# Patient Record
Sex: Male | Born: 1955 | Race: White | Hispanic: No | Marital: Married | Smoking: Current every day smoker
Health system: Southern US, Community
[De-identification: ages and names within clinical notes are randomized; demographics above are authoritative.]

## PROBLEM LIST (undated history)

## (undated) DIAGNOSIS — I10 Essential (primary) hypertension: Secondary | ICD-10-CM

## (undated) DIAGNOSIS — Z992 Dependence on renal dialysis: Secondary | ICD-10-CM

## (undated) DIAGNOSIS — C329 Malignant neoplasm of larynx, unspecified: Secondary | ICD-10-CM

## (undated) DIAGNOSIS — N186 End stage renal disease: Secondary | ICD-10-CM

## (undated) DIAGNOSIS — J449 Chronic obstructive pulmonary disease, unspecified: Secondary | ICD-10-CM

---

## 2018-04-19 ENCOUNTER — Inpatient Hospital Stay
Admission: AD | Admit: 2018-04-19 | Discharge: 2018-04-19 | Payer: Self-pay | Source: Ambulatory Visit | Attending: Internal Medicine | Admitting: Internal Medicine

## 2018-04-19 ENCOUNTER — Inpatient Hospital Stay (HOSPITAL_COMMUNITY)
Admission: EM | Admit: 2018-04-19 | Discharge: 2018-04-22 | DRG: 004 | Disposition: A | Discharge status: Other Acute Inpatient Hospital | Payer: Medicare Other | Source: Other Acute Inpatient Hospital | Attending: Pulmonary Disease | Admitting: Pulmonary Disease

## 2018-04-19 ENCOUNTER — Other Ambulatory Visit (HOSPITAL_COMMUNITY): Payer: Self-pay

## 2018-04-19 ENCOUNTER — Encounter (HOSPITAL_COMMUNITY)
Admission: EM | Disposition: A | Discharge status: Nursing Home (Includes ICF) | Payer: Self-pay | Source: Other Acute Inpatient Hospital | Attending: Critical Care Medicine

## 2018-04-19 ENCOUNTER — Emergency Department (HOSPITAL_COMMUNITY): Payer: Medicare Other | Admitting: Anesthesiology

## 2018-04-19 DIAGNOSIS — L8990 Pressure ulcer of unspecified site, unspecified stage: Secondary | ICD-10-CM

## 2018-04-19 DIAGNOSIS — E872 Acidosis: Secondary | ICD-10-CM | POA: Diagnosis present

## 2018-04-19 DIAGNOSIS — D631 Anemia in chronic kidney disease: Secondary | ICD-10-CM | POA: Diagnosis present

## 2018-04-19 DIAGNOSIS — G9340 Encephalopathy, unspecified: Secondary | ICD-10-CM

## 2018-04-19 DIAGNOSIS — Z992 Dependence on renal dialysis: Secondary | ICD-10-CM

## 2018-04-19 DIAGNOSIS — F17210 Nicotine dependence, cigarettes, uncomplicated: Secondary | ICD-10-CM | POA: Diagnosis present

## 2018-04-19 DIAGNOSIS — J440 Chronic obstructive pulmonary disease with acute lower respiratory infection: Secondary | ICD-10-CM | POA: Diagnosis present

## 2018-04-19 DIAGNOSIS — Z8521 Personal history of malignant neoplasm of larynx: Secondary | ICD-10-CM

## 2018-04-19 DIAGNOSIS — J441 Chronic obstructive pulmonary disease with (acute) exacerbation: Secondary | ICD-10-CM | POA: Diagnosis present

## 2018-04-19 DIAGNOSIS — Z781 Physical restraint status: Secondary | ICD-10-CM

## 2018-04-19 DIAGNOSIS — J42 Unspecified chronic bronchitis: Secondary | ICD-10-CM

## 2018-04-19 DIAGNOSIS — J9600 Acute respiratory failure, unspecified whether with hypoxia or hypercapnia: Secondary | ICD-10-CM

## 2018-04-19 DIAGNOSIS — Z882 Allergy status to sulfonamides status: Secondary | ICD-10-CM

## 2018-04-19 DIAGNOSIS — J9621 Acute and chronic respiratory failure with hypoxia: Principal | ICD-10-CM | POA: Diagnosis present

## 2018-04-19 DIAGNOSIS — J9622 Acute and chronic respiratory failure with hypercapnia: Secondary | ICD-10-CM | POA: Diagnosis present

## 2018-04-19 DIAGNOSIS — R410 Disorientation, unspecified: Secondary | ICD-10-CM | POA: Diagnosis not present

## 2018-04-19 DIAGNOSIS — N186 End stage renal disease: Secondary | ICD-10-CM

## 2018-04-19 DIAGNOSIS — R1310 Dysphagia, unspecified: Secondary | ICD-10-CM | POA: Diagnosis present

## 2018-04-19 DIAGNOSIS — J9601 Acute respiratory failure with hypoxia: Secondary | ICD-10-CM | POA: Diagnosis present

## 2018-04-19 DIAGNOSIS — Z931 Gastrostomy status: Secondary | ICD-10-CM

## 2018-04-19 DIAGNOSIS — J9690 Respiratory failure, unspecified, unspecified whether with hypoxia or hypercapnia: Secondary | ICD-10-CM | POA: Diagnosis present

## 2018-04-19 DIAGNOSIS — I4891 Unspecified atrial fibrillation: Secondary | ICD-10-CM | POA: Diagnosis present

## 2018-04-19 DIAGNOSIS — J9602 Acute respiratory failure with hypercapnia: Secondary | ICD-10-CM

## 2018-04-19 DIAGNOSIS — Z888 Allergy status to other drugs, medicaments and biological substances status: Secondary | ICD-10-CM

## 2018-04-19 DIAGNOSIS — Z930 Tracheostomy status: Secondary | ICD-10-CM

## 2018-04-19 DIAGNOSIS — B9789 Other viral agents as the cause of diseases classified elsewhere: Secondary | ICD-10-CM | POA: Diagnosis present

## 2018-04-19 DIAGNOSIS — Z923 Personal history of irradiation: Secondary | ICD-10-CM

## 2018-04-19 HISTORY — DX: Malignant neoplasm of larynx, unspecified: C32.9

## 2018-04-19 HISTORY — DX: Chronic obstructive pulmonary disease, unspecified: J44.9

## 2018-04-19 HISTORY — DX: End stage renal disease: N18.6

## 2018-04-19 HISTORY — DX: Essential (primary) hypertension: I10

## 2018-04-19 HISTORY — PX: TRACHEOSTOMY TUBE PLACEMENT: SHX814

## 2018-04-19 HISTORY — DX: Dependence on renal dialysis: Z99.2

## 2018-04-19 LAB — CBC WITH DIFFERENTIAL/PLATELET
ABS IMMATURE GRANULOCYTES: 0.62 10*3/uL — AB (ref 0.00–0.07)
BASOS PCT: 0 %
Basophils Absolute: 0 10*3/uL (ref 0.0–0.1)
Eosinophils Absolute: 0 10*3/uL (ref 0.0–0.5)
Eosinophils Relative: 0 %
HCT: 40 % (ref 39.0–52.0)
Hemoglobin: 12.6 g/dL — ABNORMAL LOW (ref 13.0–17.0)
IMMATURE GRANULOCYTES: 3 %
LYMPHS PCT: 2 %
Lymphs Abs: 0.5 10*3/uL — ABNORMAL LOW (ref 0.7–4.0)
MCH: 29.1 pg (ref 26.0–34.0)
MCHC: 31.5 g/dL (ref 30.0–36.0)
MCV: 92.4 fL (ref 80.0–100.0)
MONOS PCT: 6 %
Monocytes Absolute: 1.4 10*3/uL — ABNORMAL HIGH (ref 0.1–1.0)
NEUTROS ABS: 21.3 10*3/uL — AB (ref 1.7–7.7)
Neutrophils Relative %: 89 %
PLATELETS: 200 10*3/uL (ref 150–400)
RBC: 4.33 MIL/uL (ref 4.22–5.81)
RDW: 12.6 % (ref 11.5–15.5)
WBC: 23.9 10*3/uL — AB (ref 4.0–10.5)
nRBC: 0 % (ref 0.0–0.2)

## 2018-04-19 LAB — BLOOD GAS, ARTERIAL
Acid-base deficit: 2.8 mmol/L — ABNORMAL HIGH (ref 0.0–2.0)
Bicarbonate: 21.9 mmol/L (ref 20.0–28.0)
O2 CONTENT: 3 L/min
O2 Saturation: 93.5 %
PATIENT TEMPERATURE: 98.6
pCO2 arterial: 40.6 mmHg (ref 32.0–48.0)
pH, Arterial: 7.351 (ref 7.350–7.450)
pO2, Arterial: 78.3 mmHg — ABNORMAL LOW (ref 83.0–108.0)

## 2018-04-19 LAB — PROTIME-INR
INR: 1.08
PROTHROMBIN TIME: 13.9 s (ref 11.4–15.2)

## 2018-04-19 LAB — COMPREHENSIVE METABOLIC PANEL
ALT: 81 U/L — AB (ref 0–44)
AST: 34 U/L (ref 15–41)
Albumin: 2.6 g/dL — ABNORMAL LOW (ref 3.5–5.0)
Alkaline Phosphatase: 74 U/L (ref 38–126)
Anion gap: 19 — ABNORMAL HIGH (ref 5–15)
BUN: 160 mg/dL — ABNORMAL HIGH (ref 6–20)
CALCIUM: 8.7 mg/dL — AB (ref 8.9–10.3)
CHLORIDE: 95 mmol/L — AB (ref 98–111)
CO2: 22 mmol/L (ref 22–32)
CREATININE: 8.58 mg/dL — AB (ref 0.61–1.24)
GFR calc Af Amer: 7 mL/min — ABNORMAL LOW (ref >60)
GFR calc non Af Amer: 6 mL/min — ABNORMAL LOW (ref >60)
Glucose, Bld: 93 mg/dL (ref 70–99)
Potassium: 5.1 mmol/L (ref 3.5–5.1)
Sodium: 136 mmol/L (ref 135–145)
Total Bilirubin: 1.2 mg/dL (ref 0.3–1.2)
Total Protein: 6.1 g/dL — ABNORMAL LOW (ref 6.5–8.1)

## 2018-04-19 SURGERY — CREATION, TRACHEOSTOMY
Anesthesia: Monitor Anesthesia Care

## 2018-04-19 MED ORDER — KETAMINE HCL 50 MG/5ML IJ SOSY
80.0000 mg | PREFILLED_SYRINGE | Freq: Once | INTRAMUSCULAR | Status: DC
  Filled 2018-04-19: qty 10

## 2018-04-19 MED ORDER — LIDOCAINE HCL (PF) 1 % IJ SOLN
INTRAMUSCULAR | Status: AC
  Filled 2018-04-19: qty 30

## 2018-04-19 MED ORDER — LIDOCAINE-EPINEPHRINE 1 %-1:100000 IJ SOLN
INTRAMUSCULAR | Status: AC
  Filled 2018-04-19: qty 1

## 2018-04-19 MED ORDER — IOPAMIDOL (ISOVUE-300) INJECTION 61%
INTRAVENOUS | Status: AC
  Administered 2018-04-19: 50 mL via GASTROSTOMY
  Filled 2018-04-19: qty 50

## 2018-04-19 SURGICAL SUPPLY — 46 items
BLADE CLIPPER SURG (BLADE) IMPLANT
BLADE SURG 15 STRL LF DISP TIS (BLADE) IMPLANT
BLADE SURG 15 STRL SS (BLADE)
CANISTER SUCT 3000ML PPV (MISCELLANEOUS) ×2 IMPLANT
CLEANER TIP ELECTROSURG 2X2 (MISCELLANEOUS) ×2 IMPLANT
COVER SURGICAL LIGHT HANDLE (MISCELLANEOUS) ×2 IMPLANT
COVER WAND RF STERILE (DRAPES) ×2 IMPLANT
CRADLE DONUT ADULT HEAD (MISCELLANEOUS) IMPLANT
DECANTER SPIKE VIAL GLASS SM (MISCELLANEOUS) IMPLANT
DRAPE HALF SHEET 40X57 (DRAPES) IMPLANT
DRSG ADAPTIC 3X8 NADH LF (GAUZE/BANDAGES/DRESSINGS) ×2 IMPLANT
ELECT COATED BLADE 2.86 ST (ELECTRODE) ×2 IMPLANT
ELECT REM PT RETURN 9FT ADLT (ELECTROSURGICAL) ×2
ELECTRODE REM PT RTRN 9FT ADLT (ELECTROSURGICAL) ×1 IMPLANT
GAUZE 4X4 16PLY RFD (DISPOSABLE) ×2 IMPLANT
GAUZE PETROLATUM 1 X8 (GAUZE/BANDAGES/DRESSINGS) IMPLANT
GLOVE BIO SURGEON STRL SZ 6.5 (GLOVE) ×6 IMPLANT
GLOVE BIO SURGEON STRL SZ7 (GLOVE) ×2 IMPLANT
GLOVE BIO SURGEON STRL SZ7.5 (GLOVE) ×2 IMPLANT
GLOVE BIOGEL PI IND STRL 6.5 (GLOVE) ×2 IMPLANT
GLOVE BIOGEL PI INDICATOR 6.5 (GLOVE) ×2
GOWN STRL REUS W/ TWL LRG LVL3 (GOWN DISPOSABLE) ×6 IMPLANT
GOWN STRL REUS W/TWL LRG LVL3 (GOWN DISPOSABLE) ×6
HOLDER TRACH TUBE VELCRO 19.5 (MISCELLANEOUS) ×2 IMPLANT
KIT BASIN OR (CUSTOM PROCEDURE TRAY) ×2 IMPLANT
KIT SUCTION CATH 14FR (SUCTIONS) IMPLANT
KIT TURNOVER KIT B (KITS) ×2 IMPLANT
NEEDLE HYPO 25GX1X1/2 BEV (NEEDLE) ×4 IMPLANT
NS IRRIG 1000ML POUR BTL (IV SOLUTION) ×2 IMPLANT
PAD ARMBOARD 7.5X6 YLW CONV (MISCELLANEOUS) ×4 IMPLANT
PENCIL BUTTON HOLSTER BLD 10FT (ELECTRODE) ×4 IMPLANT
SPONGE DRAIN TRACH 4X4 STRL 2S (GAUZE/BANDAGES/DRESSINGS) ×2 IMPLANT
SPONGE INTESTINAL PEANUT (DISPOSABLE) IMPLANT
SUT SILK 0 FSL (SUTURE) ×4 IMPLANT
SUT SILK 2 0 (SUTURE) ×1
SUT SILK 2 0 REEL (SUTURE) IMPLANT
SUT SILK 2 0 SH CR/8 (SUTURE) IMPLANT
SUT SILK 2-0 18XBRD TIE 12 (SUTURE) ×1 IMPLANT
SUT SILK 3 0 SH 30 (SUTURE) ×2 IMPLANT
SUT VIC AB 2-0 FS1 27 (SUTURE) IMPLANT
SYR 20ML ECCENTRIC (SYRINGE) ×2 IMPLANT
SYR BULB 3OZ (MISCELLANEOUS) ×2 IMPLANT
SYR CONTROL 10ML LL (SYRINGE) ×6 IMPLANT
TRAY ENT MC OR (CUSTOM PROCEDURE TRAY) ×2 IMPLANT
TUBE CONNECTING 12X1/4 (SUCTIONS) ×2 IMPLANT
WATER STERILE IRR 1000ML POUR (IV SOLUTION) ×2 IMPLANT

## 2018-04-19 NOTE — ED Provider Notes (Signed)
[facility] EMERGENCY DEPARTMENT Provider Note   CSN: [id number] Arrival date & time: 04/19/18  2325     History   Chief Complaint No chief complaint on file.   HPI Jimmy Martin is a 62 y.o. male.  HPI  This is a 62 year old male with a history of respiratory failure, laryngeal cancer, COPD based on chart review who presents with respiratory distress.  Patient was sent down to the ED from [facility].  Concern for worsening dyspnea and shortness of breath.  Anesthesia was called to the bedside but felt that intubation at the bedside without ENT back-up for possible chronic or tracheostomy was unfavorable.  Patient was sent to the ED for evaluation.  ENT and trauma surgery are at the bedside at this time.  Patient is arousable.  Current O2 sats are 100%.  He does endorse that he would want intubation.  I have reviewed his record and he was a transfer from an outside hospital.  He was intubated for weeks ago at the outside hospital.  Patient is unable to contribute to history at this time.  Level 5 caveat.  Past medical history -COPD, laryngeal cancer    There are no active problems to display for this patient.  Unknown medicines  Home Medications    Prior to Admission medications   Not on File    Family History No family history on file.  Social History Social History   Tobacco Use  . Smoking status: Not on file  Substance Use Topics  . Alcohol use: Not on file  . Drug use: Not on file     Allergies   Patient has no allergy information on record.   Review of Systems Review of Systems  Unable to perform ROS: Acuity of condition     Physical Exam Updated Vital Signs There were no vitals taken for this visit.  Physical Exam  Constitutional:  Chronically ill-appearing  HENT:  Head: Normocephalic and atraumatic.  Eyes: Pupils are equal, round, and reactive to light.  Cardiovascular: Normal rate, regular rhythm and normal heart sounds.    Pulmonary/Chest: He is in respiratory distress. He has no wheezes.  Increased respiratory rate, somnolent but arousable, minimal air movement  Abdominal: Soft. There is no tenderness.  Neurological:  Somnolent but arousable  Skin: Skin is warm and dry.  Psychiatric: He has a normal mood and affect.  Nursing note and vitals reviewed.    ED Treatments / Results  Labs (all labs ordered are listed, but only abnormal results are displayed) Labs Reviewed - No data to display  EKG None  Radiology Dg Chest [facility] 1 View  Result Date: 04/19/2018 CLINICAL DATA:  Dialysis catheter. EXAM: PORTABLE ABDOMEN - 1 VIEW; PORTABLE CHEST - 1 VIEW COMPARISON:  None. FINDINGS: Cardiomediastinal silhouette is normal. Mildly calcified aortic arch. No pleural effusions or focal consolidations. Tunneled dialysis catheter via RIGHT internal jugular venous approach with distal tip projecting in distal superior vena cava. Tiny suspected granulomas. Trachea projects midline and there is no pneumothorax. Soft tissue planes and included osseous structures are non-suspicious. Contrast injected into gastrostomy tube with contrast distended gastric antrum, no extraluminal contrast. Included bowel gas pattern is nondilated and nonobstructive. Bilateral nephroureteral stents. No mass effect or nephrolithiasis. Included osseous structures are non suspicious. IMPRESSION: 1. No acute cardiopulmonary process. Dialysis catheter tip projects in distal superior vena cava. 2. Intraluminal gastrostomy tube.  Bilateral nephroureteral stents. Electronically Signed   By: Elon Alas M.D.   On: 04/19/2018  19:01   Dg Abd Portable 1v  Result Date: 04/19/2018 CLINICAL DATA:  Dialysis catheter. EXAM: PORTABLE ABDOMEN - 1 VIEW; PORTABLE CHEST - 1 VIEW COMPARISON:  None. FINDINGS: Cardiomediastinal silhouette is normal. Mildly calcified aortic arch. No pleural effusions or focal consolidations. Tunneled dialysis catheter via RIGHT  internal jugular venous approach with distal tip projecting in distal superior vena cava. Tiny suspected granulomas. Trachea projects midline and there is no pneumothorax. Soft tissue planes and included osseous structures are non-suspicious. Contrast injected into gastrostomy tube with contrast distended gastric antrum, no extraluminal contrast. Included bowel gas pattern is nondilated and nonobstructive. Bilateral nephroureteral stents. No mass effect or nephrolithiasis. Included osseous structures are non suspicious. IMPRESSION: 1. No acute cardiopulmonary process. Dialysis catheter tip projects in distal superior vena cava. 2. Intraluminal gastrostomy tube.  Bilateral nephroureteral stents. Electronically Signed   By: Elon Alas M.D.   On: 04/19/2018 19:01    Procedures Procedures (including critical care time)  CRITICAL CARE Performed by: Merryl Hacker   Total critical care time: 31 minutes  Critical care time was exclusive of separately billable procedures and treating other patients.  Critical care was necessary to treat or prevent imminent or life-threatening deterioration.  Critical care was time spent personally by me on the following activities: development of treatment plan with patient and/or surrogate as well as nursing, discussions with consultants, evaluation of patient's response to treatment, examination of patient, obtaining history from patient or surrogate, ordering and performing treatments and interventions, ordering and review of laboratory studies, ordering and review of radiographic studies, pulse oximetry and re-evaluation of patient's condition.   Medications Ordered in ED Medications  ketamine 50 mg in normal saline 5 mL (10 mg/mL) syringe (has no administration in time range)     Initial Impression / Assessment and Plan / ED Course  I have reviewed the triage vital signs and the nursing notes.  Pertinent labs & imaging results that were available  during my care of the patient were reviewed by me and considered in my medical decision making (see chart for details).     [facility] with concerns for needing airway.  Currently his O2 sats are 100%.  He is somnolent but arousable.  ENT and trauma or at the bedside for assessment.  Anesthesia is also at the bedside.  Decision was made to take patient emergently to the OR for tracheostomy given his history of laryngeal cancer.  Final Clinical Impressions(s) / ED Diagnoses   Final diagnoses:  Acute respiratory failure, unspecified whether with hypoxia or hypercapnia [facility])    ED Discharge Orders    None       Merryl Hacker, MD 04/19/18 2345

## 2018-04-20 ENCOUNTER — Inpatient Hospital Stay (HOSPITAL_COMMUNITY): Payer: Medicare Other

## 2018-04-20 ENCOUNTER — Encounter (HOSPITAL_COMMUNITY): Payer: Self-pay | Admitting: Pulmonary Disease

## 2018-04-20 DIAGNOSIS — J9601 Acute respiratory failure with hypoxia: Secondary | ICD-10-CM | POA: Diagnosis present

## 2018-04-20 DIAGNOSIS — R1310 Dysphagia, unspecified: Secondary | ICD-10-CM | POA: Diagnosis present

## 2018-04-20 DIAGNOSIS — Z8521 Personal history of malignant neoplasm of larynx: Secondary | ICD-10-CM | POA: Diagnosis not present

## 2018-04-20 DIAGNOSIS — B9789 Other viral agents as the cause of diseases classified elsewhere: Secondary | ICD-10-CM | POA: Diagnosis present

## 2018-04-20 DIAGNOSIS — J9622 Acute and chronic respiratory failure with hypercapnia: Secondary | ICD-10-CM | POA: Diagnosis present

## 2018-04-20 DIAGNOSIS — R410 Disorientation, unspecified: Secondary | ICD-10-CM | POA: Diagnosis not present

## 2018-04-20 DIAGNOSIS — D631 Anemia in chronic kidney disease: Secondary | ICD-10-CM | POA: Diagnosis present

## 2018-04-20 DIAGNOSIS — E872 Acidosis: Secondary | ICD-10-CM | POA: Diagnosis present

## 2018-04-20 DIAGNOSIS — Z930 Tracheostomy status: Secondary | ICD-10-CM | POA: Diagnosis not present

## 2018-04-20 DIAGNOSIS — G9341 Metabolic encephalopathy: Secondary | ICD-10-CM | POA: Diagnosis not present

## 2018-04-20 DIAGNOSIS — J189 Pneumonia, unspecified organism: Secondary | ICD-10-CM | POA: Diagnosis not present

## 2018-04-20 DIAGNOSIS — Z882 Allergy status to sulfonamides status: Secondary | ICD-10-CM | POA: Diagnosis not present

## 2018-04-20 DIAGNOSIS — Z931 Gastrostomy status: Secondary | ICD-10-CM | POA: Diagnosis not present

## 2018-04-20 DIAGNOSIS — J9600 Acute respiratory failure, unspecified whether with hypoxia or hypercapnia: Secondary | ICD-10-CM | POA: Diagnosis present

## 2018-04-20 DIAGNOSIS — I4891 Unspecified atrial fibrillation: Secondary | ICD-10-CM | POA: Diagnosis present

## 2018-04-20 DIAGNOSIS — J9621 Acute and chronic respiratory failure with hypoxia: Secondary | ICD-10-CM | POA: Diagnosis present

## 2018-04-20 DIAGNOSIS — L8990 Pressure ulcer of unspecified site, unspecified stage: Secondary | ICD-10-CM

## 2018-04-20 DIAGNOSIS — J9602 Acute respiratory failure with hypercapnia: Secondary | ICD-10-CM

## 2018-04-20 DIAGNOSIS — J9690 Respiratory failure, unspecified, unspecified whether with hypoxia or hypercapnia: Secondary | ICD-10-CM | POA: Diagnosis present

## 2018-04-20 DIAGNOSIS — Z9911 Dependence on respirator [ventilator] status: Secondary | ICD-10-CM | POA: Diagnosis not present

## 2018-04-20 DIAGNOSIS — J440 Chronic obstructive pulmonary disease with acute lower respiratory infection: Secondary | ICD-10-CM | POA: Diagnosis present

## 2018-04-20 DIAGNOSIS — Z781 Physical restraint status: Secondary | ICD-10-CM | POA: Diagnosis not present

## 2018-04-20 DIAGNOSIS — Z992 Dependence on renal dialysis: Secondary | ICD-10-CM | POA: Diagnosis not present

## 2018-04-20 DIAGNOSIS — G9340 Encephalopathy, unspecified: Secondary | ICD-10-CM | POA: Diagnosis not present

## 2018-04-20 DIAGNOSIS — J441 Chronic obstructive pulmonary disease with (acute) exacerbation: Secondary | ICD-10-CM | POA: Diagnosis present

## 2018-04-20 DIAGNOSIS — Z923 Personal history of irradiation: Secondary | ICD-10-CM | POA: Diagnosis not present

## 2018-04-20 DIAGNOSIS — F17210 Nicotine dependence, cigarettes, uncomplicated: Secondary | ICD-10-CM | POA: Diagnosis present

## 2018-04-20 DIAGNOSIS — Z888 Allergy status to other drugs, medicaments and biological substances status: Secondary | ICD-10-CM | POA: Diagnosis not present

## 2018-04-20 DIAGNOSIS — L7682 Other postprocedural complications of skin and subcutaneous tissue: Secondary | ICD-10-CM | POA: Diagnosis not present

## 2018-04-20 DIAGNOSIS — N186 End stage renal disease: Secondary | ICD-10-CM | POA: Diagnosis present

## 2018-04-20 LAB — RESPIRATORY PANEL BY PCR

## 2018-04-20 LAB — LACTIC ACID, PLASMA
LACTIC ACID, VENOUS: 1.1 mmol/L (ref 0.5–1.9)
Lactic Acid, Venous: 0.8 mmol/L (ref 0.5–1.9)

## 2018-04-20 LAB — MRSA PCR SCREENING: MRSA by PCR: NEGATIVE

## 2018-04-20 LAB — GLUCOSE, CAPILLARY
GLUCOSE-CAPILLARY: 103 mg/dL — AB (ref 70–99)
GLUCOSE-CAPILLARY: 110 mg/dL — AB (ref 70–99)
GLUCOSE-CAPILLARY: 129 mg/dL — AB (ref 70–99)
GLUCOSE-CAPILLARY: 132 mg/dL — AB (ref 70–99)
GLUCOSE-CAPILLARY: 96 mg/dL (ref 70–99)
Glucose-Capillary: 93 mg/dL (ref 70–99)
Glucose-Capillary: 95 mg/dL (ref 70–99)

## 2018-04-20 LAB — CBC
HEMATOCRIT: 38.8 % — AB (ref 39.0–52.0)
HEMATOCRIT: 37 % — AB (ref 39.0–52.0)
HEMOGLOBIN: 12.1 g/dL — AB (ref 13.0–17.0)
HEMOGLOBIN: 11.5 g/dL — AB (ref 13.0–17.0)
MCH: 29.4 pg (ref 26.0–34.0)
MCH: 29.5 pg (ref 26.0–34.0)
MCHC: 31.2 g/dL (ref 30.0–36.0)
MCHC: 31.1 g/dL (ref 30.0–36.0)
MCV: 94.2 fL (ref 80.0–100.0)
MCV: 94.9 fL (ref 80.0–100.0)
NRBC: 0 % (ref 0.0–0.2)
Platelets: 176 10*3/uL (ref 150–400)
Platelets: 152 10*3/uL (ref 150–400)
RBC: 4.12 MIL/uL — ABNORMAL LOW (ref 4.22–5.81)
RBC: 3.9 MIL/uL — AB (ref 4.22–5.81)
RDW: 12.8 % (ref 11.5–15.5)
RDW: 12.8 % (ref 11.5–15.5)
WBC: 22.4 10*3/uL — ABNORMAL HIGH (ref 4.0–10.5)
WBC: 18.8 10*3/uL — ABNORMAL HIGH (ref 4.0–10.5)
nRBC: 0 % (ref 0.0–0.2)

## 2018-04-20 LAB — BASIC METABOLIC PANEL
Anion gap: 21 — ABNORMAL HIGH (ref 5–15)
BUN: 183 mg/dL — ABNORMAL HIGH (ref 6–20)
CALCIUM: 8.5 mg/dL — AB (ref 8.9–10.3)
CO2: 20 mmol/L — AB (ref 22–32)
CREATININE: 9.35 mg/dL — AB (ref 0.61–1.24)
Chloride: 94 mmol/L — ABNORMAL LOW (ref 98–111)
GFR calc non Af Amer: 5 mL/min — ABNORMAL LOW (ref >60)
GFR, EST AFRICAN AMERICAN: 6 mL/min — AB (ref >60)
GLUCOSE: 117 mg/dL — AB (ref 70–99)
Potassium: 6.2 mmol/L — ABNORMAL HIGH (ref 3.5–5.1)
Sodium: 135 mmol/L (ref 135–145)

## 2018-04-20 LAB — BLOOD GAS, ARTERIAL
ACID-BASE DEFICIT: 6.2 mmol/L — AB (ref 0.0–2.0)
Bicarbonate: 20.3 mmol/L (ref 20.0–28.0)
Drawn by: 51155
FIO2: 80
O2 SAT: 98.8 %
PATIENT TEMPERATURE: 98.6
PEEP: 5 cmH2O
PH ART: 7.215 — AB (ref 7.350–7.450)
RATE: 14 resp/min
VT: 520 mL
pCO2 arterial: 52.2 mmHg — ABNORMAL HIGH (ref 32.0–48.0)
pO2, Arterial: 342 mmHg — ABNORMAL HIGH (ref 83.0–108.0)

## 2018-04-20 LAB — POCT I-STAT 3, ART BLOOD GAS (G3+)
Acid-base deficit: 5 mmol/L — ABNORMAL HIGH (ref 0.0–2.0)
BICARBONATE: 22.1 mmol/L (ref 20.0–28.0)
O2 Saturation: 100 %
TCO2: 24 mmol/L (ref 22–32)
pCO2 arterial: 49.2 mmHg — ABNORMAL HIGH (ref 32.0–48.0)
pH, Arterial: 7.261 — ABNORMAL LOW (ref 7.350–7.450)
pO2, Arterial: 357 mmHg — ABNORMAL HIGH (ref 83.0–108.0)

## 2018-04-20 LAB — MAGNESIUM
MAGNESIUM: 3.1 mg/dL — AB (ref 1.7–2.4)
Magnesium: 3.4 mg/dL — ABNORMAL HIGH (ref 1.7–2.4)

## 2018-04-20 LAB — CREATININE, SERUM
Creatinine, Ser: 9.16 mg/dL — ABNORMAL HIGH (ref 0.61–1.24)
GFR calc Af Amer: 6 mL/min — ABNORMAL LOW (ref >60)
GFR calc non Af Amer: 5 mL/min — ABNORMAL LOW (ref >60)

## 2018-04-20 LAB — PHOSPHORUS
PHOSPHORUS: 18 mg/dL — AB (ref 2.5–4.6)
PHOSPHORUS: 18.6 mg/dL — AB (ref 2.5–4.6)

## 2018-04-20 LAB — HIV ANTIBODY (ROUTINE TESTING W REFLEX): HIV Screen 4th Generation wRfx: NONREACTIVE

## 2018-04-20 MED ORDER — LIDOCAINE-EPINEPHRINE 1 %-1:100000 IJ SOLN
INTRAMUSCULAR | Status: DC | PRN
  Administered 2018-04-20: 7 mL

## 2018-04-20 MED ORDER — MIDAZOLAM HCL 2 MG/2ML IJ SOLN
1.0000 mg | Freq: Once | INTRAMUSCULAR | Status: AC
  Administered 2018-04-20: 1 mg via INTRAVENOUS

## 2018-04-20 MED ORDER — PROPOFOL 10 MG/ML IV BOLUS
INTRAVENOUS | Status: DC | PRN
  Administered 2018-04-20: 140 mg via INTRAVENOUS

## 2018-04-20 MED ORDER — FAMOTIDINE IN NACL 20-0.9 MG/50ML-% IV SOLN
20.0000 mg | Freq: Two times a day (BID) | INTRAVENOUS | Status: DC
  Administered 2018-04-20 (×2): 20 mg via INTRAVENOUS
  Filled 2018-04-20 (×2): qty 50

## 2018-04-20 MED ORDER — ONDANSETRON HCL 4 MG/2ML IJ SOLN: 4.0000 mg | Freq: Four times a day (QID) | INTRAMUSCULAR | Status: DC | PRN

## 2018-04-20 MED ORDER — BUDESONIDE 0.25 MG/2ML IN SUSP: 0.2500 mg | Freq: Two times a day (BID) | RESPIRATORY_TRACT | Status: DC

## 2018-04-20 MED ORDER — NICOTINE 7 MG/24HR TD PT24
7.0000 mg | MEDICATED_PATCH | Freq: Every day | TRANSDERMAL | Status: DC
  Filled 2018-04-20: qty 1

## 2018-04-20 MED ORDER — DEXMEDETOMIDINE HCL IN NACL 200 MCG/50ML IV SOLN
0.4000 ug/kg/h | INTRAVENOUS | Status: DC
  Administered 2018-04-20: 0.8 ug/kg/h via INTRAVENOUS
  Administered 2018-04-20: 0.4 ug/kg/h via INTRAVENOUS
  Administered 2018-04-20: 0.8 ug/kg/h via INTRAVENOUS
  Administered 2018-04-21: 0.9 ug/kg/h via INTRAVENOUS
  Filled 2018-04-20 (×5): qty 50

## 2018-04-20 MED ORDER — FENTANYL CITRATE (PF) 100 MCG/2ML IJ SOLN: 100.0000 ug | INTRAMUSCULAR | Status: DC | PRN

## 2018-04-20 MED ORDER — SODIUM CHLORIDE 0.9 % IV SOLN
INTRAVENOUS | Status: DC | PRN
  Administered 2018-04-19: via INTRAVENOUS

## 2018-04-20 MED ORDER — METHYLPREDNISOLONE SODIUM SUCC 40 MG IJ SOLR
30.0000 mg | Freq: Two times a day (BID) | INTRAMUSCULAR | Status: DC
  Administered 2018-04-20 – 2018-04-22 (×6): 30 mg via INTRAVENOUS
  Filled 2018-04-20 (×6): qty 1

## 2018-04-20 MED ORDER — FAMOTIDINE IN NACL 20-0.9 MG/50ML-% IV SOLN
20.0000 mg | INTRAVENOUS | Status: DC
  Administered 2018-04-21 – 2018-04-22 (×2): 20 mg via INTRAVENOUS
  Filled 2018-04-20 (×2): qty 50

## 2018-04-20 MED ORDER — PRO-STAT SUGAR FREE PO LIQD
30.0000 mL | Freq: Two times a day (BID) | ORAL | Status: DC
  Administered 2018-04-20: 30 mL
  Filled 2018-04-20: qty 30

## 2018-04-20 MED ORDER — MIDAZOLAM HCL 2 MG/2ML IJ SOLN
2.0000 mg | Freq: Once | INTRAMUSCULAR | Status: AC
  Administered 2018-04-20: 2 mg via INTRAVENOUS
  Filled 2018-04-20: qty 2

## 2018-04-20 MED ORDER — NEPRO/CARBSTEADY PO LIQD
1000.0000 mL | ORAL | Status: DC
  Administered 2018-04-20 – 2018-04-22 (×3): 1000 mL via ORAL
  Filled 2018-04-20 (×4): qty 1000

## 2018-04-20 MED ORDER — MIDAZOLAM HCL 2 MG/2ML IJ SOLN
INTRAMUSCULAR | Status: AC
  Administered 2018-04-20: 2 mg via INTRAVENOUS
  Filled 2018-04-20: qty 2

## 2018-04-20 MED ORDER — ORAL CARE MOUTH RINSE
15.0000 mL | OROMUCOSAL | Status: DC
  Administered 2018-04-20 – 2018-04-22 (×25): 15 mL via OROMUCOSAL

## 2018-04-20 MED ORDER — PHENYLEPHRINE HCL 10 MG/ML IJ SOLN
INTRAMUSCULAR | Status: DC | PRN
  Administered 2018-04-20 (×2): 40 ug via INTRAVENOUS
  Administered 2018-04-20 (×2): 80 ug via INTRAVENOUS

## 2018-04-20 MED ORDER — SODIUM CHLORIDE 0.9 % IV SOLN
INTRAVENOUS | Status: DC
  Administered 2018-04-20 – 2018-04-22 (×2): via INTRAVENOUS

## 2018-04-20 MED ORDER — SODIUM CHLORIDE 0.9 % IV SOLN
1.0000 g | INTRAVENOUS | Status: DC
  Administered 2018-04-20 – 2018-04-21 (×2): 1 g via INTRAVENOUS
  Filled 2018-04-20 (×3): qty 1

## 2018-04-20 MED ORDER — ARFORMOTEROL TARTRATE 15 MCG/2ML IN NEBU
15.0000 ug | INHALATION_SOLUTION | Freq: Two times a day (BID) | RESPIRATORY_TRACT | Status: DC
  Administered 2018-04-20 – 2018-04-22 (×5): 15 ug via RESPIRATORY_TRACT
  Filled 2018-04-20 (×5): qty 2

## 2018-04-20 MED ORDER — MIDAZOLAM HCL 5 MG/5ML IJ SOLN
INTRAMUSCULAR | Status: DC | PRN
  Administered 2018-04-20: 0.5 mg via INTRAVENOUS
  Administered 2018-04-20: .5 mg via INTRAVENOUS
  Administered 2018-04-20 (×2): 0.5 mg via INTRAVENOUS

## 2018-04-20 MED ORDER — NICOTINE 21 MG/24HR TD PT24
21.0000 mg | MEDICATED_PATCH | Freq: Every day | TRANSDERMAL | Status: DC
  Administered 2018-04-20 – 2018-04-22 (×3): 21 mg via TRANSDERMAL
  Filled 2018-04-20 (×3): qty 1

## 2018-04-20 MED ORDER — ASPIRIN 300 MG RE SUPP
300.0000 mg | RECTAL | Status: AC
  Administered 2018-04-20: 300 mg via RECTAL
  Filled 2018-04-20: qty 1

## 2018-04-20 MED ORDER — MIDAZOLAM HCL 2 MG/2ML IJ SOLN
INTRAMUSCULAR | Status: AC
  Filled 2018-04-20: qty 2

## 2018-04-20 MED ORDER — MIDAZOLAM HCL 2 MG/2ML IJ SOLN
INTRAMUSCULAR | Status: AC
  Administered 2018-04-20: 1 mg via INTRAVENOUS
  Filled 2018-04-20: qty 2

## 2018-04-20 MED ORDER — BUDESONIDE 0.5 MG/2ML IN SUSP
0.5000 mg | Freq: Two times a day (BID) | RESPIRATORY_TRACT | Status: DC
  Administered 2018-04-20 – 2018-04-22 (×5): 0.5 mg via RESPIRATORY_TRACT
  Filled 2018-04-20 (×5): qty 2

## 2018-04-20 MED ORDER — CHLORHEXIDINE GLUCONATE 0.12% ORAL RINSE (MEDLINE KIT)
15.0000 mL | Freq: Two times a day (BID) | OROMUCOSAL | Status: DC
  Administered 2018-04-20 – 2018-04-22 (×6): 15 mL via OROMUCOSAL

## 2018-04-20 MED ORDER — SODIUM CHLORIDE 0.9% FLUSH
3.0000 mL | INTRAVENOUS | Status: DC | PRN
  Administered 2018-04-20 – 2018-04-22 (×5): 3 mL via INTRAVENOUS
  Filled 2018-04-20 (×5): qty 3

## 2018-04-20 MED ORDER — CHLORHEXIDINE GLUCONATE CLOTH 2 % EX PADS
6.0000 | MEDICATED_PAD | Freq: Every day | CUTANEOUS | Status: DC
  Administered 2018-04-20 – 2018-04-21 (×2): 6 via TOPICAL

## 2018-04-20 MED ORDER — FENTANYL CITRATE (PF) 100 MCG/2ML IJ SOLN
50.0000 ug | INTRAMUSCULAR | Status: AC | PRN
  Administered 2018-04-20 – 2018-04-21 (×3): 50 ug via INTRAVENOUS
  Filled 2018-04-20 (×2): qty 2

## 2018-04-20 MED ORDER — VITAL HIGH PROTEIN PO LIQD
1000.0000 mL | ORAL | Status: AC
  Administered 2018-04-20: 1000 mL

## 2018-04-20 MED ORDER — PRO-STAT SUGAR FREE PO LIQD
60.0000 mL | Freq: Two times a day (BID) | ORAL | Status: DC
  Administered 2018-04-20 – 2018-04-22 (×5): 60 mL
  Filled 2018-04-20 (×4): qty 60

## 2018-04-20 MED ORDER — CHLORHEXIDINE GLUCONATE 0.12 % MT SOLN
OROMUCOSAL | Status: AC
  Administered 2018-04-20: 15 mL via OROMUCOSAL
  Filled 2018-04-20: qty 15

## 2018-04-20 MED ORDER — MIDAZOLAM HCL 2 MG/2ML IJ SOLN
2.0000 mg | INTRAMUSCULAR | Status: DC | PRN
  Administered 2018-04-20 – 2018-04-22 (×9): 2 mg via INTRAVENOUS
  Filled 2018-04-20 (×8): qty 2

## 2018-04-20 MED ORDER — HEPARIN SODIUM (PORCINE) 5000 UNIT/ML IJ SOLN
5000.0000 [IU] | Freq: Three times a day (TID) | INTRAMUSCULAR | Status: DC
  Administered 2018-04-20 – 2018-04-22 (×7): 5000 [IU] via SUBCUTANEOUS
  Filled 2018-04-20 (×7): qty 1

## 2018-04-20 MED ORDER — SODIUM CHLORIDE 0.9% FLUSH
3.0000 mL | Freq: Two times a day (BID) | INTRAVENOUS | Status: DC
  Administered 2018-04-21 – 2018-04-22 (×3): 3 mL via INTRAVENOUS

## 2018-04-20 MED ORDER — CEFAZOLIN SODIUM-DEXTROSE 2-3 GM-%(50ML) IV SOLR
INTRAVENOUS | Status: DC | PRN
  Administered 2018-04-20: 2 g via INTRAVENOUS

## 2018-04-20 MED ORDER — NICOTINE 21 MG/24HR TD PT24: 21.0000 mg | MEDICATED_PATCH | Freq: Every day | TRANSDERMAL | Status: DC

## 2018-04-20 MED ORDER — FENTANYL CITRATE (PF) 100 MCG/2ML IJ SOLN
50.0000 ug | INTRAMUSCULAR | Status: DC | PRN
  Administered 2018-04-20 – 2018-04-22 (×6): 50 ug via INTRAVENOUS
  Filled 2018-04-20 (×7): qty 2

## 2018-04-20 MED ORDER — EPHEDRINE SULFATE 50 MG/ML IJ SOLN
INTRAMUSCULAR | Status: DC | PRN
  Administered 2018-04-20 (×2): 5 mg via INTRAVENOUS

## 2018-04-20 MED ORDER — IPRATROPIUM-ALBUTEROL 0.5-2.5 (3) MG/3ML IN SOLN
3.0000 mL | Freq: Four times a day (QID) | RESPIRATORY_TRACT | Status: DC
  Administered 2018-04-20 – 2018-04-22 (×11): 3 mL via RESPIRATORY_TRACT
  Filled 2018-04-20 (×11): qty 3

## 2018-04-20 NOTE — Care Plan (Signed)
ENT plan of care note I have called and discussed his care with the patient's son, Santi Troung.  He may be reached at [phone].  The patient has a history of end-stage renal disease, atrial fibrillation, COPD, laryngeal cancer status post radiation, along with multiple other medical problems that we are working on getting records for.  He has had a significant amount of his medical care at [facility].  The patient's son will come and see him tomorrow morning.  I have informed him that he will be going to the to heart unit.  Given the amount of prior care with his dialysis and laryngeal cancer and vocal fold immobility that has gone at the [facility], we should discuss the potential for transfer for him moving forward.  The patient's son says he will let his uncle know and they will come and see the patient in the morning and decide.   Helayne Seminole, MD

## 2018-04-20 NOTE — H&P (Signed)
NAME:  Jimmy Martin, MRN:  [id number], DOB:  Jun 07, 1955, LOS: 0 ADMISSION DATE:  04/19/2018, CONSULTATION DATE:  11/23 REFERRING MD:  Dr. Laren Everts [facility], CHIEF COMPLAINT:  11/23  Brief History   62 year old male with history of laryngeal cancer s/p radiation, recently admitted in [place] and intubated for COPD + PNA. Treated, extubated, and transferred to [facility], where he developed stridor and was taken to [facility] for urgent tracheostomy.   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  He was recently admitted to [facility] after presenting with complaints of shortness of breath x3 to 4 days.  This was felt to likely be a combination of COPD exacerbation and pneumonia.  He was treated with broad-spectrum antibiotics and BiPAP for ventilatory support however, he failed BiPAP and required intubation only 3 hours after he was admitted.  COPD was treated with scheduled DuoNeb, Pulmicort, and Brovana nebulizers.  Solu-Medrol has been tapered down to 30 mg every 12 hours by the time of discharge.  For his pneumonia he received 10 days of IV vancomycin and 13 days of IV cefepime with plans to continue Cefepime for at least another 4 to 5 days after discharge. Urine strep antigen was positive, sputum culture was positive for haemophilus species and beta-hemolytic strep.  He was also treated for Klebsiella UTI .  He had hemodialysis catheter placed and was transitioned from peritoneal dialysis.  He improved with ongoing treatment and was able to be extubated on November 18, however, post extubation course was complicated by ongoing dysphasia and probable aspiration.  He had a PEG tube placed to facilitate nutrition.   [facility] for ongoing rehabilitation efforts.  However later that day he developed significant dyspnea and stridor.  He did not improve with nebulizers and racemic epinephrine.  Anesthesia at [facility] was contacted about potential intubation and he was ultimately taken to the operating room for tracheostomy placement.  PCCM was consulted regarding postoperative care.  Past Medical History  He has a past medical history of COPD (chronic obstructive pulmonary disease) ([place]), ESRD on peritoneal dialysis ([place]), HTN (hypertension), and Squamous cell carcinoma of larynx ([place]).   Significant Hospital Events   11/22 > taken to OR for urgent trach, admit to ICU  Consults:  ENT 11/23 >  Procedures:  Trach 11/23 > PEG 11/20 >  Significant Diagnostic Tests:    Micro Data:  Blood cx 11/23 > RVP 11/23 >  Antimicrobials:  Cefepime 11/9 Vanco 11/9 > 11/19  Interim history/subjective:    Objective   Blood pressure 116/71, pulse 96, temperature 97.9 F (36.6 C), temperature source Oral, resp. rate (!) 22, weight 77.4 kg, SpO2 99 %.    Vent Mode: PRVC FiO2 (%):  [100 %] 100 % Set Rate:  [14 bmp] 14 bmp Vt Set:  [520 mL] 520 mL PEEP:  [5 cmH20] 5 cmH20 Plateau Pressure:  [15 cmH20] 15 cmH20   Intake/Output Summary (Last 24 hours) at 04/20/2018 0146 Last data filed at 04/20/2018 0031 Gross per 24 hour  Intake 300 ml  Output -  Net 300 ml   Filed Weights   04/20/18 0100  Weight: 77.4 kg    Examination: General: chronically ill appearing  male in NAD HENT: New trach in place site CDI Lungs: Clear Cardiovascular: RRR, no MRG Abdomen: Soft, non-distended, PEG Extremities: No acute deformity Neuro: Somnolent but easily arouses GU: Foley  Resolved Hospital Problem list   Klebsiella UTI, Atrial fibrillation while critically ill, NSTEMI  Assessment & Plan:   Airway compromise: in the setting of exophytic mass and poor vocal fold motility.  - s/p OR for awake  tracheostomy 11/23 - Management per ENT  Acute on chronic hypoxemic respiratory failure - Remains somnolent so will keep on full support - Wean from vent to ATC as tolerated - VAP bundle - SpO2 goal 88-95%  COPD with acute exacerbation - steroids still weaning from prior admission (solumedrol 30mg  q 12 hours) can decrease or consider stopping this 11/24. - Continue budesonide, brovana, duonebs.   Pneumonia: Some concern this has been due to recurrent aspiration. Admitted to [facility] initially for this. Urine strep was positive. Sputum culture showed hemophilia species and beta-hemolytic strep.  S/p 10 days vancomycin and 13 days cefepime - Continue cefepime through 11/27 as planned at discharge - Check PCT - blood cultures, RVP.   ESRD on HD: was on PD until admission to [facility] where he was transitioned to HD - last tx 11/22 - Consult nephrology in the morning.  - Follow BMP   Persistent leukocytosis - followed by heme/onc at [facility] medical center. Has had this worked up including peritoneal fluid culture, peripheral smear, treated with empiric antifungals.  - given this and new findings concerning for laryngeal mass, maybe should transfer to [facility] once stable.  Squamous cell carcinoma of larynx, history, however new findings may indicate recurrence. - Followed at [facility].  Dysphagia - TF via PEG  Best practice:  Diet: TF Pain/Anxiety/Delirium protocol (if indicated): PRN fentanyl VAP protocol (if indicated): per protocol DVT prophylaxis: heparin GI prophylaxis: pepcid Glucose control:  Mobility: bed rest Code Status: FULL Family Communication: Son will be here in the morning.  Disposition: ICU  Labs   CBC: Recent Labs  Lab 04/19/18 1937  WBC 23.9*  NEUTROABS 21.3*  HGB 12.6*  HCT 40.0  MCV 92.4  PLT 672    Basic Metabolic Panel: Recent Labs  Lab 04/19/18 1937  NA 136  K 5.1  CL 95*  CO2 22  GLUCOSE 93  BUN 160*  CREATININE 8.58*  CALCIUM 8.7*    GFR: CrCl cannot be calculated (Unknown ideal weight.). Recent Labs  Lab 04/19/18 1937  WBC 23.9*    Liver Function Tests: Recent Labs  Lab 04/19/18 1937  AST 34  ALT 81*  ALKPHOS 74  BILITOT 1.2  PROT 6.1*  ALBUMIN 2.6*   No results for input(s): LIPASE, AMYLASE in the last 168 hours. No results for input(s): AMMONIA in the last 168 hours.  ABG    Component Value Date/Time   PHART 7.351 04/19/2018 1750   PCO2ART 40.6 04/19/2018 1750   PO2ART 78.3 (L) 04/19/2018 1750   HCO3 21.9 04/19/2018 1750   ACIDBASEDEF 2.8 (H) 04/19/2018 1750   O2SAT 93.5 04/19/2018 1750     Coagulation Profile: Recent Labs  Lab 04/19/18 1937  INR 1.08    Cardiac Enzymes: No results for input(s): CKTOTAL, CKMB, CKMBINDEX, TROPONINI in the last 168 hours.  HbA1C: No results found for: HGBA1C  CBG: Recent Labs  Lab 04/20/18 0124  GLUCAP 103*    Review of Systems:   Unable due to encephalopathy post op.   Past Medical History  He,  has a past medical history  of COPD (chronic obstructive pulmonary disease) ([place]), ESRD on peritoneal dialysis ([place]), HTN (hypertension), and Squamous cell carcinoma of larynx ([place]).   Surgical History      Social History   reports that he has been smoking cigarettes. He has been smoking about 1.00 pack per day. He does not have any smokeless tobacco history on file.   Family History   His family history is not on file.   Allergies Allergies not on file   Home Medications  Prior to Admission medications   Not on File     Critical care time: 50 min      Georgann Housekeeper, AGACNP-BC [place] Pager [phone] or [phone]  04/20/2018 2:10 AM

## 2018-04-20 NOTE — Consult Note (Signed)
Renal Service Consult Note [facility]  Jimmy Martin 04/20/2018 [place] Requesting Physician:  Dr Jimmy Martin  Reason for Consult:  ESRD pt sp trach emergently HPI: The patient is a 62 y.o. year-old with hx of HTN, COPD, ESRD and laryngeal Ca admitted to [facility] yesterday after admission in [place] form 11/9 - 11/22 for HCAP/ PNA w resp failure.  On arrival to [facility]est had stridor and was taken emergently to OR for trach placement.  Now is admitted on [place], asked to see for ESRD.    Unable to get hx from patient, talked w/ brother, on dialysis about 4-5 years, was doing home PD.  Pt lives in [place] w/ his stepson.  Per brother has been getting chemoRx for his laryngeal Ca for about 2-3 yrs, his voice has improved.  Has COPD, not on home O2.    Notes from RN >   11/9 -  Admit Day for Hcap/ COPD and resp failure, presented SOB  11/18 - extubated  11/21 - PEG tube and new HD cath placed  11/22 - dc'd to [place]  11/22 - last HD 11/22  Also per notes pt was "transitioned from PD to hemodialysis" while admitted there.    ROS  n/a   Past Medical History  Past Medical History:  Diagnosis Date  . COPD (chronic obstructive pulmonary disease) (HCC)    Severe  . ESRD on peritoneal dialysis ([place])   . HTN (hypertension)   . Squamous cell carcinoma of larynx [facility])    s/p radiation therapy   Past Surgical History  See above Family History No family history on file. Social History  reports that he has been smoking cigarettes. He has been smoking about 1.00 pack per day. He does not have any smokeless tobacco history on file. His alcohol and drug histories are not on file. Allergies Allergies not on file Home medications Prior to Admission medications   Not on File   Liver Function Tests Recent Labs  Lab 04/19/18 1937  AST 34  ALT 81*  ALKPHOS 74  BILITOT 1.2  PROT 6.1*  ALBUMIN 2.6*   No results for input(s): LIPASE,  AMYLASE in the last 168 hours. CBC Recent Labs  Lab 04/19/18 1937 04/20/18 0306 04/20/18 0527  WBC 23.9* 22.4* 18.8*  NEUTROABS 21.3*  --   --   HGB 12.6* 12.1* 11.5*  HCT 40.0 38.8* 37.0*  MCV 92.4 94.2 94.9  PLT 200 176 626   Basic Metabolic Panel Recent Labs  Lab 04/19/18 1937 04/20/18 0306 04/20/18 0527  NA 136  --  135  K 5.1  --  6.2*  CL 95*  --  94*  CO2 22  --  20*  GLUCOSE 93  --  117*  BUN 160*  --  183*  CREATININE 8.58* 9.16* 9.35*  CALCIUM 8.7*  --  8.5*  PHOS  --   --  18.0*   Iron/TIBC/Ferritin/ %Sat No results found for: IRON, TIBC, FERRITIN, IRONPCTSAT  Vitals:   04/20/18 0600 04/20/18 0700 04/20/18 0739 04/20/18 0800  BP: 108/70 123/74  125/71  Pulse: 84 88  (!) 103  Resp: (!) 21 20  (!) 24  Temp:   98 F (36.7 C) 97.9 F (36.6 C)  TempSrc:   Oral Oral  SpO2: 99% 98%  97%  Weight:       Exam Gen on vent, +trach, a bit agitated, follows commands, alert No rash, cyanosis or gangrene Sclera anicteric,  throat w/ trach+ETT  No jvd or bruits Chest clear bilat no rales or wheezing RRR no MRG Abd soft ntnd no mass or ascites +bs GU normal male MS no joint effusions or deformity Ext no LE or UE edema, no wounds or ulcers Neuro is alert, moves all ext and follows commands R IJ TDC in place, bloody exit site    ABG 7.26/ 49/ 357  Na 135  K 6.2  CO2 20  BUN 183  Cr 9.35  Phos 18  CXR - no active disease on 11/23   Impression/ Plan: 1. ESRD - pt on PD ~ 5-6 years, has been "transitioned" now to HD and had new R IJ TDC placed at [facility] on 11/21.  Azotemic , confused poss due to uremia. BUN 183. Needs serial HD.  2. Laryngeal Ca - for 2-3 years, has rec'd chemo/ XRT acc to family 3. Resp failure - sp trach 4. SP PEG tube - done on 11/21 5. COPD - not on home O2 6. HTN - not sure home meds 7. MBD - phos very high, start binders, go HD serial 8. Anemia ckd - Hb 11-12, observe for now     Jimmy Splinter MD [place] pager [phone]   04/20/2018, 10:27 AM

## 2018-04-20 NOTE — Progress Notes (Signed)
ENT Consult Progress Note  Subjective: Remains on ventilator on minimal settings. Pain appears to be controlled Trach is hemostatic   Objective: Vitals:   04/20/18 0800 04/20/18 1128  BP: 125/71   Pulse: (!) 103   Resp: (!) 24   Temp: 97.9 F (36.6 C) 98.2 F (36.8 C)  SpO2: 97%     Physical Exam: CONSTITUTIONAL: Sedated.  Appears comfortable CARDIOVASCULAR: normal rate and regular rhythm PULMONARY/CHEST WALL: Symmetric movement Vent Mode: CPAP;PSV FiO2 (%):  [40 %-100 %] 40 % Set Rate:  [14 bmp-20 bmp] 20 bmp Vt Set:  [520 mL-550 mL] 550 mL PEEP:  [5 cmH20] 5 cmH20 Pressure Support:  [10 cmH20] 10 cmH20 Plateau Pressure:  [13 cmH20-15 cmH20] 13 cmH20  HENT: Head : normocephalic and atraumatic Ears: Right ear:   canal normal, external ear normal and hearing normal Left ear:   canal normal, external ear normal and hearing normal Nose: nose normal and no purulence Mouth/Throat:  Mouth: uvula midline and no oral lesions Throat: oropharynx clear and moist EYES: conjunctiva normal, EOM normal and PERRL NECK: supple, trachea normal 6 DCT Shiley is in place with superior and inferior tracheal stay sutures.  The superior tracheal stay suture has 2 knots.  Inferior has 1 knot.  Cuff is slated.  The tracheostomy site appears dry.  Dale trach collar in place Patient has a G-tube which was placed on 04/17/2018.  Data Review/Consults/Discussions: Discussed case with critical care. I have been in contact with the otolaryngology team from [place] and [place] in order to try to get more history about his recent ENT visits. I have reviewed copious records from the LTAC from which he came.  It appears that he has been followed by Dr. Brien Mates with [facility] E[facility].  There was some discussion of "laryngotracheal separation" treatment in the future.  I have called and discussed this with the on-call ENT this weekend covering for the practice.  Awaiting a call back regarding further  record review on their end for work-up of recent worsening vocal cord paralysis and stridor.  Assessment: Jimmy Martin 62 y.o. male who presents with acute airway compromise in the setting of prior laryngeal cancer treated with chemo RT in 2017.  Awake tracheotomy was urgently performed overnight.  There was significant necrotic debris in the supraglottic airway.  This in combination with swelling from recent intubation and prior laryngeal radiation therapy resulted in a minimal glottic opening.  It is unclear to me whether this debris in the supraglottis represents a possible recurrence of his malignancy.  I am in contact with the ENT team in [facility] to see if they have done any direct laryngoscopy's were biopsies recently.  Patient is on the critical care service for multiple other comorbidities including hypertension, COPD, ESRD, dialysis with recent HCAP pneumonia.    Plan:  -Trach care per protocol -Will discuss patient's recent recent ENT care with the [facility] ear nose and throat physicians.   Thank you for involving [facility] in the care of this patient. Should you need further assistance, please call our office at [phone].    Gavin Pound, MD

## 2018-04-20 NOTE — Progress Notes (Signed)
After reviewing his records from [facility], it appears that he has had difficulty urinating since Jan of 2013. He has had a foley or has performed in and out craterizations since. This was confirmed by his brother and step son.  Family also reported prior to his admission to the hospital on 11/09, the patient was A/O X 4, independent, and in the process of building a new garage. They were told that he was confused d/t uremia. The brother also stated he went from 11/09 until 11/22 without getting any dialysis.   According to his records he had his PD catheter placed 09/07/11.

## 2018-04-20 NOTE — Progress Notes (Signed)
Pharmacy Antibiotic Note  Jimmy Martin is a 62 y.o. male transferred from [facility] with stridor s/p tracheostomy.  Pharmacy has been consulted for Cefepime dosing to complete course for PNA  Plan: Cefepime 1 g IV q24h  Weight: 170 lb 10.2 oz (77.4 kg)  Temp (24hrs), Avg:97.9 F (36.6 C), Min:97.9 F (36.6 C), Max:97.9 F (36.6 C)  Recent Labs  Lab 04/19/18 1937  WBC 23.9*  CREATININE 8.58*    CrCl cannot be calculated (Unknown ideal weight.).    Allergies not on file   Caryl Pina 04/20/2018 2:40 AM

## 2018-04-20 NOTE — Progress Notes (Signed)
Initial Nutrition Assessment  DOCUMENTATION CODES:   Not applicable  INTERVENTION:   -D/c Vital High Protein  -Initiate TF via PEG with Nepro at goal rate of 30 ml/h (720 ml per day) and Prostat 60 ml BID to provide 1696 kcals, 118 gm protein, 523 ml free water daily.  NUTRITION DIAGNOSIS:   Inadequate oral intake related to inability to eat as evidenced by NPO status.  GOAL:   Patient will meet greater than or equal to 90% of their needs  MONITOR:   Vent status, Labs, Weight trends, Skin, I & O's  REASON FOR ASSESSMENT:   Consult, Ventilator Enteral/tube feeding initiation and management  ASSESSMENT:   62 yr old male w/ PMHx significant for SCC laryngeal CA s/p radiation, ESRD (was on PD at home but once admitted to [facility] received RIJ HD cath and was getting HD), recently treated for PNA(Urine strep antigen was positive, sputum culture was positive for haemophilus species and beta-hemolytic strep) and Klebsiella UTI, persistent leukocytosis, was on a steroid taper( last dose Solumedrol 30mg  Q12) was transferred to [facility] when he was improving and subsequently developed significant stridor not responding to 6 rounds of racemic epi. Was evaluated by ENT and on visualization via nasal fiberoptic inspection exophytic debris along the posterior arytenoids with minimal vocal cord movement. Decision made to do an awake tracheostomy in [place]. PCCM consulted to manage post surgical intervention.  Pt admitted with acute airway compromise.   11/9 -  Admit [facility] for Hcap/ COPD and resp failure, presented SOB 11/18 - extubated 11/21 - PEG tube and new HD cath placed 11/22 - dc'd to [facility] 11/22 - last HD 11/22 11/23- s/p awake tracheostomy  Patient is currently intubated on ventilator support MV: 8.8 L/min Temp (24hrs), Avg:98 F (36.7 C), Min:97.9 F (36.6 C), Max:98.2 F (36.8 C)  Reviewed I/O's: 240 ml urine output x 24 hours, +110 ml x 24 hours  Case  discussed with RN, who reports TF is currently tolerating TF well. Pt is scheduled for HD today (was on PD prior to admission to [facility]). Reviewed records, which are limited, noted last wt 91.6 kg on 04/04/16 (15.5% wt loss over the past 2 years). Wt is difficult to assess given history of HD.   Vital High Protein infusing at 40 ml/hr via PEG. Pt also recieivng 30 ml Prostat BID. Complete regimen providing 1160 kcals and 114 grams protein, meeting 69% of estimated kcal needs and 98% of estimated protein needs.   Per RN, family is expected to come in today to discuss goals of care.   Labs reviewed: K: 6.2, Phos: 18, Mg: 3.1.   NUTRITION - FOCUSED PHYSICAL EXAM:    Most Recent Value  Orbital Region  Mild depletion  Upper Arm Region  No depletion  Thoracic and Lumbar Region  No depletion  Buccal Region  No depletion  Temple Region  No depletion  Clavicle Bone Region  No depletion  Clavicle and Acromion Bone Region  No depletion  Scapular Bone Region  No depletion  Dorsal Hand  Unable to assess  Patellar Region  Mild depletion  Anterior Thigh Region  Mild depletion  Posterior Calf Region  Mild depletion  Edema (RD Assessment)  Mild  Hair  Reviewed  Eyes  Reviewed  Mouth  Reviewed  Skin  Reviewed  Nails  Reviewed       Diet Order:   Diet Order            Diet NPO  time specified  Diet effective now              EDUCATION NEEDS:   Not appropriate for education at this time  Skin:  Skin Assessment: Skin Integrity Issues: Skin Integrity Issues:: Incisions, Stage II, DTI DTI: lt buttock Stage II: coccyx, rt buttocks Incisions: closed neck  Last BM:  PTA  Height:   Ht Readings from Last 1 Encounters:  04/20/18 5' 7.99" (1.727 m)    Weight:   Wt Readings from Last 1 Encounters:  04/20/18 77.4 kg    Ideal Body Weight:  70 kg  BMI:  Body mass index is 25.95 kg/m.  Estimated Nutritional Needs:   Kcal:  1690  Protein:  115-130 grams  Fluid:   1000 ml + UOP    Spruha Weight A. Jimmye Norman, RD, LDN, CDE Pager: [phone] After hours Pager: [phone]

## 2018-04-20 NOTE — Progress Notes (Signed)
Sputum culture collected and sent to the lab. 

## 2018-04-20 NOTE — Op Note (Signed)
DATE OF PROCEDURE:  04/20/2018    PRE-OPERATIVE DIAGNOSIS: Airway compromise Laryngeal tumor    POST-OPERATIVE DIAGNOSIS: Same    PROCEDURE(S): Awake tracheostomy, emergent   SURGEON: Gavin Pound, MD    ASSISTANT(S): Serita Grammes, MD    ANESTHESIA: Local    ESTIMATED BLOOD LOSS: 10 mL   SPECIMENS: none    COMPLICATIONS: None    OPERATIVE FINDINGS: The patient was brought to the emergency department from his [place] facility for acute shortness of breath after having received 6 rounds of racemic epinephrine.  He was stridulous and acutely decompensating.  The decision was made to to take the patient emergently to the operating room for awake tracheostomy in order to secure his airway.  The patient had a somewhat low-lying cricothyroid. Two tracheal stay sutures were placed- 2 knots were placed in the superior tracheal rings while one knot was placed on the suture around the inferior tracheal ring.  A 6 DCT Shiley tracheostomy tube was placed    OPERATIVE DETAILS: Due to severe shortness of breath and stridor, the patient was brought emergently to the operating room.  He was placed in a seated position on the operating table.  The skin was then marked and anesthetized with 1% lidocaine with 1-100,000 epinephrine.  Betadine was used to clean the skin.  A timeout was performed.  An incision was made with the electrocautery.  The midline raphae was identified and incised with cautery.  The tracheal rings were then identified.  A cric hook was placed in the cricoid cartilage and pulled superiorly.  The thyroid inferiorly was divided with electrocautery.  Meticulous hemostasis was achieved.  Next the space between first and second tracheal rings was then incised with a 15 blade scalpel.  A 2-0 silk suture was then placed through the inferior and superior tracheal rings.  The 6 DCT Shiley was then introduced.  All other interpretation was removed.  The cuff was inflated.   End-tidal CO2 was then confirmed.  The ileostomy tube was then secured to the skin with a 2-0 silk suture and ideal trach collar.  The patient skin was cleansed.  All instrumentation was then removed.  The patient tolerated the procedure well.  He was transferred to the ICU postoperatively.

## 2018-04-20 NOTE — Progress Notes (Signed)
Patient awake and extremely agitated. Disconnected vent from trach with his mitts on. He is moving his lips saying he wants to go home. He doesn't care if he dies because he is 62 years old and been dealing with being sick for 7 years. He stated he has a $50,000 car at home that he wants to go home and drive. RN encouraged patient to stay and get dialysis and let them try to get him off the vent. He continued to be very angry and trying to get OOB, restless in the bed with legs hanging over the bed rails. Dr.Gonzales notified and ordered Versed 2mg  Q 2 hours as needed and a Nicotine patch. Also called his brother to let him know and he said he just wants to wait and see how he does after getting dialysis for a couple days in a row.

## 2018-04-20 NOTE — Consult Note (Signed)
[place]   Primary Care Physician: Default, Provider, MD Patient Location at Initial Consult: Emergency Department Chief Complaint/Reason for Consult: airway compromise  History of Presenting Illness:    Jimmy Martin is a  62 y.o. male presenting with   acute airway compromise.  [facility] began to have worsening shortness of breath.  He was given 6 treatments of racemic epinephrine with out any improvement in his shortness of breath.  He arrives stridulous and on 100% nonrebreather.  Oxygen levels were 100% but he was obviously struggling to breathe.  He had a flat affect and was becoming mildly difficult to arouse.  I used an Ambu Slim flexible endoscopy in his right naris to examine the supraglottic airway.  There was exophytic debris along the posterior arytenoids with minimal vocal cord movement.  There was no discernible opening.  It is likely that this exophytic debris along the posterior portion of the supraglottic airway may have had a significant vocal cord opening below that.  However there was minimal arytenoid movement bilaterally.    No past medical history on file.   No family history on file.  Social History   Socioeconomic History  . Marital status: Married    Spouse name: Not on file  . Number of children: Not on file  . Years of education: Not on file  . Highest education level: Not on file  Occupational History  . Not on file  Social Needs  . Financial resource strain: Not on file  . Food insecurity:    Worry: Not on file    Inability: Not on file  . Transportation needs:    Medical: Not on file    Non-medical: Not on file  Tobacco Use  . Smoking status: Not on file  Substance and Sexual Activity  . Alcohol use: Not on file  . Drug use: Not on file  . Sexual activity: Not on file  Lifestyle  . Physical activity:    Days per week: Not on file    Minutes per session:  Not on file  . Stress: Not on file  Relationships  . Social connections:    Talks on phone: Not on file    Gets together: Not on file    Attends religious service: Not on file    Active member of club or organization: Not on file    Attends meetings of clubs or organizations: Not on file    Relationship status: Not on file  Other Topics Concern  . Not on file  Social History Narrative  . Not on file    No current facility-administered medications on file prior to encounter.    No current outpatient medications on file prior to encounter.    Allergies not on file   Review of Systems: ROS unable to be completed due to patient in airway distress   OBJECTIVE: Vital Signs: There were no vitals filed for this visit.  I&O  Intake/Output Summary (Last 24 hours) at 04/20/2018 0105 Last data filed at 04/20/2018 0031 Gross per 24 hour  Intake 300 ml  Output -  Net 300 ml    Physical Exam General: Well developed, well nourished. G tube  Head/Face: Normocephalic, atraumatic. No scars or lesions. No sinus tenderness. Facial nerve intact and equal bilaterally.  Eyes: Globes well positioned, no proptosis Lids: No periorbital edema/ecchymosis. No lid laceration Conjunctiva: No chemosis, hemorrhage PERRL  Ears: No gross deformity.  Normal external canal.   Nose: No gross deformity or lesions. No purulent discharge. Septum midline. No turbinate hypertrophy.  Mouth/Oropharynx: Lips without any lesions.  No mucosal lesions within the oropharynx. No tonsillar enlargement, exudate, or lesions. Pharyngeal walls symmetrical. Uvula midline. Tongue midline without lesions.  Patient was coughing up exophytic debris  Larynx: See TFL  Nasopharynx: See TFL  Neck: Trachea midline. No masses. No thyromegaly or nodules palpated. No crepitus.  Lymphatic: No lymphadenopathy in the neck. Central line right neck  Respiratory:  In an acute distress with poor airflow  Cardiovascular: Regular rate and  rhythm.  Extremities: No edema or cyanosis. Warm and well-perfused.  Skin: No scars or lesions on face or neck.  Neurologic: CN II-XII intact. Moving all extremities without gross abnormality.  Other:      Labs: Lab Results  Component Value Date   WBC 23.9 (H) 04/19/2018   HGB 12.6 (L) 04/19/2018   HCT 40.0 04/19/2018   PLT 200 04/19/2018   ALT 81 (H) 04/19/2018   AST 34 04/19/2018   NA 136 04/19/2018   K 5.1 04/19/2018   CL 95 (L) 04/19/2018   CREATININE 8.58 (H) 04/19/2018   BUN 160 (H) 04/19/2018   CO2 22 04/19/2018   INR 1.08 04/19/2018     Review of Ancillary Data / Diagnostic Tests: None available at time of consult  ASSESSMENT:  62 y.o. male with acute airway compromise in the setting of poor vocal fold mobility and an exophytic mass in the posterior supraglottic airway.   RECOMMENDATIONS:  It was felt unanimously between the emergency department staff, trauma surgery, and myself that this patient be best served with an awake tracheostomy in the operating room on an emergent basis.  We proceeded directly from the trauma bay to the operating room to secure his airway with an awake tracheostomy.   Gavin Pound, MD  [facility] [place] Office phone [phone]

## 2018-04-21 ENCOUNTER — Encounter (HOSPITAL_COMMUNITY)
Admission: EM | Disposition: A | Discharge status: Nursing Home (Includes ICF) | Payer: Self-pay | Source: Other Acute Inpatient Hospital | Attending: Critical Care Medicine

## 2018-04-21 DIAGNOSIS — J9601 Acute respiratory failure with hypoxia: Secondary | ICD-10-CM

## 2018-04-21 DIAGNOSIS — J9602 Acute respiratory failure with hypercapnia: Secondary | ICD-10-CM

## 2018-04-21 DIAGNOSIS — Z992 Dependence on renal dialysis: Secondary | ICD-10-CM

## 2018-04-21 DIAGNOSIS — N186 End stage renal disease: Secondary | ICD-10-CM

## 2018-04-21 DIAGNOSIS — G9340 Encephalopathy, unspecified: Secondary | ICD-10-CM

## 2018-04-21 DIAGNOSIS — J42 Unspecified chronic bronchitis: Secondary | ICD-10-CM

## 2018-04-21 LAB — POCT I-STAT 3, ART BLOOD GAS (G3+)
Bicarbonate: 24.7 mmol/L (ref 20.0–28.0)
O2 SAT: 99 %
PCO2 ART: 37.4 mmHg (ref 32.0–48.0)
TCO2: 26 mmol/L (ref 22–32)
pH, Arterial: 7.428 (ref 7.350–7.450)
pO2, Arterial: 155 mmHg — ABNORMAL HIGH (ref 83.0–108.0)

## 2018-04-21 LAB — GLUCOSE, CAPILLARY
GLUCOSE-CAPILLARY: 110 mg/dL — AB (ref 70–99)
GLUCOSE-CAPILLARY: 98 mg/dL (ref 70–99)
GLUCOSE-CAPILLARY: 129 mg/dL — AB (ref 70–99)
Glucose-Capillary: 110 mg/dL — ABNORMAL HIGH (ref 70–99)
Glucose-Capillary: 92 mg/dL (ref 70–99)

## 2018-04-21 LAB — PHOSPHORUS
PHOSPHORUS: 19.1 mg/dL — AB (ref 2.5–4.6)
Phosphorus: 9.1 mg/dL — ABNORMAL HIGH (ref 2.5–4.6)

## 2018-04-21 LAB — RENAL FUNCTION PANEL
ANION GAP: 23 — AB (ref 5–15)
Albumin: 2.2 g/dL — ABNORMAL LOW (ref 3.5–5.0)
BUN: 232 mg/dL — ABNORMAL HIGH (ref 6–20)
CALCIUM: 7.9 mg/dL — AB (ref 8.9–10.3)
CO2: 17 mmol/L — ABNORMAL LOW (ref 22–32)
Chloride: 98 mmol/L (ref 98–111)
Creatinine, Ser: 10.17 mg/dL — ABNORMAL HIGH (ref 0.61–1.24)
GFR calc non Af Amer: 5 mL/min — ABNORMAL LOW (ref >60)
GFR, EST AFRICAN AMERICAN: 6 mL/min — AB (ref >60)
GLUCOSE: 117 mg/dL — AB (ref 70–99)
Phosphorus: 19.2 mg/dL — ABNORMAL HIGH (ref 2.5–4.6)
Potassium: 6.5 mmol/L (ref 3.5–5.1)
Sodium: 138 mmol/L (ref 135–145)

## 2018-04-21 LAB — HEPATITIS B SURFACE ANTIGEN: Hepatitis B Surface Ag: NEGATIVE

## 2018-04-21 LAB — BASIC METABOLIC PANEL
Anion gap: 14 (ref 5–15)
BUN: 63 mg/dL — ABNORMAL HIGH (ref 6–20)
CALCIUM: 8 mg/dL — AB (ref 8.9–10.3)
CO2: 24 mmol/L (ref 22–32)
Chloride: 96 mmol/L — ABNORMAL LOW (ref 98–111)
Creatinine, Ser: 3.74 mg/dL — ABNORMAL HIGH (ref 0.61–1.24)
GFR calc non Af Amer: 16 mL/min — ABNORMAL LOW (ref >60)
GFR, EST AFRICAN AMERICAN: 19 mL/min — AB (ref >60)
GLUCOSE: 102 mg/dL — AB (ref 70–99)
POTASSIUM: 3.4 mmol/L — AB (ref 3.5–5.1)
Sodium: 134 mmol/L — ABNORMAL LOW (ref 135–145)

## 2018-04-21 LAB — MAGNESIUM
Magnesium: 3.6 mg/dL — ABNORMAL HIGH (ref 1.7–2.4)
Magnesium: 2.3 mg/dL (ref 1.7–2.4)
Magnesium: 2.5 mg/dL — ABNORMAL HIGH (ref 1.7–2.4)

## 2018-04-21 SURGERY — LARYNGOSCOPY, DIRECT
Anesthesia: General

## 2018-04-21 MED ORDER — CHLORHEXIDINE GLUCONATE CLOTH 2 % EX PADS
6.0000 | MEDICATED_PAD | Freq: Every day | CUTANEOUS | Status: DC
  Administered 2018-04-22: 6 via TOPICAL

## 2018-04-21 MED ORDER — FENTANYL 2500MCG IN NS 250ML (10MCG/ML) PREMIX INFUSION: 0.0000 ug/h | INTRAVENOUS | Status: DC

## 2018-04-21 MED ORDER — HEPARIN SODIUM (PORCINE) 1000 UNIT/ML DIALYSIS
3000.0000 [IU] | INTRAMUSCULAR | Status: DC | PRN
  Administered 2018-04-22: 3000 [IU] via INTRAVENOUS_CENTRAL
  Filled 2018-04-21: qty 3

## 2018-04-21 MED ORDER — FENTANYL 2500MCG IN NS 250ML (10MCG/ML) PREMIX INFUSION
50.0000 ug/h | INTRAVENOUS | Status: DC
  Administered 2018-04-21: 50 ug/h via INTRAVENOUS
  Filled 2018-04-21: qty 250

## 2018-04-21 MED ORDER — SODIUM CHLORIDE 0.9 % IV BOLUS
250.0000 mL | Freq: Once | INTRAVENOUS | Status: AC
  Administered 2018-04-21: 250 mL via INTRAVENOUS

## 2018-04-21 MED ORDER — DEXMEDETOMIDINE HCL IN NACL 400 MCG/100ML IV SOLN
0.4000 ug/kg/h | INTRAVENOUS | Status: DC
  Administered 2018-04-21: 1.1 ug/kg/h via INTRAVENOUS
  Administered 2018-04-21: 0.9 ug/kg/h via INTRAVENOUS
  Administered 2018-04-21: 1.2 ug/kg/h via INTRAVENOUS
  Administered 2018-04-21: 1.1 ug/kg/h via INTRAVENOUS
  Administered 2018-04-22: 1.2 ug/kg/h via INTRAVENOUS
  Administered 2018-04-22: 1 ug/kg/h via INTRAVENOUS
  Filled 2018-04-21 (×7): qty 100

## 2018-04-21 MED ORDER — SODIUM CHLORIDE 0.9 % IV BOLUS
500.0000 mL | Freq: Once | INTRAVENOUS | Status: AC
  Administered 2018-04-21: 500 mL via INTRAVENOUS

## 2018-04-21 NOTE — Progress Notes (Signed)
Respiratory viral panel was positive for Rhinovirus/Enterovirus only so droplet precautions discontinued.

## 2018-04-21 NOTE — Progress Notes (Signed)
NAME:  Jimmy Martin, MRN:  [id number], DOB:  1955-08-21, LOS: 1 ADMISSION DATE:  04/19/2018, CONSULTATION DATE:  11/23 REFERRING MD:  Dr. Laren Everts [facility], CHIEF COMPLAINT:  11/23  Brief History   62 year old male with history of laryngeal cancer s/p radiation, recently admitted in [place] and intubated for COPD + PNA. Treated, extubated, and transferred to [facility], where he developed stridor and was taken to [facility] for urgent tracheostomy.   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  He was recently admitted to [facility] after presenting with complaints of shortness of breath x3 to 4 days.  This was felt to likely be a combination of COPD exacerbation and pneumonia.  He was treated with broad-spectrum antibiotics and BiPAP for ventilatory support however, he failed BiPAP and required intubation only 3 hours after he was admitted.  COPD was treated with scheduled DuoNeb, Pulmicort, and Brovana nebulizers.  Solu-Medrol has been tapered down to 30 mg every 12 hours by the time of discharge.  For his pneumonia he received 10 days of IV vancomycin and 13 days of IV cefepime with plans to continue Cefepime for at least another 4 to 5 days after discharge. Urine strep antigen was positive, sputum culture was positive for haemophilus species and beta-hemolytic strep.  He was also treated for Klebsiella UTI .  He had hemodialysis catheter placed and was transitioned from peritoneal dialysis.  He improved with ongoing treatment and was able to be extubated on November 18, however, post extubation course was complicated by ongoing dysphasia and probable aspiration.  He had a PEG tube placed to facilitate nutrition.   [facility]  efforts.  However later that day he developed significant dyspnea and stridor.  He did not improve with nebulizers and racemic epinephrine.  Anesthesia at [facility] was contacted about potential intubation and he was ultimately taken to the operating room for tracheostomy placement.  PCCM was consulted regarding postoperative care.  Past Medical History  He has a past medical history of COPD (chronic obstructive pulmonary disease) ([place]), ESRD on peritoneal dialysis ([place]), HTN (hypertension), and Squamous cell carcinoma of larynx ([place]).   Significant Hospital Events   11/22 > taken to OR for urgent trach, admit to ICU  Consults:  ENT 11/23 > Nephrology 11/23 Procedures:  Trach 11/23 > PEG 11/20 >  Significant Diagnostic Tests:    Micro Data:  Blood cx 11/23 > RVP 11/23 >  Antimicrobials:  Cefepime 11/9 Vanco 11/9 > 11/19  Interim history/subjective:    Objective   Blood pressure 123/68, pulse (!) 112, temperature 98.9 F (37.2 C), temperature source Oral, resp. rate (!) 26, height 5' 7.99" (1.727 m), weight 79.1 kg, SpO2 99 %.    Vent Mode: PRVC FiO2 (%):  [40 %] 40 % Set Rate:  [20 bmp] 20 bmp Vt Set:  [550 mL] 550 mL PEEP:  [5 cmH20] 5 cmH20 Plateau Pressure:  [18 cmH20] 18 cmH20   Intake/Output Summary (Last 24 hours) at 04/21/2018 2026 Last data filed at 04/21/2018 1900 Gross per 24 hour  Intake 2439.94 ml  Output -785 ml  Net 3224.94 ml   Filed Weights   04/21/18 0400 04/21/18 0700 04/21/18 1125  Weight: 77.3 kg 77.3 kg 79.1 kg  Examination: General: chronically ill appearing male in NAD HENT: New trach in place, scant bloody secretions around stoma site.  Lungs: Clear Cardiovascular: RRR, no MRG Abdomen: Soft, non-distended, PEG Extremities: No acute deformity Neuro: appears agitated, moving arms GU: Foley  Resolved Hospital Problem list   Klebsiella UTI, Atrial fibrillation while critically ill, NSTEMI  Assessment & Plan:   Airway  compromise: in the setting of exophytic mass and poor vocal fold motility. Trach in place ENT to biopsy tomorrow  Acute on chronic hypoxemic respiratory failure - Will wean with less agitated, after HD.   COPD with acute exacerbation - steroids still weaning from prior admission (solumedrol 52m q 12 hours).  Resp Acidosis (combined with metabolic), will cont steroids for now.  CO2 around 50 may be near his baseline however.   - Continue budesonide, brovana, duonebs.  Still on fairly aggressive vent settigns: PRVC 550/rr 20/Peep 5.   Recheck ABG, wean if looks better.    Recent Pneumonia: Some concern this has been due to recurrent aspiration. [facility] Urine strep was positive. Sputum culture showed hemophilia species and beta-hemolytic strep.  S/p 10 days vancomycin and 13 days cefepime - Continue cefepime through 11/27 as planned at discharge - Bl cx pending.  CXR actually looks pretty clear from 11/23.    ES[facility] on HD: [facility] Needs HD today, very uremic. acidotic (anion gap) - last tx 11/22 Will be done today.  Follow up post HD labs.    Squamous cell carcinoma of larynx, history, however new findings may indicate recurrence. - Followed at [facility]. ENT was going to biopsy in OR today, but unfortunately did not get dialysis as we had hoped overnight, now will tentantively plan for biopsy this coming week.   Goals of care: patient is expressing that he wants to die.  Doesn't want to live like this.  Unclear he is able to grasp the situation given his severe uremia and acidosis. Family doesn't have clear understanding of current situation (per primary nurse.  I have not met them yet), but discussions are warranted after a few sessions of HD and more mental clarity.    Dysphagia - TF via PEG  Best practice:  Diet: TF Pain/Anxiety/Delirium protocol (if indicated): PRN fentanyl.  On  precedex for agitation.   VAP protocol (if indicated): per protocol DVT prophylaxis: heparin GI prophylaxis: pepcid Glucose control:  Mobility: bed rest Code Status: FULL  Critical care time 60 minutes.

## 2018-04-21 NOTE — Transfer of Care (Signed)
Immediate Anesthesia Transfer of Care Note  Patient: Jimmy Martin  Procedure(s) Performed: TRACHEOSTOMY (N/A )  Patient Location: ICU  Anesthesia Type:MAC  Level of Consciousness: drowsy and patient cooperative  Airway & Oxygen Therapy: Patient connected to tracheostomy mask oxygen  Post-op Assessment: Report given to RN and Post -op Vital signs reviewed and stable  Post vital signs: Reviewed and stable  Last Vitals:  Vitals Value Taken Time  BP 105/93 04/21/2018 11:15 AM  Temp    Pulse 116 04/21/2018 11:23 AM  Resp 28 04/21/2018 11:23 AM  SpO2 99 % 04/21/2018 11:23 AM  Vitals shown include unvalidated device data.  Last Pain:  Vitals:   04/21/18 0800  TempSrc:   PainSc: Asleep         Complications: No apparent anesthesia complications

## 2018-04-21 NOTE — Progress Notes (Signed)
ENT Consult Progress Note  Subjective: Remains on ventilator on minimal settings. +acute delirium. In restraints.   Patient was scheduled to undergo Direct laryngoscopy with biopsy this morning. However, Renal team requested cancellation 2/2 urgent dialysis needs. Creatinine rose to 10 this morning. Currently on HD.    Objective: Vitals:   04/21/18 1015 04/21/18 1030  BP: 124/81 115/75  Pulse: (!) 107 (!) 110  Resp: (!) 22 (!) 24  Temp:    SpO2: 99% 99%    Physical Exam: CONSTITUTIONAL: Sedated.  Appears comfortable CARDIOVASCULAR: normal rate and regular rhythm PULMONARY/CHEST WALL: Symmetric movement Vent Mode: PRVC FiO2 (%):  [40 %] 40 % Set Rate:  [20 bmp] 20 bmp Vt Set:  [550 mL] 550 mL PEEP:  [5 cmH20] 5 cmH20 Pressure Support:  [10 cmH20] 10 cmH20 Plateau Pressure:  [18 cmH20] 18 cmH20  HENT: Head : normocephalic and atraumatic Ears: Right ear:   canal normal, external ear normal and hearing normal Left ear:   canal normal, external ear normal and hearing normal Nose: nose normal and no purulence Mouth/Throat:  Mouth: uvula midline and no oral lesions Throat: oropharynx clear and moist EYES: conjunctiva normal, EOM normal and PERRL NECK: supple, trachea normal 6 DCT Shiley is in place with superior and inferior tracheal stay sutures.  The superior tracheal stay suture has 2 knots.  Inferior has 1 knot.  Cuff is inflated.  The tracheostomy site appears dry.  Dale trach collar in place Patient has a G-tube which was placed on 04/17/2018.  Data Review/Consults/Discussions:   Cr 10 Nephrology notes reviewed  Assessment: Jimmy Martin 62 y.o. male who presents with acute airway compromise in the setting of prior laryngeal cancer treated with chemo RT in 2017.  Awake tracheotomy emergently on 11/23.  There was significant necrotic debris in the supraglottic airway.  This in combination with swelling from recent intubation and prior laryngeal radiation therapy resulted  in a minimal glottic opening.  It is unclear to me whether this debris in the supraglottis represents a possible recurrence of his malignancy. We will work on scheduling a direct laryngoscopy and biopsy when he is safer from a renal standpoint after dialysis, likely in 2-3 days.   Patient is on the critical care service for multiple other comorbidities including hypertension, COPD, ESRD, dialysis with recent HCAP pneumonia.    Plan:  -Trach care per protocol, may change inner cannula PRN -Will plan for direct laryngoscopy/biopsy with one of my partners, likely Patsy Baltimore, MD, in 2-3 days if able.    Thank you for involving [facility] in the care of this patient. Should you need further assistance, please call our office at [phone].    Gavin Pound, MD

## 2018-04-21 NOTE — Progress Notes (Addendum)
[facility] Progress Note  Subjective: no new problems, did not get HD overnight due to staffing/ pt load  Vitals:   04/21/18 0300 04/21/18 0400 04/21/18 0500 04/21/18 0600  BP: (!) 98/59  (!) 101/58 (!) 102/59  Pulse: 79 78 78 82  Resp: (!) 0 '20 20 20  ' Temp: 97.9 F (36.6 C)     TempSrc: Oral     SpO2: 97% 97% 98% 97%  Weight:  77.3 kg    Height:        Inpatient medications: . arformoterol  15 mcg Nebulization BID  . budesonide (PULMICORT) nebulizer solution  0.5 mg Nebulization BID  . chlorhexidine gluconate (MEDLINE KIT)  15 mL Mouth Rinse BID  . Chlorhexidine Gluconate Cloth  6 each Topical Q0600  . feeding supplement (NEPRO CARB STEADY)  1,000 mL Oral Q24H  . feeding supplement (PRO-STAT SUGAR FREE 64)  60 mL Per Tube BID BM  . heparin  5,000 Units Subcutaneous Q8H  . ipratropium-albuterol  3 mL Nebulization Q6H  . ketamine (KETALAR) injection 15m/mL (IV use)  80 mg Intravenous Once  . mouth rinse  15 mL Mouth Rinse 10 times per day  . methylPREDNISolone (SOLU-MEDROL) injection  30 mg Intravenous BID  . nicotine  21 mg Transdermal Daily  . sodium chloride flush  3 mL Intravenous Q12H   . sodium chloride 10 mL/hr at 04/21/18 0600  . ceFEPime (MAXIPIME) IV Stopped (04/20/18 1908)  . dexmedetomidine 1.1 mcg/kg/hr (04/21/18 0600)  . famotidine (PEPCID) IV     fentaNYL (SUBLIMAZE) injection, fentaNYL (SUBLIMAZE) injection, midazolam, ondansetron (ZOFRAN) IV, sodium chloride flush  Iron/TIBC/Ferritin/ %Sat No results found for: IRON, TIBC, FERRITIN, IRONPCTSAT  Exam: Gen on vent, +trach, follows commands, alert No rash, cyanosis or gangrene Sclera anicteric, throat w/ trach+ETT  No jvd or bruits Chest clear bilat no rales or wheezing RRR no MRG Abd soft ntnd no mass or ascites +bs GU normal male MS no joint effusions or deformity Ext no LE edema Neuro moves all ext and follows commands R IJ TDC in place, bloody exit site  PTA meds:  -  amlodipine 10 qd/ furosemide 80 bid/ metoprolol xl 25 qd  - calcitriol 0.25 / sevelamer carb 800 tid ac/ sod bicarb 650 bid/ calc acetate 667 ac  - gabapentin 100 hs   ABG 7.26/ 49/ 357  Na 135  K 6.2  CO2 20  BUN 183  Cr 9.35  Phos 18  CXR - no active disease 11/23   Impression/ Plan: 1. ESRD - pt on PD ~ 5-6 years, has been changed now to HD [facility] Also it looks like his PD cath was removed. Pt is azotemic, plan HD this am and daily for 1-2 more days.  2. Laryngeal Ca - for 2-3 years, has rec'd chemo/ XRT acc to family 3. Resp failure/ COPD/ stridor - sp trach 4. SP PEG tube - done on 11/21 5. HTN - BP's soft, not on BP meds now, was getting norvasc/ BB prior to admit; no extra vol on exam. Keep even on HD.  6. MBD - phos very high, Rx w/ HD for now 7. Anemia ckd - Hb 11-12, no need ESA at this time 8. Nutrition - on tube feeds w/ Nepro per PEG 9. Hx COPD       RKelly SplinterMD [facility] pager [phone]  04/21/2018, 6:49 AM   Recent Labs  Lab  04/19/18 1937 04/20/18 0306  04/20/18 0527 04/20/18 1649 04/21/18 0535  NA 136  --   --  135  --   --   K 5.1  --   --  6.2*  --   --   CL 95*  --   --  94*  --   --   CO2 22  --   --  20*  --   --   GLUCOSE 93  --   --  117*  --   --   BUN 160*  --   --  183*  --   --   CREATININE 8.58* 9.16*  --  9.35*  --   --   CALCIUM 8.7*  --   --  8.5*  --   --   PHOS  --   --    < > 18.0* 18.6* 19.1*  ALBUMIN 2.6*  --   --   --   --   --   INR 1.08  --   --   --   --   --    < > = values in this interval not displayed.   Recent Labs  Lab 04/19/18 1937  AST 34  ALT 81*  ALKPHOS 74  BILITOT 1.2  PROT 6.1*   Recent Labs  Lab 04/19/18 1937 04/20/18 0306 04/20/18 0527  WBC 23.9* 22.4* 18.8*  NEUTROABS 21.3*  --   --   HGB 12.6* 12.1* 11.5*  HCT 40.0 38.8* 37.0*  MCV 92.4 94.2 94.9  PLT 200 176 152

## 2018-04-21 NOTE — Progress Notes (Signed)
CRITICAL VALUE ALERT  Critical Value:  K+ 6.5  Date & Time Notied:  04/21/18 0900  Provider Notified: Dr. Jonnie Finner  Orders Received/Actions taken: No new orders, currently receiving a dialysis treatment.

## 2018-04-21 NOTE — Progress Notes (Addendum)
NAME:  AEON KESSNER, MRN:  [id number], DOB:  10-Apr-1956, LOS: 1 ADMISSION DATE:  04/19/2018, CONSULTATION DATE:  11/23 REFERRING MD:  Dr. Laren Everts [facility], CHIEF COMPLAINT:  11/23  Brief History   62 year old male with history of laryngeal cancer s/p radiation, recently admitted in [place] and intubated for COPD + PNA. Treated, extubated, and transferred to [facility], where he developed stridor and was taken to [place] for urgent tracheostomy.   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  He was recently admitted to [facility] after presenting with complaints of shortness of breath x3 to 4 days.  This was felt to likely be a combination of COPD exacerbation and pneumonia.  He was treated with broad-spectrum antibiotics and BiPAP for ventilatory support however, he failed BiPAP and required intubation only 3 hours after he was admitted.  COPD was treated with scheduled DuoNeb, Pulmicort, and Brovana nebulizers.  Solu-Medrol has been tapered down to 30 mg every 12 hours by the time of discharge.  For his pneumonia he received 10 days of IV vancomycin and 13 days of IV cefepime with plans to continue Cefepime for at least another 4 to 5 days after discharge. Urine strep antigen was positive, sputum culture was positive for haemophilus species and beta-hemolytic strep.  He was also treated for Klebsiella UTI .  He had hemodialysis catheter placed and was transitioned from peritoneal dialysis.  He improved with ongoing treatment and was able to be extubated on November 18, however, post extubation course was complicated by ongoing dysphasia and probable aspiration.  He had a PEG tube placed to facilitate nutrition.   [facility] efforts.  However later that day he developed significant dyspnea and stridor.  He did not improve with nebulizers and racemic epinephrine.  Anesthesia at [facility] was contacted about potential intubation and he was ultimately taken to the operating room for tracheostomy placement.  PCCM was consulted regarding postoperative care.  Past Medical History  He has a past medical history of COPD (chronic obstructive pulmonary disease) ([place]), ESRD on peritoneal dialysis ([place]), HTN (hypertension), and Squamous cell carcinoma of larynx ([place]).   Significant Hospital Events   11/22 > taken to OR for urgent trach, admit to ICU  Consults:  ENT 11/23 >  Procedures:  Trach 11/23 > PEG 11/20 >  Significant Diagnostic Tests:    Micro Data:  Blood cx 11/23 > RVP 11/23 >  Antimicrobials:  Cefepime 11/9 Vanco 11/9 > 11/19  Interim history/subjective:    Objective   Blood pressure (!) 95/45, pulse 81, temperature 97.8 F (36.6 C), temperature source Oral, resp. rate 20, height 5' 7.99" (1.727 m), weight 77.4 kg, SpO2 97 %.    Vent Mode: PRVC FiO2 (%):  [40 %-100 %] 40 % Set Rate:  [14 bmp-20 bmp] 20 bmp Vt Set:  [520 mL-550 mL] 550 mL PEEP:  [5 cmH20] 5 cmH20 Pressure Support:  [10 cmH20] 10 cmH20 Plateau Pressure:  [13 cmH20-18 cmH20] 18 cmH20   Intake/Output Summary (Last 24 hours) at 04/21/2018 0044 Last data filed at 04/21/2018 0000 Gross per 24 hour  Intake 1537.56 ml  Output 440 ml  Net 1097.56 ml   Danley Danker  Weights   04/20/18 0100 04/20/18 1106  Weight: 77.4 kg 77.4 kg    Examination: General: chronically ill appearing male in NAD HENT: New trach in place site CDI Lungs: Clear Cardiovascular: RRR, no MRG Abdomen: Soft, non-distended, PEG Extremities: No acute deformity Neuro: appears agitated, moving arms GU: Foley  Resolved Hospital Problem list   Klebsiella UTI, Atrial fibrillation while critically ill,  NSTEMI  Assessment & Plan:   Airway compromise: in the setting of exophytic mass and poor vocal fold motility. Trach in place ENT to biopsy tomorrow  Acute on chronic hypoxemic respiratory failure - Will wean with less agitated, after HD.   COPD with acute exacerbation - steroids still weaning from prior admission (solumedrol 30mg  q 12 hours) can decrease or consider stopping this 11/24.Cont for now - Continue budesonide, brovana, duonebs.   Pneumonia: Some concern this has been due to recurrent aspiration. Admitted to [facility] initially for this. Urine strep was positive. Sputum culture showed hemophilia species and beta-hemolytic strep.  S/p 10 days vancomycin and 13 days cefepime - Continue cefepime through 11/27 as planned at discharge - Bl cx pending.   ESRD on HD: was on PD until admission to [facility] where he was transitioned to HD Needs HD today, very uremic. acidotic (anion gap) - last tx 11/22  Squamous cell carcinoma of larynx, history, however new findings may indicate recurrence. - Followed at [facility].  Dysphagia - TF via PEG  Best practice:  Diet: TF Pain/Anxiety/Delirium protocol (if indicated): PRN fentanyl VAP protocol (if indicated): per protocol DVT prophylaxis: heparin GI prophylaxis: pepcid Glucose control:  Mobility: bed rest Code Status: FULL  Labs   CBC: Recent Labs  Lab 04/19/18 1937 04/20/18 0306 04/20/18 0527  WBC 23.9* 22.4* 18.8*  NEUTROABS 21.3*  --   --   HGB 12.6* 12.1* 11.5*  HCT 40.0 38.8* 37.0*  MCV 92.4 94.2 94.9  PLT 200 176 409    Basic Metabolic Panel: Recent Labs  Lab 04/19/18 1937 04/20/18 0306 04/20/18 0527 04/20/18 1649  NA 136  --  135  --   K 5.1  --  6.2*  --   CL 95*  --  94*  --   CO2 22  --  20*  --   GLUCOSE 93  --  117*  --   BUN 160*  --  183*  --   CREATININE 8.58* 9.16* 9.35*  --   CALCIUM 8.7*  --  8.5*  --   MG  --   --  3.1* 3.4*  PHOS  --   --  18.0* 18.6*   GFR: Estimated Creatinine  Clearance: 8.1 mL/min (A) (by C-G formula based on SCr of 9.35 mg/dL (H)). Recent Labs  Lab 04/19/18 1937 04/20/18 0306 04/20/18 0527  WBC 23.9* 22.4* 18.8*  LATICACIDVEN  --  0.8 1.1    Liver Function Tests: Recent Labs  Lab 04/19/18 1937  AST 34  ALT 81*  ALKPHOS 74  BILITOT 1.2  PROT 6.1*  ALBUMIN 2.6*   No results for input(s): LIPASE, AMYLASE in the last 168 hours. No results for input(s): AMMONIA in the last 168 hours.  ABG    Component Value Date/Time   PHART 7.261 (L) 04/20/2018 0609   PCO2ART 49.2 (H) 04/20/2018 0609   PO2ART 357.0 (H) 04/20/2018 0609   HCO3 22.1 04/20/2018 0609   TCO2 24 04/20/2018 0609   ACIDBASEDEF 5.0 (H) 04/20/2018 0609   O2SAT 100.0 04/20/2018 0609     Coagulation Profile: Recent  Labs  Lab 04/19/18 1937  INR 1.08    Cardiac Enzymes: No results for input(s): CKTOTAL, CKMB, CKMBINDEX, TROPONINI in the last 168 hours.  HbA1C: No results found for: HGBA1C  CBG: Recent Labs  Lab 04/20/18 0735 04/20/18 1125 04/20/18 1541 04/20/18 2004 04/20/18 2349  GLUCAP 95 110* 129* 96 132*    Review of Systems:   Unable due to encephalopathy post op.   Past Medical History  He,  has a past medical history of COPD (chronic obstructive pulmonary disease) ([place]), ESRD on peritoneal dialysis ([place]), HTN (hypertension), and Squamous cell carcinoma of larynx ([place]).   Surgical History      Social History   reports that he has been smoking cigarettes. He has been smoking about 1.00 pack per day. He does not have any smokeless tobacco history on file.   Family History   His family history is not on file.   Allergies Allergies  Allergen Reactions  . Bactrim [Sulfamethoxazole-Trimethoprim] Rash  . Ceftriaxone Rash  . Sulfa Antibiotics Rash  . Tylenol [Acetaminophen] Rash     Home Medications  Prior to Admission medications   Not on File     Critical care time: 50 min

## 2018-04-21 NOTE — Anesthesia Preprocedure Evaluation (Signed)
Anesthesia Evaluation  Patient identified by MRN, date of birth, ID bandGeneral Assessment Comment:Respiratory distress   Reviewed: Unable to perform ROS - Chart review onlyPreop documentation limited or incomplete due to emergent nature of procedure.  History of Anesthesia Complications Negative for: history of anesthetic complications  Airway Mallampati: IV  TM Distance: <3 FB Neck ROM: Limited    Dental  (+) Dental Advisory Given   Pulmonary COPD, Current Smoker,  Laryngeal Ca with airway obstruction     + decreased breath sounds unstable     Cardiovascular hypertension, Pt. on medications  Rhythm:Regular     Neuro/Psych negative neurological ROS  negative psych ROS   GI/Hepatic Neg liver ROS, G tube   Endo/Other  negative endocrine ROS  Renal/GU ESRF and DialysisRenal disease     Musculoskeletal   Abdominal   Peds  Hematology   Anesthesia Other Findings   Reproductive/Obstetrics                             Anesthesia Physical Anesthesia Plan  ASA: IV and emergent  Anesthesia Plan: MAC   Post-op Pain Management:    Induction:   PONV Risk Score and Plan: 0  Airway Management Planned: Nasal Cannula and Tracheostomy  Additional Equipment:   Intra-op Plan:   Post-operative Plan:   Informed Consent:   History available from chart only and Only emergency history available  Plan Discussed with: CRNA and Surgeon  Anesthesia Plan Comments: (Called to int[facility]ate patient in [facility]. Arrived to a patient in respiratory distress and near occlusion of airway from laryngeal Ca. No [facility] info available form [facility] and no surgical airway support available. Requested patient be transferred to [facility] for ENT/Anesthesia airway vs awake trach. )        Anesthesia Quick Evaluation

## 2018-04-21 NOTE — Anesthesia Postprocedure Evaluation (Signed)
Anesthesia Post Note  Patient: Jimmy Martin  Procedure(s) Performed: TRACHEOSTOMY (N/A )     Patient location during evaluation: ICU Anesthesia Type: MAC Level of consciousness: patient cooperative Pain management: pain level controlled Vital Signs Assessment: post-procedure vital signs reviewed and stable Respiratory status: spontaneous breathing, respiratory function stable and patient connected to tracheostomy mask oxygen Cardiovascular status: stable and blood pressure returned to baseline Postop Assessment: no apparent nausea or vomiting Anesthetic complications: no    Last Vitals:  Vitals:   04/21/18 1116 04/21/18 1125  BP:  (!) 155/78  Pulse:  (!) 114  Resp: (!) 28 (!) 25  Temp: 36.4 C 36.8 C  SpO2:  100%    Last Pain:  Vitals:   04/21/18 1125  TempSrc: Oral  PainSc:                  Marnette Perkins

## 2018-04-22 ENCOUNTER — Encounter (HOSPITAL_COMMUNITY): Payer: Self-pay | Admitting: Anesthesiology

## 2018-04-22 ENCOUNTER — Other Ambulatory Visit: Payer: Self-pay | Admitting: Otolaryngology

## 2018-04-22 ENCOUNTER — Inpatient Hospital Stay
Admission: AD | Admit: 2018-04-22 | Discharge: 2018-04-24 | Payer: Medicare Other | Source: Ambulatory Visit | Attending: Internal Medicine | Admitting: Internal Medicine

## 2018-04-22 ENCOUNTER — Other Ambulatory Visit (HOSPITAL_COMMUNITY): Payer: Self-pay

## 2018-04-22 ENCOUNTER — Encounter (HOSPITAL_COMMUNITY): Payer: Self-pay | Admitting: Otolaryngology

## 2018-04-22 ENCOUNTER — Institutional Professional Consult (permissible substitution) (HOSPITAL_COMMUNITY): Payer: Self-pay | Admitting: Anesthesiology

## 2018-04-22 DIAGNOSIS — J9621 Acute and chronic respiratory failure with hypoxia: Secondary | ICD-10-CM

## 2018-04-22 DIAGNOSIS — Z992 Dependence on renal dialysis: Secondary | ICD-10-CM

## 2018-04-22 DIAGNOSIS — Z930 Tracheostomy status: Secondary | ICD-10-CM

## 2018-04-22 DIAGNOSIS — J189 Pneumonia, unspecified organism: Secondary | ICD-10-CM

## 2018-04-22 DIAGNOSIS — G9340 Encephalopathy, unspecified: Secondary | ICD-10-CM

## 2018-04-22 DIAGNOSIS — J441 Chronic obstructive pulmonary disease with (acute) exacerbation: Secondary | ICD-10-CM

## 2018-04-22 DIAGNOSIS — Z931 Gastrostomy status: Secondary | ICD-10-CM

## 2018-04-22 DIAGNOSIS — N186 End stage renal disease: Secondary | ICD-10-CM

## 2018-04-22 LAB — POCT I-STAT 3, ART BLOOD GAS (G3+)
Acid-base deficit: 1 mmol/L (ref 0.0–2.0)
Bicarbonate: 25 mmol/L (ref 20.0–28.0)
O2 Saturation: 98 %
PCO2 ART: 45.1 mmHg (ref 32.0–48.0)
PH ART: 7.352 (ref 7.350–7.450)
PO2 ART: 120 mmHg — AB (ref 83.0–108.0)
Patient temperature: 98.6
TCO2: 26 mmol/L (ref 22–32)

## 2018-04-22 LAB — POCT I-STAT, CHEM 8
BUN: 109 mg/dL — AB (ref 6–20)
CALCIUM ION: 1.06 mmol/L — AB (ref 1.15–1.40)
CHLORIDE: 99 mmol/L (ref 98–111)
Creatinine, Ser: 6.8 mg/dL — ABNORMAL HIGH (ref 0.61–1.24)
GLUCOSE: 142 mg/dL — AB (ref 70–99)
HEMATOCRIT: 31 % — AB (ref 39.0–52.0)
Hemoglobin: 10.5 g/dL — ABNORMAL LOW (ref 13.0–17.0)
POTASSIUM: 4.7 mmol/L (ref 3.5–5.1)
SODIUM: 133 mmol/L — AB (ref 135–145)
TCO2: 25 mmol/L (ref 22–32)

## 2018-04-22 LAB — CULTURE, RESPIRATORY

## 2018-04-22 LAB — CULTURE, RESPIRATORY W GRAM STAIN: Culture: NORMAL

## 2018-04-22 LAB — GLUCOSE, CAPILLARY
GLUCOSE-CAPILLARY: 108 mg/dL — AB (ref 70–99)
GLUCOSE-CAPILLARY: 109 mg/dL — AB (ref 70–99)
Glucose-Capillary: 107 mg/dL — ABNORMAL HIGH (ref 70–99)
Glucose-Capillary: 130 mg/dL — ABNORMAL HIGH (ref 70–99)
Glucose-Capillary: 130 mg/dL — ABNORMAL HIGH (ref 70–99)

## 2018-04-22 MED ORDER — CHLORHEXIDINE GLUCONATE 0.12% ORAL RINSE (MEDLINE KIT): 15.0000 mL | Freq: Two times a day (BID) | OROMUCOSAL | 0 refills | Status: DC

## 2018-04-22 MED ORDER — SODIUM CHLORIDE 0.9% FLUSH: 3.0000 mL | Freq: Two times a day (BID) | INTRAVENOUS | Status: DC

## 2018-04-22 MED ORDER — HEPARIN SODIUM (PORCINE) 1000 UNIT/ML DIALYSIS: 3000.0000 [IU] | INTRAMUSCULAR | Status: DC | PRN

## 2018-04-22 MED ORDER — NICOTINE 21 MG/24HR TD PT24: 21.0000 mg | MEDICATED_PATCH | Freq: Every day | TRANSDERMAL | 0 refills | Status: DC

## 2018-04-22 MED ORDER — ORAL CARE MOUTH RINSE: 15.0000 mL | OROMUCOSAL | 0 refills | Status: DC

## 2018-04-22 MED ORDER — NEPRO/CARBSTEADY PO LIQD: 1000.0000 mL | ORAL | 0 refills | Status: DC

## 2018-04-22 MED ORDER — BUDESONIDE 0.5 MG/2ML IN SUSP: 0.5000 mg | Freq: Two times a day (BID) | RESPIRATORY_TRACT | 12 refills | Status: DC

## 2018-04-22 MED ORDER — IOPAMIDOL (ISOVUE-300) INJECTION 61%
INTRAVENOUS | Status: AC
  Filled 2018-04-22: qty 50

## 2018-04-22 MED ORDER — CHLORHEXIDINE GLUCONATE CLOTH 2 % EX PADS: 6.0000 | MEDICATED_PAD | Freq: Every day | CUTANEOUS | Status: DC

## 2018-04-22 MED ORDER — PRO-STAT SUGAR FREE PO LIQD: 60.0000 mL | Freq: Two times a day (BID) | ORAL | 0 refills | Status: DC

## 2018-04-22 MED ORDER — SODIUM CHLORIDE 0.9 % IV SOLN: 1.0000 g | INTRAVENOUS | Status: DC

## 2018-04-22 MED ORDER — HEPARIN SODIUM (PORCINE) 1000 UNIT/ML IJ SOLN
INTRAMUSCULAR | Status: AC
  Administered 2018-04-22: 3000 [IU] via INTRAVENOUS_CENTRAL
  Filled 2018-04-22: qty 7

## 2018-04-22 MED ORDER — ARFORMOTEROL TARTRATE 15 MCG/2ML IN NEBU: 15.0000 ug | INHALATION_SOLUTION | Freq: Two times a day (BID) | RESPIRATORY_TRACT | Status: DC

## 2018-04-22 MED ORDER — SODIUM CHLORIDE 0.9% FLUSH: 3.0000 mL | INTRAVENOUS | Status: DC | PRN

## 2018-04-22 MED ORDER — HEPARIN SODIUM (PORCINE) 5000 UNIT/ML IJ SOLN: 5000.0000 [IU] | Freq: Three times a day (TID) | INTRAMUSCULAR | Status: DC

## 2018-04-22 MED ORDER — IPRATROPIUM-ALBUTEROL 0.5-2.5 (3) MG/3ML IN SOLN: 3.0000 mL | Freq: Four times a day (QID) | RESPIRATORY_TRACT | Status: DC

## 2018-04-22 MED ORDER — FENTANYL CITRATE (PF) 100 MCG/2ML IJ SOLN: 50.0000 ug | INTRAMUSCULAR | 0 refills | Status: DC | PRN

## 2018-04-22 NOTE — Progress Notes (Signed)
PT Cancellation Note  Patient Details Name: Jimmy Martin MRN: [id number] DOB: Jul 10, 1955   Cancelled Treatment:    Reason Eval/Treat Not Completed: Patient at procedure or test/unavailable. Pt currently on HD.    Shary Decamp [facility] 04/22/2018, 11:14 AM [place] Pager [phone] Office [phone]

## 2018-04-22 NOTE — Progress Notes (Signed)
100 mL fentanyl wasted down bathroom sink with Rubye Beach, RN.

## 2018-04-22 NOTE — Progress Notes (Signed)
RT NOTE: RT transported patient on ventilator from room 2H09 to [place] with no apparent complications. Report given to RT on floor.

## 2018-04-22 NOTE — Progress Notes (Signed)
[facility] Progress Note  Subjective:  HD this AM, tol with no UF  Fam reports mentation improving  Using [facility]  [facility]  Vitals:   04/22/18 1111 04/22/18 1115 04/22/18 1120 04/22/18 1130  BP:  (!) 180/81 118/65 (!) 141/71  Pulse:  (!) 121 (!) 103 (!) 107  Resp:  (!) 23 19 (!) 21  Temp:    98.8 F (37.1 C)  TempSrc:      SpO2: 97% 98% 97% 98%  Weight:      Height:        Inpatient medications: . arformoterol  15 mcg Nebulization BID  . budesonide (PULMICORT) nebulizer solution  0.5 mg Nebulization BID  . chlorhexidine gluconate (MEDLINE KIT)  15 mL Mouth Rinse BID  . Chlorhexidine Gluconate Cloth  6 each Topical Q0600  . Chlorhexidine Gluconate Cloth  6 each Topical Q0600  . feeding supplement (NEPRO CARB STEADY)  1,000 mL Oral Q24H  . feeding supplement (PRO-STAT SUGAR FREE 64)  60 mL Per Tube BID BM  . heparin  5,000 Units Subcutaneous Q8H  . ipratropium-albuterol  3 mL Nebulization Q6H  . ketamine (KETALAR) injection 71m/mL (IV use)  80 mg Intravenous Once  . mouth rinse  15 mL Mouth Rinse 10 times per day  . methylPREDNISolone (SOLU-MEDROL) injection  30 mg Intravenous BID  . nicotine  21 mg Transdermal Daily  . sodium chloride flush  3 mL Intravenous Q12H   . sodium chloride 10 mL/hr at 04/22/18 1126  . ceFEPime (MAXIPIME) IV Stopped (04/21/18 1828)  . dexmedetomidine Stopped (04/22/18 1008)  . famotidine (PEPCID) IV Stopped (04/22/18 0835)  . fentaNYL infusion INTRAVENOUS Stopped (04/22/18 1008)   fentaNYL (SUBLIMAZE) injection, heparin, midazolam, ondansetron (ZOFRAN) IV, sodium chloride flush  Iron/TIBC/Ferritin/ %Sat No results found for: IRON, TIBC, FERRITIN, IRONPCTSAT  Exam: Gen on vent, +trach, follows commands, alert No rash, cyanosis or gangrene Sclera anicteric, throat w/ trach+ETT  No jvd or bruits Chest clear bilat no rales or wheezing RRR no MRG Abd soft ntnd no mass or ascites +bs GU normal male MS no  joint effusions or deformity Ext no LE edema Neuro moves all ext and follows commands R IJ TDC in place, bloody exit site  PTA meds:  - amlodipine 10 qd/ furosemide 80 bid/ metoprolol xl 25 qd  - calcitriol 0.25 / sevelamer carb 800 tid ac/ sod bicarb 650 bid/ calc acetate 667 ac  - gabapentin 100 hs   ABG 7.26/ 49/ 357  Na 135  K 6.2  CO2 20  BUN 183  Cr 9.35  Phos 18  CXR - no active disease 11/23   Impression/ Plan: 1. ESRD - pt on PD ~ 5-6 years, has been changed now to HD and [facility] PD cath was removed. [facility] L[facility] - for 2-3 years, has rec'd chemo/ XRT acc to family 3. Resp failure/ COPD/ stridor - sp trach 4. SP PEG tube - done on 11/21 5. HTN - BP's soft, not on BP meds now, was getting norvasc/ BB prior to admit; no extra vol on exam.  6. MBD - phos very high, Rx w/ HD for now; improved to 9.1.  Make sure no oral sodium phosphate products given, monitor 7. Anemia ckd - Hb 10-12, no need ESA at this time, monitor 8. Nutrition - on tube feeds w/ Nepro per PEG, contributes to azotemia  9. Hx COPD      Recent Labs  Lab 04/19/18 1937  04/21/18 0749 04/21/18 1150 04/21/18 1823 04/22/18 0338  NA 136   < > 138 134*  --  133*  K 5.1   < > 6.5* 3.4*  --  4.7  CL 95*   < > 98 96*  --  99  CO2 22   < > 17* 24  --   --   GLUCOSE 93   < > 117* 102*  --  142*  BUN 160*   < > 232* 63*  --  109*  CREATININE 8.58*   < > 10.17* 3.74*  --  6.80*  CALCIUM 8.7*   < > 7.9* 8.0*  --   --   PHOS  --    < > 19.2*  --  9.1*  --   ALBUMIN 2.6*  --  2.2*  --   --   --   INR 1.08  --   --   --   --   --    < > = values in this interval not displayed.   Recent Labs  Lab 04/19/18 1937  AST 34  ALT 81*  ALKPHOS 74  BILITOT 1.2  PROT 6.1*   Recent Labs  Lab 04/19/18 1937 04/20/18 0306 04/20/18 0527 04/22/18 0338  WBC 23.9* 22.4* 18.8*  --   NEUTROABS 21.3*  --   --   --   HGB  12.6* 12.1* 11.5* 10.5*  HCT 40.0 38.8* 37.0* 31.0*  MCV 92.4 94.2 94.9  --   PLT 200 176 152  --

## 2018-04-22 NOTE — Discharge Summary (Signed)
Physician Discharge Summary  Patient ID: Jimmy Martin MRN: [id number] DOB/AGE: [birth year] 62 y.o.  Admit date: 04/19/2018 Discharge date: 04/22/2018    Discharge Diagnoses:  Airway Obstruction  Tracheostomy Status  Acute on Chronic Hypoxic Respiratory Failure  COPD with Acute Exacerbation  PNA (recent dx, strep antigen positive) Atrial Fibrillation  NSTEMI ESRD on HD  Squamous Cell Carcinoma of the Larynx  Dysphagia  PEG Status                                                                     DISCHARGE PLAN BY DIAGNOSIS     Airway Obstruction  Tracheostomy Status  Acute on Chronic Hypoxic Respiratory Failure  COPD with Acute Exacerbation  PNA (recent dx, strep antigen positive)  Discharge Plan: Trach care per protocol, trach 11/23 Remove trach sutures 10 days post insertion  Follow up with ENT at discharge  PRVC 8 cc/kg  Wean PEEP / FiO2 for sats >90% Wean steroids to off  Complete antibiotics for PNA  Wean protocol per [facility] Follow intermittent CXR    Atrial Fibrillation  NSTEMI   Discharge Plan: No acute follow up Tele monitoring while inpatient   ESRD on HD   Discharge Plan: HD per Nephrology, last HD 11/25   Squamous Cell Carcinoma of the Larynx   Discharge Plan: Per ENT, followed at [facility]  Dysphagia  PEG Status   Discharge Plan: Continue TF per PEG Aspiration precautions                     DISCHARGE SUMMARY   63 year old male with COPD, hypertension, ESRD on PD, 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  The patient was recently admitted to [facility] with complaints of shortness of breath for 3 to 4 days.  Based on chart review it was felt to be a combination of COPD exacerbation and suspected pneumonia.  He was treated with broad-spectrum antibiotics and BiPAP for  ventilatory support.  However he failed BiPAP and required intubation 3 hours after admission.  His COPD was treated with DuoNeb, Pulmicort and Brovana.  Solu-Medrol was initiated and tapered.  He was treated with 10 days of IV vancomycin and 13 days IV cefepime.  Urine strep antigen was positive.  Sputum culture was positive for Haemophilus species and beta-hemolytic strep.  He was also treated for Klebsiella UTI.  Hemodialysis catheter was placed during admission.  The patient improved with therapy and was able to be extubated on November 18 but extubation course was complicated by ongoing dysphasia and suspected aspiration.  PEG tube was placed to facilitate nutrition.  [facility].  However, later on the day of [facility] he developed dyspnea and worsening stridor.  This did not improve with nebulizers and racemic epinephrine.  Anesthesia at [facility] was contacted regarding possible intubation and he was ultimately taken to the operating room for tracheostomy placement.    PCCM was consulted for postoperative care in the ICU.  Sedation was weaned to off during ICU course.  Therapies for pneumonia  were continued.  ICU course notable for NSTEMI and Atrial Fibrillation.  The patient completed hemodialysis on 11/25 AM without difficulty.  [facility] was [facility].            SIGNIFICANT DIAGNOSTIC STUDIES   MICRO DATA  BCx2 11/23 >>  RVP 11/23 >> positive for rhinovirus  Hep B Surface antigen 11/24 >> negative  HIV 11/23 >> negative   ANTIBIOTICS Cefepime 11/9 >>  Vanco 11/9 >> 11/19  CONSULTS ENT 11/22 >> taken to OR for urgent tracheostomy, admitted to [facility] ICU  TUBES / LINES Trach 11/23 > PEG 11/20 >   Discharge Exam: General: chronically ill appearing male lying in bed in NAD Neuro: Awake, alert / interactive,  follows commands CV: s1s2 rrr, no m/r/g PULM: even/non-labored on vent, clear bilaterally, trach midline c/d/i GI: soft, non-tender, PEG in place c/d/i, no abd tenderness on exam  Extremities: warm/dry, no edema   Vitals:   04/22/18 1111 04/22/18 1115 04/22/18 1120 04/22/18 1130  BP:  (!) 180/81 118/65 (!) 141/71  Pulse:  (!) 121 (!) 103 (!) 107  Resp:  (!) 23 19 (!) 21  Temp:    98.8 F (37.1 C)  TempSrc:      SpO2: 97% 98% 97% 98%  Weight:      Height:         Discharge Labs  BMET Recent Labs  Lab 04/19/18 1937 04/20/18 0306 04/20/18 0527 04/20/18 1649 04/21/18 0535 04/21/18 0749 04/21/18 1150 04/21/18 1823 04/22/18 0338  NA 136  --  135  --   --  138 134*  --  133*  K 5.1  --  6.2*  --   --  6.5* 3.4*  --  4.7  CL 95*  --  94*  --   --  98 96*  --  99  CO2 22  --  20*  --   --  17* 24  --   --   GLUCOSE 93  --  117*  --   --  117* 102*  --  142*  BUN 160*  --  183*  --   --  232* 63*  --  109*  CREATININE 8.58* 9.16* 9.35*  --   --  10.17* 3.74*  --  6.80*  CALCIUM 8.7*  --  8.5*  --   --  7.9* 8.0*  --   --   MG  --   --  3.1* 3.4* 3.6*  --  2.3 2.5*  --   PHOS  --   --  18.0* 18.6* 19.1* 19.2*  --  9.1*  --     CBC Recent Labs  Lab 04/19/18 1937 04/20/18 0306 04/20/18 0527 04/22/18 0338  HGB 12.6* 12.1* 11.5* 10.5*  HCT 40.0 38.8* 37.0* 31.0*  WBC 23.9* 22.4* 18.8*  --   PLT 200 176 152  --     Anti-Coagulation Recent Labs  Lab 04/19/18 1937  INR 1.08    Discharge Instructions    Call MD for:  difficulty breathing, headache or visual disturbances   Complete by:  As directed    Call MD for:  extreme fatigue   Complete by:  As directed    Call MD for:  hives   Complete by:  As directed    Call MD for:  persistant dizziness or light-headedness   Complete by:  As directed    Call MD for:  persistant nausea  and vomiting   Complete by:  As directed    Call MD for:  redness, tenderness, or signs of infection (pain, swelling, redness, odor or  green/yellow discharge around incision site)   Complete by:  As directed    Call MD for:  severe uncontrolled pain   Complete by:  As directed    Call MD for:  temperature >100.4   Complete by:  As directed    Diet - low sodium heart healthy   Complete by:  As directed    Increase activity slowly   Complete by:  As directed        [place] Follow up.   Why:  Follow up with [facility] care team after discharge.  Contact information: [place] [phone]           Allergies as of 04/22/2018      Reactions   Bactrim [sulfamethoxazole-trimethoprim] Rash   Ceftriaxone Rash   Sulfa Antibiotics Rash   Tylenol [acetaminophen] Rash      Medication List    STOP taking these medications   albuterol 108 (90 Base) MCG/ACT inhaler Commonly known as:  PROVENTIL HFA;VENTOLIN HFA   amLODipine 10 MG tablet Commonly known as:  NORVASC   furosemide 80 MG tablet Commonly known as:  LASIX   gabapentin 100 MG capsule Commonly known as:  NEURONTIN   metoprolol succinate 25 MG 24 hr tablet Commonly known as:  TOPROL-XL   sodium bicarbonate 650 MG tablet     TAKE these medications   arformoterol 15 MCG/2ML Nebu Commonly known as:  BROVANA Take 2 mLs (15 mcg total) by nebulization 2 (two) times daily.   budesonide 0.5 MG/2ML nebulizer solution Commonly known as:  PULMICORT Take 2 mLs (0.5 mg total) by nebulization 2 (two) times daily.   calcitRIOL 0.25 MCG capsule Commonly known as:  ROCALTROL Take 0.25 mcg by mouth once a week.   calcium acetate 667 MG capsule Commonly known as:  PHOSLO Take 667 mg by mouth 3 (three) times daily with meals.   ceFEPIme 1 g in sodium chloride 0.9 % 100 mL Inject 1 g into the vein daily.   chlorhexidine gluconate (MEDLINE KIT) 0.12 % solution Commonly known as:  PERIDEX 15 mLs by Mouth Rinse route 2 (two) times daily.    Chlorhexidine Gluconate Cloth 2 % Pads Apply 6 each topically daily at 6 (six) AM. Start taking on:  04/23/2018   Chlorhexidine Gluconate Cloth 2 % Pads Apply 6 each topically daily at 6 (six) AM. Start taking on:  04/23/2018   feeding supplement (NEPRO CARB STEADY) Liqd Take 1,000 mLs by mouth daily.   feeding supplement (PRO-STAT SUGAR FREE 64) Liqd Place 60 mLs into feeding tube 2 (two) times daily between meals.   fentaNYL 100 MCG/2ML injection Commonly known as:  SUBLIMAZE Inject 1 mL (50 mcg total) into the vein every 2 (two) hours as needed (to maintain RASS goal.).   heparin 1000 unit/mL Soln injection 3 mLs (3,000 Units total) by Dialysis route as needed (in dialysis).   heparin 5000 UNIT/ML injection Inject 1 mL (5,000 Units total) into the skin every 8 (eight) hours.   ipratropium-albuterol 0.5-2.5 (3) MG/3ML Soln Commonly known as:  DUONEB Take 3 mLs by nebulization every 6 (six) hours.   mouth rinse Liqd solution 15 mLs by Mouth Rinse route every 2 (two) hours.   nicotine 21 mg/24hr patch Commonly  known as:  NICODERM CQ - dosed in mg/24 hours Place 1 patch (21 mg total) onto the skin daily. Start taking on:  04/23/2018   RENVELA 0.8 g Pack packet Generic drug:  sevelamer carbonate Take 0.8 g by mouth 3 (three) times daily with meals.   sodium chloride flush 0.9 % Soln Commonly known as:  NS Inject 3 mLs into the vein every 12 (twelve) hours.   sodium chloride flush 0.9 % Soln Commonly known as:  NS Inject 3 mLs into the vein as needed.         Disposition:  [facility]   Discharged Condition: DENYS SALINGER has met maximum benefit of inpatient care and is medically stable and cleared for discharge.  Patient is pending follow up as above.      Time spent on disposition:  35 Minutes.   Signed: Noe Gens, NP-C [facility] Pgr: [phone] Office: [phone]

## 2018-04-22 NOTE — Care Management Note (Signed)
Case Management Note  Patient Details  Name: Jimmy Martin MRN: [id number] Date of Birth: [birth year]  Subjective/Objective: 62 yo male presented for urgent tracheostomy.                   Action/Plan: CM received incoming call from [name], [employer], stating patient was at [facility] PTA; patient is able to return back and the facility, with a bed available today at 1500 if attending is agreeable to transfer; DC order and summary needed. CM contacted Dr. Nelda Marseille via [place] secured chat and informed of bed availability at [facility], with DC order in [place]. CM updated Nira Conn, [place] charge nurse on POC. No further needs from CM.   Expected Discharge Date:  04/22/18               Expected Discharge Plan:  Long Term Acute Care (LTAC)  In-House Referral:  NA  Discharge planning Services  CM Consult  Post Acute Care Choice:  Resumption of Svcs/PTA Provider Choice offered to:  NA  DME Arranged:  N/A DME Agency:  NA  HH Arranged:  NA HH Agency:  NA  Status of Service:  Completed, signed off  If discussed at [place] of Stay Meetings, dates discussed:    Additional Comments:  Midge Minium RN, BSN, NCM-BC, ACM-RN [phone] 04/22/2018, 12:05 PM

## 2018-04-22 NOTE — Progress Notes (Signed)
NAME:  Jimmy Martin, MRN:  [id number], DOB:  08/04/1955, LOS: 2 ADMISSION DATE:  04/19/2018, CONSULTATION DATE:  11/23 REFERRING MD:  Dr. Laren Everts [facility], CHIEF COMPLAINT:  11/23  Brief History   62 year old male with history of laryngeal cancer s/p radiation, recently admitted in [place] and intubated for COPD + PNA. Treated, extubated, and transferred to [facility], where he developed stridor and was taken to [place] for urgent tracheostomy.   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  He was recently admitted to [facility] after presenting with complaints of shortness of breath x3 to 4 days.  This was felt to likely be a combination of COPD exacerbation and pneumonia.  He was treated with broad-spectrum antibiotics and BiPAP for ventilatory support however, he failed BiPAP and required intubation only 3 hours after he was admitted.  COPD was treated with scheduled DuoNeb, Pulmicort, and Brovana nebulizers.  Solu-Medrol has been tapered down to 30 mg every 12 hours by the time of discharge.  For his pneumonia he received 10 days of IV vancomycin and 13 days of IV cefepime with plans to continue Cefepime for at least another 4 to 5 days after discharge. Urine strep antigen was positive, sputum culture was positive for haemophilus species and beta-hemolytic strep.  He was also treated for Klebsiella UTI .  He had hemodialysis catheter placed and was transitioned from peritoneal dialysis.  He improved with ongoing treatment and was able to be extubated on November 18, however, post extubation course was complicated by ongoing dysphasia and probable aspiration.  He had a PEG tube placed to facilitate nutrition.   [facility]  efforts.  However later that day he developed significant dyspnea and stridor.  He did not improve with nebulizers and racemic epinephrine.  Anesthesia at [facility] was contacted about potential intubation and he was ultimately taken to the operating room for tracheostomy placement.  PCCM was consulted regarding postoperative care.  Past Medical History  He has a past medical history of COPD (chronic obstructive pulmonary disease) ([place]), ESRD on peritoneal dialysis ([place]), HTN (hypertension), and Squamous cell carcinoma of larynx ([place]).   Significant Hospital Events   11/22 > taken to OR for urgent trach, admit to ICU  Consults:  ENT 11/23 >11/23 Trach 11/23 via ENT Nephrology 11/23  Procedures:  Lurline Idol 11/23 > PEG 11/20 >  Significant Diagnostic Tests:    Micro Data:  Blood cx 11/23 > RVP 11/23 >  Antimicrobials:  Cefepime 11/9 Vanco 11/9 > 11/19  Interim history/subjective:    Objective   Blood pressure (!) 148/77, pulse 100, temperature 98.2 F (36.8 C), temperature source Oral, resp. rate 20, height 5' 7.99" (1.727 m), weight 77.9 kg, SpO2 100 %.    Vent Mode: PRVC FiO2 (%):  [40 %] 40 % Set Rate:  [16 bmp-20 bmp] 16 bmp Vt Set:  [550 mL] 550 mL PEEP:  [5 cmH20] 5 cmH20 Plateau Pressure:  [14 cmH20-18 cmH20] 14 cmH20   Intake/Output Summary (Last 24 hours) at 04/22/2018 0952 Last data filed at 04/22/2018 0900 Gross per 24 hour  Intake 1992.23 ml  Output -475 ml  Net 2467.23 ml   Filed Weights   04/21/18 1125 04/22/18 0000 04/22/18 0459  Weight:  79.1 kg 77.9 kg 77.9 kg    Examination: General: Chronically ill appearing male, mild painful distress at trach site HENT: /AT, PERRL, EOM-I and MMM, trach in place Lungs: CTA bilaterally Cardiovascular: Regular, tachy and -M/R/G Abdomen: Soft, NT, ND and +BS Extremities: -edema and -tenderness Neuro: Alert and interactive, moving all ext to command GU: Foley Skin: [facility] Problem list     Klebsiella UTI, Atrial fibrillation while critically ill, NSTEMI  Assessment & Plan:   Airway compromise: in the setting of exophytic mass and poor vocal fold motility. Trach in place ENT to biopsy per ENT Trach in place  Acute on chronic hypoxemic respiratory failure - PS and advance to TC as able today - Titrate O2 for sat of 88-92%  COPD with acute exacerbation - steroids still weaning from prior admission (solumedrol 30mg  q 12 hours).  Resp Acidosis (combined with metabolic), will cont steroids for now.  CO2 around 50 may be near his baseline however.   - Continue budesonide, brovana, duonebs.  - Begin PS trials and advance to TC today as able, respiratory failure was due to airway compromise - Recheck ABG, wean if looks better.    Recent Pneumonia: Some concern this has been due to recurrent aspiration. Admitted to [facility] initially for this. Urine strep was positive. Sputum culture showed hemophilia species and beta-hemolytic strep.  S/p 10 days vancomycin and 13 days cefepime - Continue cefepime through 11/27 as planned at discharge - Blood cx pending.  - I reviewed CXR myself, clear with trach in good position  ESRD on HD: was on PD until admission to [facility] where he was transitioned to HD Needs HD today, very uremic. acidotic (anion gap) - Last tx 11/22 - HD today  Squamous cell carcinoma of larynx, history, however new findings may indicate recurrence. - Followed at [facility]. ENT was going to biopsy in OR today, but unfortunately did not get dialysis as we had hoped overnight, now will tentantively plan for biopsy this coming week.   - Will need to discuss GOC  Dysphagia - TF via PEG  Discussed with SSH-MD, will d/c sedation drips, finish HD then transfer to [facility] when bed is available  Best practice:  Diet: TF Pain/Anxiety/Delirium protocol (if indicated): PRN fentanyl.  On precedex for agitation.   VAP protocol (if indicated): per protocol DVT  prophylaxis: heparin GI prophylaxis: pepcid Glucose control:  Mobility: bed rest Code Status: FULL  The patient is critically ill with multiple organ systems failure and requires high complexity decision making for assessment and support, frequent evaluation and titration of therapies, application of advanced monitoring technologies and extensive interpretation of multiple databases.   Critical Care Time devoted to patient care services described in this note is  32  Minutes. This time reflects time of care of this signee Dr Jennet Maduro. This critical care time does not reflect procedure time, or teaching time or supervisory time of PA/NP/Med student/Med Resident etc but could involve care discussion time.  Rush Farmer, M.D. [facility] Pulmonary/Critical Care Medicine. Pager: [phone]. After hours pager: [phone].

## 2018-04-23 ENCOUNTER — Encounter: Admission: AD | Payer: Self-pay | Source: Ambulatory Visit | Attending: Internal Medicine

## 2018-04-23 ENCOUNTER — Encounter: Payer: Self-pay | Admitting: Internal Medicine

## 2018-04-23 DIAGNOSIS — Z930 Tracheostomy status: Secondary | ICD-10-CM

## 2018-04-23 DIAGNOSIS — N186 End stage renal disease: Secondary | ICD-10-CM

## 2018-04-23 DIAGNOSIS — G9340 Encephalopathy, unspecified: Secondary | ICD-10-CM

## 2018-04-23 DIAGNOSIS — J9621 Acute and chronic respiratory failure with hypoxia: Secondary | ICD-10-CM

## 2018-04-23 DIAGNOSIS — J189 Pneumonia, unspecified organism: Secondary | ICD-10-CM

## 2018-04-23 DIAGNOSIS — Z992 Dependence on renal dialysis: Secondary | ICD-10-CM

## 2018-04-23 LAB — BASIC METABOLIC PANEL
Anion gap: 10 (ref 5–15)
BUN: 63 mg/dL — ABNORMAL HIGH (ref 6–20)
CALCIUM: 7.6 mg/dL — AB (ref 8.9–10.3)
CO2: 24 mmol/L (ref 22–32)
CREATININE: 4.13 mg/dL — AB (ref 0.61–1.24)
Chloride: 102 mmol/L (ref 98–111)
GFR, EST AFRICAN AMERICAN: 17 mL/min — AB (ref >60)
GFR, EST NON AFRICAN AMERICAN: 15 mL/min — AB (ref >60)
Glucose, Bld: 115 mg/dL — ABNORMAL HIGH (ref 70–99)
Potassium: 3.7 mmol/L (ref 3.5–5.1)
SODIUM: 136 mmol/L (ref 135–145)

## 2018-04-23 LAB — PROTIME-INR
INR: 1.16
PROTHROMBIN TIME: 14.7 s (ref 11.4–15.2)

## 2018-04-23 LAB — CBC
HCT: 25 % — ABNORMAL LOW (ref 39.0–52.0)
HEMOGLOBIN: 7.8 g/dL — AB (ref 13.0–17.0)
MCH: 29.5 pg (ref 26.0–34.0)
MCHC: 31.2 g/dL (ref 30.0–36.0)
MCV: 94.7 fL (ref 80.0–100.0)
Platelets: 101 10*3/uL — ABNORMAL LOW (ref 150–400)
RBC: 2.64 MIL/uL — ABNORMAL LOW (ref 4.22–5.81)
RDW: 12.5 % (ref 11.5–15.5)
WBC: 14.5 10*3/uL — ABNORMAL HIGH (ref 4.0–10.5)
nRBC: 0 % (ref 0.0–0.2)

## 2018-04-23 SURGERY — LARYNGOSCOPY, DIRECT
Anesthesia: General

## 2018-04-23 MED ORDER — PROPOFOL 10 MG/ML IV BOLUS
INTRAVENOUS | Status: AC
  Filled 2018-04-23: qty 20

## 2018-04-23 MED ORDER — FENTANYL CITRATE (PF) 250 MCG/5ML IJ SOLN
INTRAMUSCULAR | Status: AC
  Filled 2018-04-23: qty 5

## 2018-04-23 MED ORDER — MIDAZOLAM HCL 2 MG/2ML IJ SOLN
INTRAMUSCULAR | Status: AC
  Filled 2018-04-23: qty 2

## 2018-04-23 NOTE — Consult Note (Signed)
[facility]  PULMONARY SERVICE  Date of Service: 04/23/2018  PULMONARY CRITICAL CARE CONSULT   Jimmy Martin  MBW:[id number]  DOB: 09-08-1955   DOA: 04/22/2018  Referring Physician: Merton Border, MD  HPI: Jimmy Martin is a 62 y.o. male seen for follow up of Acute on Chronic Respiratory Failure.  Patient has multiple medical problems including laryngeal cancer status post radiation pneumonia intubation [facility] and [facility] however on arrival patient developed severe stridor and was taken straight to the OR for urgent tracheostomy.  Patient apparently has a history of frequent COPD exacerbations and pneumonia.  He was seen previously for the same and was treated with antibiotics and started on BiPAP.  Patient failed BiPAP ended up having to be intubated.  And as already noted above patient developed acute stridor when [facility] and ended up having to be transferred to the emergency room for an emergent tracheostomy as well at some time but was admitted to the ICU.  Right now patient is on the ventilator he appears to be comfortable  Review of Systems:  ROS performed and is unremarkable other than noted above.  Past Medical History:  Diagnosis Date  . COPD (chronic obstructive pulmonary disease) (HCC)    Severe  . ESRD on peritoneal dialysis ([place])   . HTN (hypertension)   . 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 TRACHEOSTOMY TUBE PLACEMENT N/A 04/19/2018   Procedure: TRACHEOSTOMY;  Surgeon: Helayne Seminole, MD;  Location: [facility] OR;  Service: ENT;  Laterality: N/A;    Social History:    reports that he has been smoking cigarettes. He has been smoking about 1.00 pack per day. He does not have any smokeless tobacco history on file. His alcohol and drug histories are not on  file.  Family History: Non-Contributory to the present illness  Allergies  Allergen Reactions  . Acetaminophen Rash    "high heart rate"   . Bactrim [Sulfamethoxazole-Trimethoprim] Rash  . Ceftriaxone Itching and Rash    Association NOT definite; no urticaria or anaphylaxis; just "little red bumps" and itching  . Sulfa Antibiotics Rash    Medications: Reviewed on Rounds  Physical Exam:  Vitals: Temperature 98.7 pulse 105 respiratory rate 20 blood pressure 131/68 saturations 96%  Ventilator Settings patient is on the ventilator mode of ventilation assist control  . General: Comfortable at this time . Eyes: Grossly normal lids, irises & conjunctiva . ENT: grossly tongue is normal . Neck: no obvious mass . Cardiovascular: S1-S2 normal no gallop or rub . Respiratory: Coarse breath sounds with few rhonchi noted . Abdomen: Soft nondistended . Skin: no rash seen on limited exam . Musculoskeletal: not rigid . Psychiatric:unable to assess . Neurologic: no seizure no involuntary movements         Labs on Admission:  Basic Metabolic Panel: Recent Labs  Lab 04/19/18 1937  04/20/18 0527 04/20/18 1649 04/21/18 0535 04/21/18 0749 04/21/18 1150 04/21/18 1823 04/22/18 0338 04/23/18 0604  NA 136  --  135  --   --  138 134*  --  133* 136  K 5.1  --  6.2*  --   --  6.5* 3.4*  --  4.7 3.7  CL 95*  --  94*  --   --  98 96*  --  99 102  CO2  22  --  20*  --   --  17* 24  --   --  24  GLUCOSE 93  --  117*  --   --  117* 102*  --  142* 115*  BUN 160*  --  183*  --   --  232* 63*  --  109* 63*  CREATININE 8.58*   < > 9.35*  --   --  10.17* 3.74*  --  6.80* 4.13*  CALCIUM 8.7*  --  8.5*  --   --  7.9* 8.0*  --   --  7.6*  MG  --   --  3.1* 3.4* 3.6*  --  2.3 2.5*  --   --   PHOS  --   --  18.0* 18.6* 19.1* 19.2*  --  9.1*  --   --    < > = values in this interval not displayed.    Recent Labs  Lab 04/19/18 1750 04/20/18 0330 04/20/18 0609 04/21/18 2038 04/22/18 0332  PHART  7.351 7.215* 7.261* 7.428 7.352  PCO2ART 40.6 52.2* 49.2* 37.4 45.1  PO2ART 78.3* 342* 357.0* 155.0* 120.0*  HCO3 21.9 20.3 22.1 24.7 25.0  O2SAT 93.5 98.8 100.0 99.0 98.0    Liver Function Tests: Recent Labs  Lab 04/19/18 1937 04/21/18 0749  AST 34  --   ALT 81*  --   ALKPHOS 74  --   BILITOT 1.2  --   PROT 6.1*  --   ALBUMIN 2.6* 2.2*   No results for input(s): LIPASE, AMYLASE in the last 168 hours. No results for input(s): AMMONIA in the last 168 hours.  CBC: Recent Labs  Lab 04/19/18 1937 04/20/18 0306 04/20/18 0527 04/22/18 0338 04/23/18 0604  WBC 23.9* 22.4* 18.8*  --  14.5*  NEUTROABS 21.3*  --   --   --   --   HGB 12.6* 12.1* 11.5* 10.5* 7.8*  HCT 40.0 38.8* 37.0* 31.0* 25.0*  MCV 92.4 94.2 94.9  --  94.7  PLT 200 176 152  --  101*    Cardiac Enzymes: No results for input(s): CKTOTAL, CKMB, CKMBINDEX, TROPONINI in the last 168 hours.  BNP (last 3 results) No results for input(s): BNP in the last 8760 hours.  ProBNP (last 3 results) No results for input(s): PROBNP in the last 8760 hours.   Radiological Exams on Admission: Dg Abdomen Peg Tube Location  Result Date: 04/22/2018 CLINICAL DATA:  62 y/o M; PEG tube placement. 50 cc contrast administered via PEG tube. EXAM: ABDOMEN - 1 VIEW COMPARISON:  04/19/2018 abdomen radiograph. FINDINGS: Cutaneous gastrostomy tube balloon projects over gastric body. Contrast is pooling within the gastric fundus and extends throughout bowel to the rectum. No extravasation is identified on this single frontal view. Bilateral ureter stents noted. Normal bowel gas pattern. Bones are unremarkable. IMPRESSION: Contrast is pooling within the gastric fundus and extends throughout bowel to the rectum. No extravasation is identified on this single frontal view. Electronically Signed   By: Kristine Garbe M.D.   On: 04/22/2018 22:33   Dg Chest Port 1 View  Result Date: 04/20/2018 CLINICAL DATA:  Tracheostomy EXAM: PORTABLE  CHEST 1 VIEW COMPARISON:  04/19/2018 FINDINGS: Interval placement of a tracheostomy tube. Tip measures 6 cm above the carina. Right central venous catheter with tip over the cavoatrial junction region. No pneumothorax. Heart size and pulmonary vascularity are normal. Scattered calcified granulomas in the lungs. No airspace disease or consolidation. No blunting of costophrenic angles. Mediastinal contours  appear intact. Normal heart size and pulmonary vascularity. IMPRESSION: Appliances appear in satisfactory location. No evidence of active pulmonary disease. Electronically Signed   By: Lucienne Capers M.D.   On: 04/20/2018 02:32   Dg Chest Port 1 View  Result Date: 04/19/2018 CLINICAL DATA:  Dialysis catheter. EXAM: PORTABLE ABDOMEN - 1 VIEW; PORTABLE CHEST - 1 VIEW COMPARISON:  None. FINDINGS: Cardiomediastinal silhouette is normal. Mildly calcified aortic arch. No pleural effusions or focal consolidations. Tunneled dialysis catheter via RIGHT internal jugular venous approach with distal tip projecting in distal superior vena cava. Tiny suspected granulomas. Trachea projects midline and there is no pneumothorax. Soft tissue planes and included osseous structures are non-suspicious. Contrast injected into gastrostomy tube with contrast distended gastric antrum, no extraluminal contrast. Included bowel gas pattern is nondilated and nonobstructive. Bilateral nephroureteral stents. No mass effect or nephrolithiasis. Included osseous structures are non suspicious. IMPRESSION: 1. No acute cardiopulmonary process. Dialysis catheter tip projects in distal superior vena cava. 2. Intraluminal gastrostomy tube.  Bilateral nephroureteral stents. Electronically Signed   By: Elon Alas M.D.   On: 04/19/2018 19:01   Dg Abd Portable 1v  Result Date: 04/19/2018 CLINICAL DATA:  Dialysis catheter. EXAM: PORTABLE ABDOMEN - 1 VIEW; PORTABLE CHEST - 1 VIEW COMPARISON:  None. FINDINGS: Cardiomediastinal silhouette is  normal. Mildly calcified aortic arch. No pleural effusions or focal consolidations. Tunneled dialysis catheter via RIGHT internal jugular venous approach with distal tip projecting in distal superior vena cava. Tiny suspected granulomas. Trachea projects midline and there is no pneumothorax. Soft tissue planes and included osseous structures are non-suspicious. Contrast injected into gastrostomy tube with contrast distended gastric antrum, no extraluminal contrast. Included bowel gas pattern is nondilated and nonobstructive. Bilateral nephroureteral stents. No mass effect or nephrolithiasis. Included osseous structures are non suspicious. IMPRESSION: 1. No acute cardiopulmonary process. Dialysis catheter tip projects in distal superior vena cava. 2. Intraluminal gastrostomy tube.  Bilateral nephroureteral stents. Electronically Signed   By: Elon Alas M.D.   On: 04/19/2018 19:01    Assessment/Plan Principal Problem:   Acute on chronic respiratory failure with hypoxia (HCC) Active Problems:   Tracheostomy tube present (HCC)   Stage 5 chronic kidney disease on chronic dialysis (HCC)   Encephalopathy acute   Healthcare-associated pneumonia   1. Acute on chronic respiratory failure with hypoxia right now patient is on full vent support.  We are going to assess weaning parameters and then begin the weaning process.  The last chest x-ray that was done showed no acute pulmonary disease was will need to continue to monitor closely. 2. Tracheostomy was placed emergently and for the short-term anyhow we are going to continue with the tracheostomy in place. 3. Stage V chronic kidney disease end-stage on dialysis nephrology consultation 4. Acute encephalopathy treated patient remains grossly unchanged we will continue to monitor. 5. Healthcare associated pneumonia treated last chest x-ray shows no acute cardiopulmonary process.  I have personally seen and evaluated the patient, evaluated laboratory and  imaging results, formulated the assessment and plan and placed orders. The Patient requires high complexity decision making for assessment and support.  Case was discussed on Rounds with the Respiratory Therapy Staff Time Spent 30minutes  Allyne Gee, MD [facility] Pulmonary Critical Care Medicine Sleep Medicine

## 2018-04-24 ENCOUNTER — Encounter (HOSPITAL_COMMUNITY)
Admission: EM | Disposition: A | Discharge status: Home / Self Care | Payer: Self-pay | Source: Home / Self Care | Attending: Internal Medicine

## 2018-04-24 ENCOUNTER — Encounter (HOSPITAL_COMMUNITY): Payer: Self-pay | Admitting: Emergency Medicine

## 2018-04-24 ENCOUNTER — Inpatient Hospital Stay (HOSPITAL_COMMUNITY)
Admission: EM | Admit: 2018-04-24 | Discharge: 2018-04-26 | DRG: 205 | Disposition: A | Discharge status: Home / Self Care | Payer: Medicare Other | Attending: Internal Medicine | Admitting: Internal Medicine

## 2018-04-24 ENCOUNTER — Emergency Department (HOSPITAL_COMMUNITY): Payer: Medicare Other | Admitting: Certified Registered Nurse Anesthetist

## 2018-04-24 ENCOUNTER — Emergency Department (HOSPITAL_COMMUNITY): Payer: Medicare Other

## 2018-04-24 DIAGNOSIS — E46 Unspecified protein-calorie malnutrition: Secondary | ICD-10-CM | POA: Diagnosis present

## 2018-04-24 DIAGNOSIS — D649 Anemia, unspecified: Secondary | ICD-10-CM

## 2018-04-24 DIAGNOSIS — Z992 Dependence on renal dialysis: Secondary | ICD-10-CM

## 2018-04-24 DIAGNOSIS — Y95 Nosocomial condition: Secondary | ICD-10-CM | POA: Diagnosis present

## 2018-04-24 DIAGNOSIS — Z882 Allergy status to sulfonamides status: Secondary | ICD-10-CM

## 2018-04-24 DIAGNOSIS — J9501 Hemorrhage from tracheostomy stoma: Secondary | ICD-10-CM | POA: Diagnosis present

## 2018-04-24 DIAGNOSIS — Z9289 Personal history of other medical treatment: Secondary | ICD-10-CM

## 2018-04-24 DIAGNOSIS — D62 Acute posthemorrhagic anemia: Secondary | ICD-10-CM | POA: Diagnosis present

## 2018-04-24 DIAGNOSIS — D6489 Other specified anemias: Secondary | ICD-10-CM | POA: Diagnosis present

## 2018-04-24 DIAGNOSIS — R58 Hemorrhage, not elsewhere classified: Secondary | ICD-10-CM

## 2018-04-24 DIAGNOSIS — J440 Chronic obstructive pulmonary disease with acute lower respiratory infection: Secondary | ICD-10-CM | POA: Diagnosis present

## 2018-04-24 DIAGNOSIS — Z881 Allergy status to other antibiotic agents status: Secondary | ICD-10-CM | POA: Diagnosis not present

## 2018-04-24 DIAGNOSIS — G9349 Other encephalopathy: Secondary | ICD-10-CM | POA: Diagnosis present

## 2018-04-24 DIAGNOSIS — J9621 Acute and chronic respiratory failure with hypoxia: Secondary | ICD-10-CM | POA: Diagnosis present

## 2018-04-24 DIAGNOSIS — R578 Other shock: Secondary | ICD-10-CM | POA: Diagnosis present

## 2018-04-24 DIAGNOSIS — Z79891 Long term (current) use of opiate analgesic: Secondary | ICD-10-CM

## 2018-04-24 DIAGNOSIS — Z888 Allergy status to other drugs, medicaments and biological substances status: Secondary | ICD-10-CM | POA: Diagnosis not present

## 2018-04-24 DIAGNOSIS — J9600 Acute respiratory failure, unspecified whether with hypoxia or hypercapnia: Secondary | ICD-10-CM

## 2018-04-24 DIAGNOSIS — Z6824 Body mass index (BMI) 24.0-24.9, adult: Secondary | ICD-10-CM

## 2018-04-24 DIAGNOSIS — Z79899 Other long term (current) drug therapy: Secondary | ICD-10-CM | POA: Diagnosis not present

## 2018-04-24 DIAGNOSIS — N186 End stage renal disease: Secondary | ICD-10-CM

## 2018-04-24 DIAGNOSIS — Z923 Personal history of irradiation: Secondary | ICD-10-CM

## 2018-04-24 DIAGNOSIS — C329 Malignant neoplasm of larynx, unspecified: Secondary | ICD-10-CM

## 2018-04-24 DIAGNOSIS — I959 Hypotension, unspecified: Secondary | ICD-10-CM | POA: Diagnosis present

## 2018-04-24 DIAGNOSIS — J189 Pneumonia, unspecified organism: Secondary | ICD-10-CM | POA: Diagnosis present

## 2018-04-24 DIAGNOSIS — F419 Anxiety disorder, unspecified: Secondary | ICD-10-CM | POA: Diagnosis present

## 2018-04-24 DIAGNOSIS — E1122 Type 2 diabetes mellitus with diabetic chronic kidney disease: Secondary | ICD-10-CM | POA: Diagnosis present

## 2018-04-24 DIAGNOSIS — G9341 Metabolic encephalopathy: Secondary | ICD-10-CM | POA: Diagnosis not present

## 2018-04-24 DIAGNOSIS — E872 Acidosis: Secondary | ICD-10-CM | POA: Diagnosis present

## 2018-04-24 DIAGNOSIS — Y833 Surgical operation with formation of external stoma as the cause of abnormal reaction of the patient, or of later complication, without mention of misadventure at the time of the procedure: Secondary | ICD-10-CM | POA: Diagnosis present

## 2018-04-24 DIAGNOSIS — R739 Hyperglycemia, unspecified: Secondary | ICD-10-CM

## 2018-04-24 DIAGNOSIS — L7682 Other postprocedural complications of skin and subcutaneous tissue: Secondary | ICD-10-CM

## 2018-04-24 DIAGNOSIS — I10 Essential (primary) hypertension: Secondary | ICD-10-CM

## 2018-04-24 DIAGNOSIS — D631 Anemia in chronic kidney disease: Secondary | ICD-10-CM | POA: Diagnosis present

## 2018-04-24 DIAGNOSIS — J9601 Acute respiratory failure with hypoxia: Secondary | ICD-10-CM

## 2018-04-24 DIAGNOSIS — Z931 Gastrostomy status: Secondary | ICD-10-CM

## 2018-04-24 DIAGNOSIS — Z8521 Personal history of malignant neoplasm of larynx: Secondary | ICD-10-CM

## 2018-04-24 DIAGNOSIS — Z930 Tracheostomy status: Secondary | ICD-10-CM | POA: Diagnosis not present

## 2018-04-24 DIAGNOSIS — G8929 Other chronic pain: Secondary | ICD-10-CM | POA: Diagnosis present

## 2018-04-24 DIAGNOSIS — F17210 Nicotine dependence, cigarettes, uncomplicated: Secondary | ICD-10-CM | POA: Diagnosis present

## 2018-04-24 HISTORY — PX: TRACHEOSTOMY TUBE PLACEMENT: SHX814

## 2018-04-24 LAB — PREPARE RBC (CROSSMATCH)

## 2018-04-24 LAB — CBC
HEMATOCRIT: 24.9 % — AB (ref 39.0–52.0)
HEMOGLOBIN: 7.7 g/dL — AB (ref 13.0–17.0)
MCH: 29.3 pg (ref 26.0–34.0)
MCHC: 30.9 g/dL (ref 30.0–36.0)
MCV: 94.7 fL (ref 80.0–100.0)
Platelets: 105 10*3/uL — ABNORMAL LOW (ref 150–400)
RBC: 2.63 MIL/uL — AB (ref 4.22–5.81)
RDW: 12.6 % (ref 11.5–15.5)
WBC: 13.3 10*3/uL — ABNORMAL HIGH (ref 4.0–10.5)
nRBC: 0 % (ref 0.0–0.2)

## 2018-04-24 LAB — POCT I-STAT EG7
Acid-base deficit: 7 mmol/L — ABNORMAL HIGH (ref 0.0–2.0)
BICARBONATE: 22.4 mmol/L (ref 20.0–28.0)
Calcium, Ion: 1.05 mmol/L — ABNORMAL LOW (ref 1.15–1.40)
HCT: 28 % — ABNORMAL LOW (ref 39.0–52.0)
Hemoglobin: 9.5 g/dL — ABNORMAL LOW (ref 13.0–17.0)
O2 SAT: 94 %
PH VEN: 7.12 — AB (ref 7.250–7.430)
Patient temperature: 37.5
Potassium: 4.7 mmol/L (ref 3.5–5.1)
SODIUM: 136 mmol/L (ref 135–145)
TCO2: 24 mmol/L (ref 22–32)
pCO2, Ven: 69.5 mmHg — ABNORMAL HIGH (ref 44.0–60.0)
pO2, Ven: 97 mmHg — ABNORMAL HIGH (ref 32.0–45.0)

## 2018-04-24 LAB — CBC WITH DIFFERENTIAL/PLATELET
Abs Immature Granulocytes: 0.08 10*3/uL — ABNORMAL HIGH (ref 0.00–0.07)
Basophils Absolute: 0 10*3/uL (ref 0.0–0.1)
Basophils Relative: 0 %
Eosinophils Absolute: 0.1 10*3/uL (ref 0.0–0.5)
Eosinophils Relative: 1 %
HEMATOCRIT: 25.6 % — AB (ref 39.0–52.0)
HEMOGLOBIN: 7.5 g/dL — AB (ref 13.0–17.0)
Immature Granulocytes: 1 %
LYMPHS PCT: 4 %
Lymphs Abs: 0.5 10*3/uL — ABNORMAL LOW (ref 0.7–4.0)
MCH: 28.8 pg (ref 26.0–34.0)
MCHC: 29.3 g/dL — AB (ref 30.0–36.0)
MCV: 98.5 fL (ref 80.0–100.0)
MONO ABS: 0.8 10*3/uL (ref 0.1–1.0)
Monocytes Relative: 6 %
Neutro Abs: 12.1 10*3/uL — ABNORMAL HIGH (ref 1.7–7.7)
Neutrophils Relative %: 88 %
Platelets: 112 10*3/uL — ABNORMAL LOW (ref 150–400)
RBC: 2.6 MIL/uL — AB (ref 4.22–5.81)
RDW: 12.7 % (ref 11.5–15.5)
WBC: 13.5 10*3/uL — AB (ref 4.0–10.5)
nRBC: 0 % (ref 0.0–0.2)

## 2018-04-24 LAB — RENAL FUNCTION PANEL
ANION GAP: 13 (ref 5–15)
Albumin: 1.7 g/dL — ABNORMAL LOW (ref 3.5–5.0)
BUN: 98 mg/dL — ABNORMAL HIGH (ref 6–20)
CO2: 20 mmol/L — AB (ref 22–32)
Calcium: 7.5 mg/dL — ABNORMAL LOW (ref 8.9–10.3)
Chloride: 102 mmol/L (ref 98–111)
Creatinine, Ser: 6.28 mg/dL — ABNORMAL HIGH (ref 0.61–1.24)
GFR calc non Af Amer: 9 mL/min — ABNORMAL LOW (ref >60)
GFR, EST AFRICAN AMERICAN: 10 mL/min — AB (ref >60)
Glucose, Bld: 121 mg/dL — ABNORMAL HIGH (ref 70–99)
PHOSPHORUS: 8.3 mg/dL — AB (ref 2.5–4.6)
POTASSIUM: 4.2 mmol/L (ref 3.5–5.1)
SODIUM: 135 mmol/L (ref 135–145)

## 2018-04-24 LAB — I-STAT CHEM 8, ED
BUN: 111 mg/dL — AB (ref 6–20)
CALCIUM ION: 1.03 mmol/L — AB (ref 1.15–1.40)
CHLORIDE: 99 mmol/L (ref 98–111)
Creatinine, Ser: 6.9 mg/dL — ABNORMAL HIGH (ref 0.61–1.24)
GLUCOSE: 96 mg/dL (ref 70–99)
HCT: 20 % — ABNORMAL LOW (ref 39.0–52.0)
Hemoglobin: 6.8 g/dL — CL (ref 13.0–17.0)
Potassium: 4.1 mmol/L (ref 3.5–5.1)
SODIUM: 134 mmol/L — AB (ref 135–145)
TCO2: 26 mmol/L (ref 22–32)

## 2018-04-24 LAB — HEMOGLOBIN AND HEMATOCRIT, BLOOD
HCT: 30.5 % — ABNORMAL LOW (ref 39.0–52.0)
HCT: 26.8 % — ABNORMAL LOW (ref 39.0–52.0)
HEMOGLOBIN: 8.3 g/dL — AB (ref 13.0–17.0)
Hemoglobin: 9.8 g/dL — ABNORMAL LOW (ref 13.0–17.0)

## 2018-04-24 LAB — CK TOTAL AND CKMB (NOT AT ARMC)
CK, MB: 6.1 ng/mL — AB (ref 0.5–5.0)
RELATIVE INDEX: 4.5 — AB (ref 0.0–2.5)
Total CK: 137 U/L (ref 49–397)

## 2018-04-24 LAB — APTT: APTT: 28 s (ref 24–36)

## 2018-04-24 LAB — TROPONIN I
TROPONIN I: 0.36 ng/mL — AB (ref <0.03)
Troponin I: 0.3 ng/mL (ref <0.03)

## 2018-04-24 LAB — ABO/RH: ABO/RH(D): B POS

## 2018-04-24 LAB — PROTIME-INR
INR: 1.36
Prothrombin Time: 16.6 seconds — ABNORMAL HIGH (ref 11.4–15.2)

## 2018-04-24 SURGERY — CREATION, TRACHEOSTOMY
Anesthesia: General

## 2018-04-24 MED ORDER — ETOMIDATE 2 MG/ML IV SOLN
INTRAVENOUS | Status: AC | PRN
  Administered 2018-04-24: 20 mg via INTRAVENOUS

## 2018-04-24 MED ORDER — FENTANYL 2500MCG IN NS 250ML (10MCG/ML) PREMIX INFUSION: 25.0000 ug/h | INTRAVENOUS | Status: DC

## 2018-04-24 MED ORDER — ROCURONIUM BROMIDE 50 MG/5ML IV SOLN
INTRAVENOUS | Status: AC | PRN
  Administered 2018-04-24: 50 mg via INTRAVENOUS

## 2018-04-24 MED ORDER — FENTANYL CITRATE (PF) 250 MCG/5ML IJ SOLN
INTRAMUSCULAR | Status: AC
  Filled 2018-04-24: qty 5

## 2018-04-24 MED ORDER — ROCURONIUM BROMIDE 10 MG/ML (PF) SYRINGE
PREFILLED_SYRINGE | INTRAVENOUS | Status: DC | PRN
  Administered 2018-04-24: 50 mg via INTRAVENOUS

## 2018-04-24 MED ORDER — ONDANSETRON HCL 4 MG/2ML IJ SOLN
INTRAMUSCULAR | Status: DC | PRN
  Administered 2018-04-24: 4 mg via INTRAVENOUS

## 2018-04-24 MED ORDER — FENTANYL CITRATE (PF) 100 MCG/2ML IJ SOLN
25.0000 ug | INTRAMUSCULAR | Status: DC | PRN
  Administered 2018-04-24: 50 ug via INTRAVENOUS
  Filled 2018-04-24: qty 2

## 2018-04-24 MED ORDER — CEFAZOLIN SODIUM-DEXTROSE 2-3 GM-%(50ML) IV SOLR
INTRAVENOUS | Status: DC | PRN
  Administered 2018-04-24: 2 g via INTRAVENOUS

## 2018-04-24 MED ORDER — HEMOSTATIC AGENTS (NO CHARGE) OPTIME
TOPICAL | Status: DC | PRN
  Administered 2018-04-24: 1 via TOPICAL

## 2018-04-24 MED ORDER — HEPARIN 1000 UNIT/ML FOR PERITONEAL DIALYSIS
4000.0000 [IU] | INTRAMUSCULAR | Status: DC | PRN
  Administered 2018-04-24: 3800 [IU] via INTRAVENOUS
  Filled 2018-04-24: qty 3.8
  Filled 2018-04-24: qty 4

## 2018-04-24 MED ORDER — MIDAZOLAM HCL 5 MG/5ML IJ SOLN
INTRAMUSCULAR | Status: DC | PRN
  Administered 2018-04-24: 2 mg via INTRAVENOUS

## 2018-04-24 MED ORDER — FENTANYL CITRATE (PF) 100 MCG/2ML IJ SOLN: 50.0000 ug | Freq: Once | INTRAMUSCULAR | Status: DC

## 2018-04-24 MED ORDER — ALBUMIN HUMAN 5 % IV SOLN
INTRAVENOUS | Status: DC | PRN
  Administered 2018-04-24: 16:00:00 via INTRAVENOUS

## 2018-04-24 MED ORDER — DEXTROSE-NACL 5-0.9 % IV SOLN
INTRAVENOUS | Status: DC
  Administered 2018-04-24 – 2018-04-25 (×2): via INTRAVENOUS

## 2018-04-24 MED ORDER — MIDAZOLAM HCL 2 MG/2ML IJ SOLN
INTRAMUSCULAR | Status: AC
  Filled 2018-04-24: qty 2

## 2018-04-24 MED ORDER — LIDOCAINE-EPINEPHRINE 1 %-1:100000 IJ SOLN
INTRAMUSCULAR | Status: DC | PRN
  Administered 2018-04-24: 30 mL

## 2018-04-24 MED ORDER — SODIUM CHLORIDE 0.9 % IR SOLN
Status: DC | PRN
  Administered 2018-04-24: 1000 mL

## 2018-04-24 MED ORDER — SODIUM CHLORIDE 0.9 % IV SOLN
10.0000 mL/h | Freq: Once | INTRAVENOUS | Status: AC
  Administered 2018-04-24: 15:00:00 via INTRAVENOUS

## 2018-04-24 MED ORDER — FENTANYL CITRATE (PF) 100 MCG/2ML IJ SOLN
INTRAMUSCULAR | Status: AC
  Filled 2018-04-24: qty 2

## 2018-04-24 MED ORDER — FENTANYL CITRATE (PF) 100 MCG/2ML IJ SOLN
25.0000 ug | Freq: Once | INTRAMUSCULAR | Status: AC | PRN
  Administered 2018-04-24: 25 ug via INTRAVENOUS
  Filled 2018-04-24: qty 2

## 2018-04-24 MED ORDER — LEVALBUTEROL HCL 0.63 MG/3ML IN NEBU: 0.6300 mg | INHALATION_SOLUTION | Freq: Four times a day (QID) | RESPIRATORY_TRACT | Status: DC | PRN

## 2018-04-24 MED ORDER — DEXMEDETOMIDINE HCL IN NACL 200 MCG/50ML IV SOLN
0.4000 ug/kg/h | INTRAVENOUS | Status: DC
  Administered 2018-04-24 – 2018-04-25 (×2): 0.5 ug/kg/h via INTRAVENOUS
  Administered 2018-04-25: 0.3 ug/kg/h via INTRAVENOUS
  Administered 2018-04-26: 0.301 ug/kg/h via INTRAVENOUS
  Administered 2018-04-26: 0.5 ug/kg/h via INTRAVENOUS
  Filled 2018-04-24 (×9): qty 50

## 2018-04-24 MED ORDER — FENTANYL BOLUS VIA INFUSION
50.0000 ug | INTRAVENOUS | Status: DC | PRN
  Filled 2018-04-24: qty 50

## 2018-04-24 MED ORDER — SODIUM CHLORIDE 0.9 % IV SOLN
INTRAVENOUS | Status: DC | PRN
  Administered 2018-04-24: 15:00:00 via INTRAVENOUS

## 2018-04-24 MED ORDER — SODIUM CHLORIDE 0.9 % IV SOLN
INTRAVENOUS | Status: DC | PRN
  Administered 2018-04-24: 20 ug/min via INTRAVENOUS

## 2018-04-24 MED ORDER — MIDAZOLAM HCL 2 MG/2ML IJ SOLN
INTRAMUSCULAR | Status: AC | PRN
  Administered 2018-04-24: 2 mg via INTRAVENOUS

## 2018-04-24 MED ORDER — FENTANYL CITRATE (PF) 100 MCG/2ML IJ SOLN
INTRAMUSCULAR | Status: AC | PRN
  Administered 2018-04-24: 100 ug via INTRAVENOUS

## 2018-04-24 SURGICAL SUPPLY — 41 items
BLADE CLIPPER SURG (BLADE) IMPLANT
BLADE SURG 15 STRL LF DISP TIS (BLADE) IMPLANT
BLADE SURG 15 STRL SS (BLADE)
CANISTER SUCT 3000ML PPV (MISCELLANEOUS) ×2 IMPLANT
CLEANER TIP ELECTROSURG 2X2 (MISCELLANEOUS) ×2 IMPLANT
COAGULATOR SUCT 8FR VV (MISCELLANEOUS) ×2 IMPLANT
COVER SURGICAL LIGHT HANDLE (MISCELLANEOUS) ×4 IMPLANT
COVER WAND RF STERILE (DRAPES) ×2 IMPLANT
DECANTER SPIKE VIAL GLASS SM (MISCELLANEOUS) ×2 IMPLANT
DRAPE HALF SHEET 40X57 (DRAPES) IMPLANT
ELECT COATED BLADE 2.86 ST (ELECTRODE) ×2 IMPLANT
ELECT REM PT RETURN 9FT ADLT (ELECTROSURGICAL) ×2
ELECTRODE REM PT RTRN 9FT ADLT (ELECTROSURGICAL) ×1 IMPLANT
GAUZE 4X4 16PLY RFD (DISPOSABLE) ×2 IMPLANT
GAUZE XEROFORM 5X9 LF (GAUZE/BANDAGES/DRESSINGS) IMPLANT
GLOVE BIOGEL M 7.0 STRL (GLOVE) ×4 IMPLANT
GOWN STRL REUS W/ TWL LRG LVL3 (GOWN DISPOSABLE) ×1 IMPLANT
GOWN STRL REUS W/TWL LRG LVL3 (GOWN DISPOSABLE) ×1
HEMOSTAT SURGICEL 2X14 (HEMOSTASIS) ×2 IMPLANT
HOLDER TRACH TUBE VELCRO 19.5 (MISCELLANEOUS) ×2 IMPLANT
KIT BASIN OR (CUSTOM PROCEDURE TRAY) ×2 IMPLANT
KIT SUCTION CATH 14FR (SUCTIONS) ×2 IMPLANT
KIT TURNOVER KIT B (KITS) ×2 IMPLANT
NEEDLE HYPO 25GX1X1/2 BEV (NEEDLE) ×2 IMPLANT
NS IRRIG 1000ML POUR BTL (IV SOLUTION) ×2 IMPLANT
PACK EENT II TURBAN DRAPE (CUSTOM PROCEDURE TRAY) ×2 IMPLANT
PAD ARMBOARD 7.5X6 YLW CONV (MISCELLANEOUS) ×4 IMPLANT
PENCIL BUTTON HOLSTER BLD 10FT (ELECTRODE) ×2 IMPLANT
SPONGE DRAIN TRACH 4X4 STRL 2S (GAUZE/BANDAGES/DRESSINGS) ×2 IMPLANT
SPONGE INTESTINAL PEANUT (DISPOSABLE) ×2 IMPLANT
SPONGE LAP 18X18 X RAY DECT (DISPOSABLE) ×2 IMPLANT
SUT CHROMIC 2 0 SH (SUTURE) ×2 IMPLANT
SUT ETHILON 2 0 FS 18 (SUTURE) ×2 IMPLANT
SUT SILK 2 0 PERMA HAND 18 BK (SUTURE) ×2 IMPLANT
SUT SILK 3 0 REEL (SUTURE) ×2 IMPLANT
SYR 20CC LL (SYRINGE) IMPLANT
SYR BULB IRRIGATION 50ML (SYRINGE) IMPLANT
SYR CONTROL 10ML LL (SYRINGE) IMPLANT
TUBE CONNECTING 12X1/4 (SUCTIONS) IMPLANT
TUBE TRACH SHILEY  6 DIST  CUF (TUBING) ×2 IMPLANT
WATER STERILE IRR 1000ML POUR (IV SOLUTION) ×2 IMPLANT

## 2018-04-24 NOTE — Transfer of Care (Signed)
Immediate Anesthesia Transfer of Care Note  Patient: Jimmy Martin  Procedure(s) Performed: control of tracheostomy hemorrhage (N/A )  Patient Location: ICU  Anesthesia Type:General  Level of Consciousness: awake  Airway & Oxygen Therapy: Patient placed on Ventilator (see vital sign flow sheet for setting) per trach  Post-op Assessment: Report given to RN and Post -op Vital signs reviewed and stable  Post vital signs: Reviewed and stable  Last Vitals:  Vitals Value Taken Time  BP 159/86 04/24/2018  4:51 PM  Temp    Pulse 129 04/24/2018  4:58 PM  Resp 22 04/24/2018  4:58 PM  SpO2 100 % 04/24/2018  4:58 PM  Vitals shown include unvalidated device data.  Last Pain:  Vitals:   04/24/18 1359  TempSrc: Axillary         Complications: No apparent anesthesia complications

## 2018-04-24 NOTE — Progress Notes (Addendum)
Patient's current BP is 77/42 with a MAP of 55. Patient is alert and oriented.  Precedex drip was titrated down to 0.4. [place] notified. Still waiting for orders.

## 2018-04-24 NOTE — Progress Notes (Signed)
eLink Physician-Brief Progress Note Patient Name: Jimmy Martin DOB: 06-30-1955 MRN: [id number]   Date of Service  04/24/2018  HPI/Events of Note  Notified of patient complaining of pain all over as well as pain from trach site.  BP relatively low but improved after titrating down Precedex. Patient seen not in distress hooked to the vent.  eICU Interventions  Ordered a one time dose of Fentanyl 25 mg IV and monitor response.     Intervention Category Intermediate Interventions: Pain - evaluation and management  Judd Lien 04/24/2018, 8:52 PM

## 2018-04-24 NOTE — Progress Notes (Signed)
Received pt from [facility].  Pt placed on vent settings reported by [facility].  MD at bedside to eval bleeding trach site.

## 2018-04-24 NOTE — Anesthesia Postprocedure Evaluation (Signed)
Anesthesia Post Note  Patient: Jimmy Martin  Procedure(s) Performed: control of tracheostomy hemorrhage (N/A )     Patient location during evaluation: SICU Anesthesia Type: General Level of consciousness: sedated Pain management: pain level controlled Vital Signs Assessment: post-procedure vital signs reviewed and stable Respiratory status: patient remains intubated per anesthesia plan Cardiovascular status: stable Postop Assessment: no apparent nausea or vomiting Anesthetic complications: no    Last Vitals:  Vitals:   04/24/18 1455 04/24/18 1631  BP: 131/82   Pulse: (!) 130 (!) 122  Resp: (!) 25 (!) 22  Temp:    SpO2: 100% 100%    Last Pain:  Vitals:   04/24/18 1359  TempSrc: Axillary                 Galilee Pierron,W. EDMOND

## 2018-04-24 NOTE — OR Nursing (Signed)
No operative consent due to emergency situation and no family available.

## 2018-04-24 NOTE — Progress Notes (Signed)
Pt was emergently transported to OR via vent w/ no apparent complications during transport.   Report given to CRNA

## 2018-04-24 NOTE — ED Provider Notes (Addendum)
[place] EMERGENCY DEPARTMENT Provider Note   CSN: [id number] Arrival date & time: 04/24/18  1346     History   Chief Complaint Chief Complaint  Patient presents with  . Coagulation Disorder    HPI Jimmy Martin is a 62 y.o. male.  HPI   Patient noted to began bleeding after coughing today.  He is being managed in the [facility], here at [facility].  He seems to be bleeding from around his tracheostomy site.  He is unable to speak.  Patient was recently admitted to the [facility], for management of chronic respiratory difficulty.  4 days ago he had an urgent tracheostomy placed.  He has an erosive laryngeal cancer.  Level 5 caveat-intubated  Past Medical History:  Diagnosis Date  . COPD (chronic obstructive pulmonary disease) (HCC)    Severe  . ESRD on peritoneal dialysis ([place])   . HTN (hypertension)   . 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 Acute on chronic respiratory failure with hypoxia ([place]) 04/23/2018  . Healthcare-associated pneumonia 04/23/2018  . Stage 5 chronic kidney disease on chronic dialysis ([place])   . Encephalopathy acute   . Chronic bronchitis ([place])   . Respiratory failure ([place]) 04/20/2018  . Acute respiratory failure with hypoxia and hypercapnia ([place]) 04/20/2018  . Pressure injury of skin 04/20/2018  . Tracheostomy tube present [facility])     Past Surgical History:  Procedure Laterality Date  . TRACHEOSTOMY TUBE PLACEMENT N/A 04/19/2018   Procedure: TRACHEOSTOMY;  Surgeon: Helayne Seminole, MD;  Location: [facility] OR;  Service: ENT;  Laterality: N/A;        Home Medications    Prior to Admission medications   Medication Sig Start Date End Date Taking? Authorizing Provider  Amino Acids-Protein Hydrolys (FEEDING SUPPLEMENT, PRO-STAT SUGAR FREE 64,) LIQD Place 60 mLs into feeding tube 2 (two) times daily between meals. 04/22/18   Donita Brooks,  NP  arformoterol (BROVANA) 15 MCG/2ML NEBU Take 2 mLs (15 mcg total) by nebulization 2 (two) times daily. 04/22/18   Donita Brooks, NP  budesonide (PULMICORT) 0.5 MG/2ML nebulizer solution Take 2 mLs (0.5 mg total) by nebulization 2 (two) times daily. 04/22/18   Donita Brooks, NP  calcitRIOL (ROCALTROL) 0.25 MCG capsule Take 0.25 mcg by mouth once a week.    [provider]  calcium acetate (PHOSLO) 667 MG capsule Take 667 mg by mouth 3 (three) times daily with meals.     [provider]  ceFEPIme 1 g in sodium chloride 0.9 % 100 mL Inject 1 g into the vein daily. 04/22/18   Donita Brooks, NP  Chlorhexidine Gluconate Cloth 2 % PADS Apply 6 each topically daily at 6 (six) AM. 04/23/18   Donita Brooks, NP  chlorhexidine gluconate, MEDLINE KIT, (PERIDEX) 0.12 % solution 15 mLs by Mouth Rinse route 2 (two) times daily. 04/22/18   Donita Brooks, NP  fentaNYL (SUBLIMAZE) 100 MCG/2ML injection Inject 1 mL (50 mcg total) into the vein every 2 (two) hours as needed (to maintain RASS goal.). 04/22/18   Donita Brooks, NP  heparin 1000 unit/mL SOLN injection 3 mLs (3,000 Units total) by Dialysis route as needed (in dialysis). 04/22/18   Donita Brooks, NP  heparin 5000 UNIT/ML injection Inject 1 mL (5,000 Units total) into the skin every 8 (eight) hours. 04/22/18   Noe Gens  L, NP  ipratropium-albuterol (DUONEB) 0.5-2.5 (3) MG/3ML SOLN Take 3 mLs by nebulization every 6 (six) hours. 04/22/18   Donita Brooks, NP  mouth rinse LIQD solution 15 mLs by Mouth Rinse route every 2 (two) hours. 04/22/18   Donita Brooks, NP  nicotine (NICODERM CQ - DOSED IN MG/24 HOURS) 21 mg/24hr patch Place 1 patch (21 mg total) onto the skin daily. 04/23/18   Donita Brooks, NP  Nutritional Supplements (FEEDING SUPPLEMENT, NEPRO CARB STEADY,) LIQD Take 1,000 mLs by mouth daily. 04/22/18   Donita Brooks, NP  sevelamer carbonate (RENVELA) 0.8 g PACK packet Take 0.8 g by mouth 3 (three) times daily  with meals.    [provider]  sodium chloride flush (NS) 0.9 % SOLN Inject 3 mLs into the vein every 12 (twelve) hours. 04/22/18   Donita Brooks, NP  sodium chloride flush (NS) 0.9 % SOLN Inject 3 mLs into the vein as needed. 04/22/18   Donita Brooks, NP    Family History History reviewed. No pertinent family history.  Social History Social History   Tobacco Use  . Smoking status: Current Every Day Smoker    Packs/day: 1.00    Types: Cigarettes  . Smokeless tobacco: Never Used  Substance Use Topics  . Alcohol use: Not on file  . Drug use: Not on file     Allergies   Acetaminophen; Tea; Bactrim [sulfamethoxazole-trimethoprim]; Ceftriaxone; and Sulfa antibiotics   Review of Systems Review of Systems   Physical Exam Updated Vital Signs Pulse (!) 111   Temp 98 F (36.7 C) (Axillary)   Resp (!) 26   Ht _0  (1.778 m)   Wt 77 kg   SpO2 97%   BMI 24.36 kg/m   Physical Exam  Constitutional: He appears well-developed. He appears distressed (Uncomfortable).  Chronically ill-appearing  HENT:  Head: Normocephalic and atraumatic.  Right Ear: External ear normal.  Left Ear: External ear normal.  Eyes: Pupils are equal, round, and reactive to light. Conjunctivae and EOM are normal.  Neck: Normal range of motion and phonation normal. Neck supple.  Cardiovascular: Normal rate.  Tachycardic  Pulmonary/Chest: Breath sounds normal. No respiratory distress. He exhibits no bony tenderness.  On ventilator.  Initially on arrival, tracheostomy site and cannulas were covered with bloody towels.  These were taken down gradually, to reveal moderate amount of active bleeding and blood clots adherent to tracheostomy cannulas.  Using suction, gauze, and saline, the clots were removed.  Patient appears to have active venous bleeding from beneath the tracheostomy outer tube collar.  There is a surgical wound beneath this, but I am unable to visualize the edges are received a  site which is bleeding.  The intubating cannula was suctioned by respiratory therapy without significant amount of blood in the trachea.  Musculoskeletal: Normal range of motion.  Neurological: He is alert. No cranial nerve deficit. He exhibits normal muscle tone. Coordination normal.  Skin: Skin is warm, dry and intact.  Psychiatric: He has a normal mood and affect.  Nursing note and vitals reviewed.    ED Treatments / Results  Labs (all labs ordered are listed, but only abnormal results are displayed) Labs Reviewed  I-STAT CHEM 8, ED - Abnormal; Notable for the following components:      Result Value   Sodium 134 (*)    BUN 111 (*)    Creatinine, Ser 6.90 (*)    Calcium, Ion 1.03 (*)    Hemoglobin 6.8 (*)  HCT 20.0 (*)    All other components within normal limits  CBC WITH DIFFERENTIAL/PLATELET  TYPE AND SCREEN  PREPARE RBC (CROSSMATCH)  ABO/RH    EKG None  Radiology Dg Abdomen Peg Tube Location  Result Date: 04/22/2018 CLINICAL DATA:  62 y/o M; PEG tube placement. 50 cc contrast administered via PEG tube. EXAM: ABDOMEN - 1 VIEW COMPARISON:  04/19/2018 abdomen radiograph. FINDINGS: Cutaneous gastrostomy tube balloon projects over gastric body. Contrast is pooling within the gastric fundus and extends throughout bowel to the rectum. No extravasation is identified on this single frontal view. Bilateral ureter stents noted. Normal bowel gas pattern. Bones are unremarkable. IMPRESSION: Contrast is pooling within the gastric fundus and extends throughout bowel to the rectum. No extravasation is identified on this single frontal view. Electronically Signed   By: Kristine Garbe M.D.   On: 04/22/2018 22:33   Dg Chest Port 1 View  Result Date: 04/24/2018 CLINICAL DATA:  Bleeding from tracheostomy EXAM: PORTABLE CHEST 1 VIEW COMPARISON:  04/20/2018 FINDINGS: Dialysis catheter on the right with tip at the upper cavoatrial junction. Tracheostomy tube in place. Extensive  artifact from overlying bedding or bandages. Chronic hyperinflation. Normal heart size. IMPRESSION: 1. Artifact from bandages or bedding. There is no definite lung opacity. 2. Tracheostomy tube is well seated. 3. COPD. Electronically Signed   By: Monte Fantasia M.D.   On: 04/24/2018 14:26    Procedures .Critical Care Performed by: Daleen Bo, MD Authorized by: Daleen Bo, MD   Critical care provider statement:    Critical care time (minutes):  35   Critical care start time:  04/24/2018 1:55 PM   Critical care end time:  04/24/2018 2:30 PM   Critical care time was exclusive of:  Separately billable procedures and treating other patients   Critical care was necessary to treat or prevent imminent or life-threatening deterioration of the following conditions:  Circulatory failure   Critical care was time spent personally by me on the following activities:  Blood draw for specimens, development of treatment plan with patient or surrogate, discussions with consultants, evaluation of patient's response to treatment, examination of patient, obtaining history from patient or surrogate, ordering and performing treatments and interventions, ordering and review of laboratory studies, pulse oximetry, re-evaluation of patient's condition, review of old charts and ordering and review of radiographic studies   (including critical care time)  Medications Ordered in ED Medications  0.9 %  sodium chloride infusion (has no administration in time range)     Initial Impression / Assessment and Plan / ED Course  I have reviewed the triage vital signs and the nursing notes.  Pertinent labs & imaging results that were available during my care of the patient were reviewed by me and considered in my medical decision making (see chart for details).  Clinical Course as of Apr 24 1542  Wed Apr 24, 2018  1441 Continues to bleed, bleeding located beneath the tracheostomy tube.  On suctioning there is no  blood in the trachea.  Venous pulsations seen beneath trach collar.  Unable to localize a source for direct pressure, or placement of thrombin.  I have discussed the case again with ENT, who is currently talking to the intensivist who plans on attempting to intervene by removing the tracheostomy tube, and intubating through the stoma, in an attempt to be able to see the source of bleeding and stop it.   [EW]  1443 Emergent blood and crossmatch blood have been ordered.   [EW]  1542 Normal except sodium low, BUN high, creatinine high, calcium low, hemoglobin low  I-stat Chem 8, ED(!!) [EW]  1542 Elevated white blood cell count, low hemoglobin  CBC with Differential(!) [EW]  1542 No infiltrate or CHF, images reviewed by me  DG Chest Port 1 View [EW]    Clinical Course User Index [EW] Daleen Bo, MD     Patient Vitals for the past 24 hrs:  BP Temp Temp src Pulse Resp SpO2 Height Weight  04/24/18 1455 131/82 - - (!) 130 (!) 25 100 % - -  04/24/18 1400 - - - - - - _0  (1.778 m) 77 kg  04/24/18 1359 - 98 F (36.7 C) Axillary (!) 111 (!) 26 97 % - -  04/24/18 1350 - - - (!) 112 (!) 28 97 % - -    I had to telephone conversations with ENT, Dr. Wilburn Cornelia.  First 1 was when the patient first arrived before I visualized the tracheostomy site.  After it became clear that he was actively bleeding, I called Dr. Wilburn Cornelia back to encourage him to come quickly and see the patient.  Around that time I had consulted intensivist, Dr. Nelda Marseille; who was in the ED and was planning on assisting with care.  Dr. Nelda Marseille talked to Dr. Wilburn Cornelia.  Dr. Wilburn Cornelia came to the ED to evaluate the patient and took him directly to the OR.  Medical Decision Making: Bleeding from tracheostomy site.  Unclear if the bleeding is postoperative versus the existing laryngeal cancer which could be a source of bleeding.  Emergent and crossmatch blood and IV fluid boluses were ordered.  Hemoglobin dropped about a gram from earlier  this morning.  Patient has end-stage renal disease.  He will require close monitoring after administration of fluid and blood and may need dialysis.   CRITICAL CARE-yes Performed by: Daleen Bo  Nursing Notes Reviewed/ Care Coordinated Applicable Imaging Reviewed Interpretation of Laboratory Data incorporated into ED treatment   Plan:-Admit, after procedure for monitoring and treatment.  Final Clinical Impressions(s) / ED Diagnoses   Final diagnoses:  Bleeding  Anemia, unspecified type  End stage renal disease ([place])  Laryngeal cancer [facility])    ED Discharge Orders    None       Daleen Bo, MD 04/24/18 1551    Daleen Bo, MD 04/25/18 [phone]

## 2018-04-24 NOTE — Consult Note (Addendum)
NAME:  Jimmy [place], MRN:  [id number], DOB:  [birth year], LOS: 0 ADMISSION DATE:  04/24/2018, CONSULTATION DATE:  04/24/2018 REFERRING MD:  EDP Eulis Foster, CHIEF COMPLAINT:  Tracheostomy stoma bleeding   Brief History   62 year old male with PMH of laryngeal cancer who was sent intubated from [facility] hospital to [facility] where he failed extubation and ENT was called for a tracheostomy.  In [facility] the patient had an episode of tracheostomy stoma bleeding that seem to improve but subsequently had a coughing spell and very large volume arterial bleeding.  Patient is unable to provide history.  History of present illness   62 year old male with PMH of laryngeal cancer who was sent intubated from [facility] hospital to [facility] where he failed extubation and ENT was called for a tracheostomy.  In [facility] the patient had an episode of tracheostomy stoma bleeding that seem to improve but subsequently had a coughing spell and very large volume arterial bleeding.  Patient is unable to provide history.  Past Medical History  Laryngeal cancer - Squamous Tracheostomy status COPD ESRD HTN  Significant Hospital Events   11/27 tracheostomy bleed  Consults:  PCCM  Procedures:  11/27 to the OR for massive bleed  Significant Diagnostic Tests:  11/27 OR for stabilization of bleed and biopsy  Micro Data:  N/A  Antimicrobials:  N/A   Interim history/subjective:  Sedated and trached  Objective   Blood pressure 131/82, pulse (!) 130, temperature 98 F (36.7 C), temperature source Axillary, resp. rate (!) 25, height '5\' 10"'  (1.778 m), weight 77 kg, SpO2 100 %.    Vent Mode: PRVC FiO2 (%):  [28 %] 28 % Set Rate:  [16 bmp] 16 bmp Vt Set:  [550 mL] 550 mL PEEP:  [5 cmH20] 5 cmH20  No intake or output data in the 24 hours ending 04/24/18 1516 Filed Weights   04/24/18 1400  Weight: 77 kg    Examination: General: Chronically ill appearing male, appears very scared HENT: [place]/AT, PERRL, EOM-I and MMM.   Trach site arterial bleed noted with blood in the mouth that is merging from the airway Lungs: Coarse BS diffusely Cardiovascular: Sinus tach, Nl S1/S2 and -M/R/G Abdomen: Soft, NT, ND and +BS Extremities: -edema and -tenderness Neuro: Alert and interactive, moving all ext to comand Skin: [facility] Problem list   N/A  Assessment & Plan:  62 year old male with squamous cell laryngeal cancer who presents to PCCM with tracheal what appears to be arterial bleed.  Patient was evaluated in the ER and all attempts to intubate from above have been unsuccessful in passing beyond the cord.  Bronchoscopically there was significant blood below the trach going into the airway but bleeding was from above.  Discussed with ENT - Dr. Wilburn Cornelia.  Tracheostomy bleed:  - Attempt intubation from above  - Remove tracheostomy   - Attempt to tamponade  - Failed so send to the OR for anesthesia  Laryngeal cancer:   - Needs oncology to see  - Will need to discuss prognosis  Acute respiratory failure: presumed due to airway obstruction  - Full vent support  - May begin weaning trials in AM if ENT is able to stabilize the airway  Hypoxemia:  - Titrate O2 for sat of 88-92%  Hemorrhagic shock:  - Transfuse 2 units in the OR.  - Will need to ascertain how much was transfused in the OR  - CBC post OR visit  - H&H q6  Hemorrhagic anemia:  - T&S 2 units pRBC  - Check coags  - May need to consider FFP and platelets pending counts  ESRD:  - Nephrology consult to be called in AM  - Check BMET after all the transfusion  - Replace electrolytes as indicated  - BMET in AM  DM:  - CBGs  - ISS  Malnutrition  - Consult nutrition for TF as per nutrition  Sedation:   - PRN fentanyl  PCCM will follow  Labs   CBC: Recent Labs  Lab 04/19/18 1937 04/20/18 0306 04/20/18 0527 04/22/18 0338 04/23/18 0604 04/24/18 0958 04/24/18 1406 04/24/18 1424  WBC 23.9* 22.4* 18.8*  --  14.5*  13.3* 13.5*  --   NEUTROABS 21.3*  --   --   --   --   --  12.1*  --   HGB 12.6* 12.1* 11.5* 10.5* 7.8* 7.7* 7.5* 6.8*  HCT 40.0 38.8* 37.0* 31.0* 25.0* 24.9* 25.6* 20.0*  MCV 92.4 94.2 94.9  --  94.7 94.7 98.5  --   PLT 200 176 152  --  101* 105* 112*  --     Basic Metabolic Panel: Recent Labs  Lab 04/20/18 0527 04/20/18 1649 04/21/18 0535 04/21/18 0749 04/21/18 1150 04/21/18 1823 04/22/18 0338 04/23/18 0604 04/24/18 0958 04/24/18 1424  NA 135  --   --  138 134*  --  133* 136 135 134*  K 6.2*  --   --  6.5* 3.4*  --  4.7 3.7 4.2 4.1  CL 94*  --   --  98 96*  --  99 102 102 99  CO2 20*  --   --  17* 24  --   --  24 20*  --   GLUCOSE 117*  --   --  117* 102*  --  142* 115* 121* 96  BUN 183*  --   --  232* 63*  --  109* 63* 98* 111*  CREATININE 9.35*  --   --  10.17* 3.74*  --  6.80* 4.13* 6.28* 6.90*  CALCIUM 8.5*  --   --  7.9* 8.0*  --   --  7.6* 7.5*  --   MG 3.1* 3.4* 3.6*  --  2.3 2.5*  --   --   --   --   PHOS 18.0* 18.6* 19.1* 19.2*  --  9.1*  --   --  8.3*  --    GFR: Estimated Creatinine Clearance: 11.8 mL/min (A) (by C-G formula based on SCr of 6.9 mg/dL (H)). Recent Labs  Lab 04/20/18 0306 04/20/18 0527 04/23/18 0604 04/24/18 0958 04/24/18 1406  WBC 22.4* 18.8* 14.5* 13.3* 13.5*  LATICACIDVEN 0.8 1.1  --   --   --     Liver Function Tests: Recent Labs  Lab 04/19/18 1937 04/21/18 0749 04/24/18 0958  AST 34  --   --   ALT 81*  --   --   ALKPHOS 74  --   --   BILITOT 1.2  --   --   PROT 6.1*  --   --   ALBUMIN 2.6* 2.2* 1.7*   No results for input(s): LIPASE, AMYLASE in the last 168 hours. No results for input(s): AMMONIA in the last 168 hours.  ABG    Component Value Date/Time   PHART 7.352 04/22/2018 0332   PCO2ART 45.1 04/22/2018 0332   PO2ART 120.0 (H) 04/22/2018 0332   HCO3 25.0 04/22/2018 0332   TCO2 26 04/24/2018 1424  ACIDBASEDEF 1.0 04/22/2018 0332   O2SAT 98.0 04/22/2018 0332     Coagulation Profile: Recent Labs  Lab  04/19/18 1937 04/23/18 0604  INR 1.08 1.16    Cardiac Enzymes: Recent Labs  Lab 04/24/18 0958 04/24/18 1407  CKTOTAL 137  --   CKMB 6.1*  --   TROPONINI 0.36* 0.30*    HbA1C: No results found for: HGBA1C  CBG: Recent Labs  Lab 04/22/18 0022 04/22/18 0331 04/22/18 0810 04/22/18 1125 04/22/18 1508  GLUCAP 130* 130* 108* 109* 107*    Review of Systems:   Unattainable  Past Medical History  He,  has a past medical history of COPD (chronic obstructive pulmonary disease) ([place]), ESRD on peritoneal dialysis ([place]), HTN (hypertension), and Squamous cell carcinoma of larynx ([place]).   Surgical History    Past Surgical History:  Procedure Laterality Date  . TRACHEOSTOMY TUBE PLACEMENT N/A 04/19/2018   Procedure: TRACHEOSTOMY;  Surgeon: Helayne Seminole, MD;  Location[facility] OR;  Service: ENT;  Laterality: N/A;     Social History   reports that he has been smoking cigarettes. He has been smoking about 1.00 pack per day. He has never used smokeless tobacco.   Family History   His family history is not on file.   Allergies Allergies  Allergen Reactions  . Acetaminophen Rash    "high heart rate"   . Tea   . Bactrim [Sulfamethoxazole-Trimethoprim] Rash  . Ceftriaxone Itching and Rash    Association NOT definite; no urticaria or anaphylaxis; just "little red bumps" and itching  . Sulfa Antibiotics Rash     Home Medications  Prior to Admission medications   Medication Sig Start Date End Date Taking? Authorizing Provider  Amino Acids-Protein Hydrolys (FEEDING SUPPLEMENT, PRO-STAT SUGAR FREE 64,) LIQD Place 60 mLs into feeding tube 2 (two) times daily between meals. 04/22/18  Yes Ollis, Brandi L, NP  arformoterol (BROVANA) 15 MCG/2ML NEBU Take 2 mLs (15 mcg total) by nebulization 2 (two) times daily. 04/22/18  Yes Ollis, Brandi L, NP  budesonide (PULMICORT) 0.5 MG/2ML nebulizer solution Take 2 mLs (0.5 mg total) by nebulization 2 (two) times daily. 04/22/18  Yes Ollis,  Brandi L, NP  calcitRIOL (ROCALTROL) 0.25 MCG capsule Take 0.25 mcg by mouth once a week.   Yes [provider]  calcium acetate (PHOSLO) 667 MG capsule Take 667 mg by mouth 3 (three) times daily with meals.    Yes [provider]  ceFEPIme 1 g in sodium chloride 0.9 % 100 mL Inject 1 g into the vein daily. 04/22/18  Yes Donita Brooks, NP  Chlorhexidine Gluconate Cloth 2 % PADS Apply 6 each topically daily at 6 (six) AM. 04/23/18  Yes Ollis, Brandi L, NP  chlorhexidine gluconate, MEDLINE KIT, (PERIDEX) 0.12 % solution 15 mLs by Mouth Rinse route 2 (two) times daily. 04/22/18  Yes Ollis, Cleaster Corin, NP  fentaNYL (SUBLIMAZE) 100 MCG/2ML injection Inject 1 mL (50 mcg total) into the vein every 2 (two) hours as needed (to maintain RASS goal.). 04/22/18  Yes Ollis, Brandi L, NP  heparin 1000 unit/mL SOLN injection 3 mLs (3,000 Units total) by Dialysis route as needed (in dialysis). 04/22/18  Yes Ollis, Brandi L, NP  heparin 5000 UNIT/ML injection Inject 1 mL (5,000 Units total) into the skin every 8 (eight) hours. 04/22/18  Yes Ollis, Brandi L, NP  ipratropium-albuterol (DUONEB) 0.5-2.5 (3) MG/3ML SOLN Take 3 mLs by nebulization every 6 (six) hours. 04/22/18  Yes Donita Brooks, NP  mouth  rinse LIQD solution 15 mLs by Mouth Rinse route every 2 (two) hours. 04/22/18  Yes Ollis, Brandi L, NP  nicotine (NICODERM CQ - DOSED IN MG/24 HOURS) 21 mg/24hr patch Place 1 patch (21 mg total) onto the skin daily. 04/23/18  Yes Ollis, Cleaster Corin, NP  Nutritional Supplements (FEEDING SUPPLEMENT, NEPRO CARB STEADY,) LIQD Take 1,000 mLs by mouth daily. 04/22/18  Yes Ollis, Cleaster Corin, NP  sevelamer carbonate (RENVELA) 0.8 g PACK packet Take 0.8 g by mouth 3 (three) times daily with meals.   Yes [provider]  sodium chloride flush (NS) 0.9 % SOLN Inject 3 mLs into the vein every 12 (twelve) hours. 04/22/18  Yes Ollis, Brandi L, NP  sodium chloride flush (NS) 0.9 % SOLN Inject 3 mLs into the vein  as needed. 04/22/18  Yes Donita Brooks, NP    The patient is critically ill with multiple organ systems failure and requires high complexity decision making for assessment and support, frequent evaluation and titration of therapies, application of advanced monitoring technologies and extensive interpretation of multiple databases.   Critical Care Time devoted to patient care services described in this note is  95  Minutes. This time reflects time of care of this signee Dr Jennet Maduro. This critical care time does not reflect procedure time, or teaching time or supervisory time of PA/NP/Med student/Med Resident etc but could involve care discussion time.  Rush Farmer, M.D. [facility] Pulmonary/Critical Care Medicine. Pager: [phone]. After hours pager: [phone].

## 2018-04-24 NOTE — ED Notes (Signed)
Emergency Release blood started by [facility] RN @999ml  an hour

## 2018-04-24 NOTE — Progress Notes (Signed)
Pulmonary Critical Care Medicine [place]   PULMONARY CRITICAL CARE SERVICE  PROGRESS NOTE  Date of Service: 04/24/2018  Jimmy Martin  YTK:[id number]  DOB: 03/06/1956   DOA: 04/24/2018  Referring Physician: Merton Border, MD  HPI: Jimmy Martin is a 62 y.o. male seen for follow up of Acute on Chronic Respiratory Failure.  Patient this morning presented with having increasing bleeding which was of sudden onset around the tracheostomy site.  There did not appear to be any blood coming in from the trachea itself.  Of note he has a history of head neck cancer and had to have an emergent tracheostomy on his last encounter in the emergency department.  Because of the degree of bleeding and because of his unstable airway in the past it would be prudent to have this patient evaluated by ENT emergently  Medications: Reviewed on Rounds  Physical Exam:  Vitals: Temperature 99.0 pulse 115 respiratory rate 32 blood pressure 111/59 saturations 100%  Ventilator Settings mode of ventilation assist control FiO2 28% tidal volume 420 PEEP 5  . General: Comfortable at this time . Eyes: Grossly normal lids, irises & conjunctiva . ENT: grossly tongue is normal . Neck: no obvious mass . Cardiovascular: S1 S2 normal no gallop . Respiratory: Scattered rhonchi are noted bilaterally . Abdomen: soft . Skin: no rash seen on limited exam . Musculoskeletal: not rigid . Psychiatric:unable to assess . Neurologic: no seizure no involuntary movements         Lab Data:   Basic Metabolic Panel: Recent Labs  Lab 04/20/18 0527 04/20/18 1649 04/21/18 0535 04/21/18 0749 04/21/18 1150 04/21/18 1823 04/22/18 0338 04/23/18 0604 04/24/18 0958  NA 135  --   --  138 134*  --  133* 136 135  K 6.2*  --   --  6.5* 3.4*  --  4.7 3.7 4.2  CL 94*  --   --  98 96*  --  99 102 102  CO2 20*  --   --  17* 24  --   --  24 20*  GLUCOSE 117*  --   --  117* 102*  --  142* 115* 121*  BUN 183*  --    --  232* 63*  --  109* 63* 98*  CREATININE 9.35*  --   --  10.17* 3.74*  --  6.80* 4.13* 6.28*  CALCIUM 8.5*  --   --  7.9* 8.0*  --   --  7.6* 7.5*  MG 3.1* 3.4* 3.6*  --  2.3 2.5*  --   --   --   PHOS 18.0* 18.6* 19.1* 19.2*  --  9.1*  --   --  8.3*    ABG: Recent Labs  Lab 04/19/18 1750 04/20/18 0330 04/20/18 0609 04/21/18 2038 04/22/18 0332  PHART 7.351 7.215* 7.261* 7.428 7.352  PCO2ART 40.6 52.2* 49.2* 37.4 45.1  PO2ART 78.3* 342* 357.0* 155.0* 120.0*  HCO3 21.9 20.3 22.1 24.7 25.0  O2SAT 93.5 98.8 100.0 99.0 98.0    Liver Function Tests: Recent Labs  Lab 04/19/18 1937 04/21/18 0749 04/24/18 0958  AST 34  --   --   ALT 81*  --   --   ALKPHOS 74  --   --   BILITOT 1.2  --   --   PROT 6.1*  --   --   ALBUMIN 2.6* 2.2* 1.7*   No results for input(s): LIPASE, AMYLASE in the last 168 hours. No results for  input(s): AMMONIA in the last 168 hours.  CBC: Recent Labs  Lab 04/19/18 1937 04/20/18 0306 04/20/18 0527 04/22/18 0338 04/23/18 0604 04/24/18 0958  WBC 23.9* 22.4* 18.8*  --  14.5* 13.3*  NEUTROABS 21.3*  --   --   --   --   --   HGB 12.6* 12.1* 11.5* 10.5* 7.8* 7.7*  HCT 40.0 38.8* 37.0* 31.0* 25.0* 24.9*  MCV 92.4 94.2 94.9  --  94.7 94.7  PLT 200 176 152  --  101* 105*    Cardiac Enzymes: Recent Labs  Lab 04/24/18 0958  CKTOTAL 137  CKMB 6.1*  TROPONINI 0.36*    BNP (last 3 results) No results for input(s): BNP in the last 8760 hours.  ProBNP (last 3 results) No results for input(s): PROBNP in the last 8760 hours.  Radiological Exams: Dg Abdomen Peg Tube Location  Result Date: 04/22/2018 CLINICAL DATA:  62 y/o M; PEG tube placement. 50 cc contrast administered via PEG tube. EXAM: ABDOMEN - 1 VIEW COMPARISON:  04/19/2018 abdomen radiograph. FINDINGS: Cutaneous gastrostomy tube balloon projects over gastric body. Contrast is pooling within the gastric fundus and extends throughout bowel to the rectum. No extravasation is identified on this  single frontal view. Bilateral ureter stents noted. Normal bowel gas pattern. Bones are unremarkable. IMPRESSION: Contrast is pooling within the gastric fundus and extends throughout bowel to the rectum. No extravasation is identified on this single frontal view. Electronically Signed   By: Kristine Garbe M.D.   On: 04/22/2018 22:33    Assessment/Plan Active Problems:   * No active hospital problems. *   1. Acute on chronic respiratory failure with hypoxia at this time patient will be emergently transferred to the ED for ENT evaluation.  Will hopefully once he is stabilized we will transfer back to the floor.  We will continue with supportive care 2. Acute tracheal bleed as above transferred to the emergency department for ENT evaluation 3. 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 Acute encephalopathy remains grossly unchanged 5. Healthcare associated pneumonia treated we will continue with follow-up   I have personally seen and evaluated the patient, evaluated laboratory and imaging results, formulated the assessment and plan and placed orders. The Patient requires high complexity decision making for assessment and support.  Patient is critically ill in danger of cardiac arrest with acute tracheal bleed time 35 minutes critical care  Case was discussed on Rounds with the Respiratory Therapy Staff  Allyne Gee, MD [facility] Pulmonary Critical Care Medicine Sleep Medicine

## 2018-04-24 NOTE — Anesthesia Preprocedure Evaluation (Addendum)
Anesthesia Evaluation  Patient identified by MRN, date of birth, ID band Patient unresponsive    Reviewed: Allergy & Precautions, H&P , NPO status , Patient's Chart, lab work & pertinent test results, Unable to perform ROS - Chart review onlyPreop documentation limited or incomplete due to emergent nature of procedure.  History of Anesthesia Complications Negative for: history of anesthetic complications  Airway Mallampati: Trach       Dental no notable dental hx. (+) Teeth Intact, Dental Advisory Given   Pulmonary COPD,  COPD inhaler, Current Smoker,   Acute on chronic respiratory failure s/p tracheostomy 11/22, now with bleeding around the trach   Pulmonary exam normal breath sounds clear to auscultation       Cardiovascular hypertension,  Rhythm:Regular Rate:Tachycardia     Neuro/Psych negative neurological ROS  negative psych ROS   GI/Hepatic negative GI ROS, Neg liver ROS,   Endo/Other  negative endocrine ROS  Renal/GU ESRF and DialysisRenal disease  negative genitourinary   Musculoskeletal   Abdominal   Peds  Hematology  (+) anemia ,  Thrombocytopenia (PLT 105)   Anesthesia Other Findings Hx SCC larynx s/p radiation therapy   Reproductive/Obstetrics negative OB ROS                           Anesthesia Physical Anesthesia Plan  ASA: IV and emergent  Anesthesia Plan: General   Post-op Pain Management:    Induction: Intravenous  PONV Risk Score and Plan: 2 and Treatment may vary due to age or medical condition and Ondansetron  Airway Management Planned: Tracheostomy  Additional Equipment:   Intra-op Plan:   Post-operative Plan: Post-operative intubation/ventilation  Informed Consent: I have reviewed the patients History and Physical, chart, labs and discussed the procedure including the risks, benefits and alternatives for the proposed anesthesia with the patient or  authorized representative who has indicated his/her understanding and acceptance.   Dental advisory given  Plan Discussed with: CRNA  Anesthesia Plan Comments:        Anesthesia Quick Evaluation

## 2018-04-24 NOTE — Consult Note (Signed)
[facility] CONSULT NOTE    Date: 04/24/2018                  Patient Name:  Jimmy Martin  MRN: [id number]  DOB: 1955/09/15  Age / Sex: 62 y.o., male         PCP: Default, Provider, MD                 Service Requesting Consult: Hospitalist                 Reason for Consult: Management of ESRD            History of Present Illness: Patient is a 62 y.o. male with a PMHx of ESRD on HD, airway obstruction status post tracheostomy placement, squamous cell carcinoma of the larynx, acute on chronic respiratory failure, COPD, pneumonia, atrial fibrillation, status post PEG tube placement, who was admitted to [facility] on 04/22/2018 for ongoing management.  She was admitted to [facility] from April 19, 2018 to April 22, 2018.  He was subsequently transferred here for ongoing care.  His tracheostomy was placed on April 20, 2018.  Prior to this patient was in [place] and had developed a COPD exacerbation as well as pneumonia.  He required intubation during that admission.  Shortly after extubation however he developed stridor again.  Therefore he was sent to [facility] for tracheostomy placement.  Previously the patient was on PD and recently transitioned to hemodialysis via right internal jugular dialysis catheter.   Medications: Outpatient medications: Medications Prior to Admission  Medication Sig Dispense Refill Last Dose  . Amino Acids-Protein Hydrolys (FEEDING SUPPLEMENT, PRO-STAT SUGAR FREE 64,) LIQD Place 60 mLs into feeding tube 2 (two) times daily between meals. 900 mL 0   . arformoterol (BROVANA) 15 MCG/2ML NEBU Take 2 mLs (15 mcg total) by nebulization 2 (two) times daily. 120 mL    . budesonide (PULMICORT) 0.5 MG/2ML nebulizer solution Take 2 mLs (0.5 mg total) by nebulization 2 (two) times daily.  12   . calcitRIOL (ROCALTROL) 0.25 MCG capsule Take 0.25 mcg by mouth once a week.     . calcium acetate (PHOSLO) 667 MG capsule  Take 667 mg by mouth 3 (three) times daily with meals.    unk  . ceFEPIme 1 g in sodium chloride 0.9 % 100 mL Inject 1 g into the vein daily.     . Chlorhexidine Gluconate Cloth 2 % PADS Apply 6 each topically daily at 6 (six) AM.     . Chlorhexidine Gluconate Cloth 2 % PADS Apply 6 each topically daily at 6 (six) AM.     . chlorhexidine gluconate, MEDLINE KIT, (PERIDEX) 0.12 % solution 15 mLs by Mouth Rinse route 2 (two) times daily. 120 mL 0   . fentaNYL (SUBLIMAZE) 100 MCG/2ML injection Inject 1 mL (50 mcg total) into the vein every 2 (two) hours as needed (to maintain RASS goal.). 2 mL 0   . heparin 1000 unit/mL SOLN injection 3 mLs (3,000 Units total) by Dialysis route as needed (in dialysis).     . heparin 5000 UNIT/ML injection Inject 1 mL (5,000 Units total) into the skin every 8 (eight) hours. 1 mL    . ipratropium-albuterol (DUONEB) 0.5-2.5 (3) MG/3ML SOLN Take 3 mLs by nebulization every 6 (six) hours. 360 mL    . mouth rinse LIQD solution 15 mLs by Mouth Rinse route every 2 (two) hours.  0   . nicotine (NICODERM  CQ - DOSED IN MG/24 HOURS) 21 mg/24hr patch Place 1 patch (21 mg total) onto the skin daily. 28 patch 0   . Nutritional Supplements (FEEDING SUPPLEMENT, NEPRO CARB STEADY,) LIQD Take 1,000 mLs by mouth daily.  0   . sevelamer carbonate (RENVELA) 0.8 g PACK packet Take 0.8 g by mouth 3 (three) times daily with meals.   unk  . sodium chloride flush (NS) 0.9 % SOLN Inject 3 mLs into the vein every 12 (twelve) hours.     . sodium chloride flush (NS) 0.9 % SOLN Inject 3 mLs into the vein as needed.        Allergies: Allergies  Allergen Reactions  . Acetaminophen Rash    "high heart rate"   . Bactrim [Sulfamethoxazole-Trimethoprim] Rash  . Ceftriaxone Itching and Rash    Association NOT definite; no urticaria or anaphylaxis; just "little red bumps" and itching  . Sulfa Antibiotics Rash      Past Medical History: Past Medical History:  Diagnosis Date  . COPD (chronic  obstructive pulmonary disease) (HCC)    Severe  . ESRD on peritoneal dialysis ([place])   . HTN (hypertension)   . Squamous cell carcinoma of larynx [facility])    s/p radiation therapy     Past Surgical History: Past Surgical History:  Procedure Laterality Date  . TRACHEOSTOMY TUBE PLACEMENT N/A 04/19/2018   Procedure: TRACHEOSTOMY;  Surgeon: Helayne Seminole, MD;  Location: [facility] OR;  Service: ENT;  Laterality: N/A;     Family History: History reviewed. No pertinent family history.   Social History: Social History   Socioeconomic History  . Marital status: Married    Spouse name: Not on file  . Number of children: Not on file  . Years of education: Not on file  . Highest education level: Not on file  Occupational History  . Not on file  Social Needs  . Financial resource strain: Not on file  . Food insecurity:    Worry: Not on file    Inability: Not on file  . Transportation needs:    Medical: Not on file    Non-medical: Not on file  Tobacco Use  . Smoking status: Current Every Day Smoker    Packs/day: 1.00    Types: Cigarettes  . Smokeless tobacco: Never Used  Substance and Sexual Activity  . Alcohol use: Not on file  . Drug use: Not on file  . Sexual activity: Not on file  Lifestyle  . Physical activity:    Days per week: Not on file    Minutes per session: Not on file  . Stress: Not on file  Relationships  . Social connections:    Talks on phone: Not on file    Gets together: Not on file    Attends religious service: Not on file    Active member of club or organization: Not on file    Attends meetings of clubs or organizations: Not on file    Relationship status: Not on file  . Intimate partner violence:    Fear of current or ex partner: Not on file    Emotionally abused: Not on file    Physically abused: Not on file    Forced sexual activity: Not on file  Other Topics Concern  . Not on file  Social History Narrative  . Not on file     Review of  Systems: Unable to obtain as he's on the ventilator.   Vital Signs: Temperature 99 pulse 115 respirations  32 blood pressure 111/59 Weight trends: There were no vitals filed for this visit.  Physical Exam: General: Critically ill appaering  Head: Normocephalic, atraumatic.  Eyes: Anicteric, EOMI  Nose: Mucous membranes moist, not inflammed, nonerythematous.  Throat: Oropharynx nonerythematous, no exudate appreciated.   Neck: Tracheostomy in place  Lungs:  Scattered rhonchi, vent assisted  Heart: S1S2 tachycardic  Abdomen:  Soft NTND PEG in place  Extremities: No pretibial edema.  Neurologic: Sedated at the moment  Skin: No visible rashes, scars.    Lab results: Basic Metabolic Panel: Recent Labs  Lab 04/20/18 0527 04/20/18 1649 04/21/18 0535 04/21/18 0749 04/21/18 1150 04/21/18 1823 04/22/18 0338 04/23/18 0604  NA 135  --   --  138 134*  --  133* 136  K 6.2*  --   --  6.5* 3.4*  --  4.7 3.7  CL 94*  --   --  98 96*  --  99 102  CO2 20*  --   --  17* 24  --   --  24  GLUCOSE 117*  --   --  117* 102*  --  142* 115*  BUN 183*  --   --  232* 63*  --  109* 63*  CREATININE 9.35*  --   --  10.17* 3.74*  --  6.80* 4.13*  CALCIUM 8.5*  --   --  7.9* 8.0*  --   --  7.6*  MG 3.1* 3.4* 3.6*  --  2.3 2.5*  --   --   PHOS 18.0* 18.6* 19.1* 19.2*  --  9.1*  --   --     Liver Function Tests: Recent Labs  Lab 04/19/18 1937 04/21/18 0749  AST 34  --   ALT 81*  --   ALKPHOS 74  --   BILITOT 1.2  --   PROT 6.1*  --   ALBUMIN 2.6* 2.2*   No results for input(s): LIPASE, AMYLASE in the last 168 hours. No results for input(s): AMMONIA in the last 168 hours.  CBC: Recent Labs  Lab 04/19/18 1937 04/20/18 0306 04/20/18 0527 04/22/18 0338 04/23/18 0604  WBC 23.9* 22.4* 18.8*  --  14.5*  NEUTROABS 21.3*  --   --   --   --   HGB 12.6* 12.1* 11.5* 10.5* 7.8*  HCT 40.0 38.8* 37.0* 31.0* 25.0*  MCV 92.4 94.2 94.9  --  94.7  PLT 200 176 152  --  101*    Cardiac  Enzymes: No results for input(s): CKTOTAL, CKMB, CKMBINDEX, TROPONINI in the last 168 hours.  BNP: Invalid input(s): POCBNP  CBG: Recent Labs  Lab 04/22/18 0022 04/22/18 0331 04/22/18 0810 04/22/18 1125 04/22/18 1508  GLUCAP 130* 130* 108* 109* 107*    Microbiology: Results for orders placed or performed during the hospital encounter of 04/19/18  MRSA PCR Screening     Status: None   Collection Time: 04/20/18  1:26 AM  Result Value Ref Range Status   MRSA by PCR NEGATIVE NEGATIVE Final    Comment:        The GeneXpert MRSA Assay (FDA approved for NASAL specimens only), is one component of a comprehensive MRSA colonization surveillance program. It is not intended to diagnose MRSA infection nor to guide or monitor treatment for MRSA infections. Performed at [facility], [place] [address], [place], [place] [address]   Respiratory Panel by PCR     Status: Abnormal   Collection Time: 04/20/18  2:08 AM  Result Value  Ref Range Status   Adenovirus NOT DETECTED NOT DETECTED Final   Coronavirus 229E NOT DETECTED NOT DETECTED Final   Coronavirus HKU1 NOT DETECTED NOT DETECTED Final   Coronavirus NL63 NOT DETECTED NOT DETECTED Final   Coronavirus OC43 NOT DETECTED NOT DETECTED Final   Metapneumovirus NOT DETECTED NOT DETECTED Final   Rhinovirus / Enterovirus DETECTED (A) NOT DETECTED Final   Influenza A NOT DETECTED NOT DETECTED Final   Influenza B NOT DETECTED NOT DETECTED Final   Parainfluenza Virus 1 NOT DETECTED NOT DETECTED Final   Parainfluenza Virus 2 NOT DETECTED NOT DETECTED Final   Parainfluenza Virus 3 NOT DETECTED NOT DETECTED Final   Parainfluenza Virus 4 NOT DETECTED NOT DETECTED Final   Respiratory Syncytial Virus NOT DETECTED NOT DETECTED Final   Bordetella pertussis NOT DETECTED NOT DETECTED Final   Chlamydophila pneumoniae NOT DETECTED NOT DETECTED Final   Mycoplasma pneumoniae NOT DETECTED NOT DETECTED Final    Comment: Performed at [facility], [place] [address] [place], [place] [address]  Culture, blood (routine x 2)     Status: None (Preliminary result)   Collection Time: 04/20/18  2:55 AM  Result Value Ref Range Status   Specimen Description BLOOD RIGHT ANTECUBITAL  Final   Special Requests   Final    BOTTLES DRAWN AEROBIC AND ANAEROBIC Blood Culture adequate volume   Culture   Final    NO GROWTH 4 DAYS Performed at [facility], [place] [address], [place] [address], [place] [address]    Report Status PENDING  Incomplete  Culture, blood (routine x 2)     Status: None (Preliminary result)   Collection Time: 04/20/18  3:04 AM  Result Value Ref Range Status   Specimen Description BLOOD RIGHT HAND  Final   Special Requests   Final    BOTTLES DRAWN AEROBIC ONLY Blood Culture results may not be optimal due to an inadequate volume of blood received in culture bottles   Culture   Final    NO GROWTH 4 DAYS Performed at [facility], [place] [address] [place], [place] [address]    Report Status PENDING  Incomplete  Culture, respiratory (non-expectorated)     Status: None   Collection Time: 04/20/18  3:56 AM  Result Value Ref Range Status   Specimen Description TRACHEAL ASPIRATE  Final   Special Requests NONE  Final   Gram Stain   Final    ABUNDANT WBC PRESENT,BOTH PMN AND MONONUCLEAR FEW GRAM POSITIVE COCCI RARE GRAM VARIABLE ROD    Culture   Final    FEW Consistent with normal respiratory flora. Performed at [facility], [place] [address] [place], [place] [address]    Report Status 04/22/2018 FINAL  Final    Coagulation Studies: Recent Labs    04/23/18 0604  LABPROT 14.7  INR 1.16    Urinalysis: No results for input(s): COLORURINE, LABSPEC, PHURINE, GLUCOSEU, HGBUR, BILIRUBINUR, KETONESUR, PROTEINUR, UROBILINOGEN, NITRITE, LEUKOCYTESUR in the last 72 hours.  Invalid input(s): APPERANCEUR    Imaging: Dg Abdomen Peg Tube Location  Result Date: 04/22/2018 CLINICAL DATA:  62 y/o M; PEG tube  placement. 50 cc contrast administered via PEG tube. EXAM: ABDOMEN - 1 VIEW COMPARISON:  04/19/2018 abdomen radiograph. FINDINGS: Cutaneous gastrostomy tube balloon projects over gastric body. Contrast is pooling within the gastric fundus and extends throughout bowel to the rectum. No extravasation is identified on this single frontal view. Bilateral ureter stents noted. Normal bowel gas pattern. Bones are  unremarkable. IMPRESSION: Contrast is pooling within the gastric fundus and extends throughout bowel to the rectum. No extravasation is identified on this single frontal view. Electronically Signed   By: Kristine Garbe M.D.   On: 04/22/2018 22:33      Assessment & Plan: Pt is a 62 y.o. 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  1.  ESRD on HD.  We plan to perform hemodialysis today using right internal jugular PermCath.  Thereafter we will maintain him on a TTS schedule.  2.  Acute respiratory failure.  Tracheostomy recently placed.  Continue ventilatory support at this time.  Weaning per Dr. Yancey Flemings.  3.  Secondary hyperparathyroidism.  Phosphorus has been high recently.  Will reevaluate this over the course of the hospitalization.  4.  Anemia of chronic kidney disease.  Hold off on erythropoietin stimulating agents at this time.  5.  Thanks for consultation.

## 2018-04-24 NOTE — Progress Notes (Signed)
   ENT Progress Note: POD #5 s/p Procedure(s): TRACHEOSTOMY, control of bleeding   Subjective: Acute peritracheal bleeding  Objective: Vital signs in last 24 hours: Temp:  [98 F (36.7 C)] 98 F (36.7 C) (11/27 1359) Pulse Rate:  [111-130] 130 (11/27 1455) Resp:  [25-28] 25 (11/27 1455) BP: (131)/(82) 131/82 (11/27 1455) SpO2:  [97 %-100 %] 100 % (11/27 1455) FiO2 (%):  [28 %] 28 % (11/27 1350) Weight:  [77 kg] 77 kg (11/27 1400) Weight change:     Intake/Output from previous day: No intake/output data recorded. Intake/Output this shift: No intake/output data recorded.  Labs: Recent Labs    04/24/18 0958 04/24/18 1406 04/24/18 1424  WBC 13.3* 13.5*  --   HGB 7.7* 7.5* 6.8*  HCT 24.9* 25.6* 20.0*  PLT 105* 112*  --    Recent Labs    04/23/18 0604 04/24/18 0958 04/24/18 1424  NA 136 135 134*  K 3.7 4.2 4.1  CL 102 102 99  CO2 24 20*  --   GLUCOSE 115* 121* 96  BUN 63* 98* 111*  CALCIUM 7.6* 7.5*  --     Studies/Results: Dg Abdomen Peg Tube Location  Result Date: 04/22/2018 CLINICAL DATA:  62 y/o M; PEG tube placement. 50 cc contrast administered via PEG tube. EXAM: ABDOMEN - 1 VIEW COMPARISON:  04/19/2018 abdomen radiograph. FINDINGS: Cutaneous gastrostomy tube balloon projects over gastric body. Contrast is pooling within the gastric fundus and extends throughout bowel to the rectum. No extravasation is identified on this single frontal view. Bilateral ureter stents noted. Normal bowel gas pattern. Bones are unremarkable. IMPRESSION: Contrast is pooling within the gastric fundus and extends throughout bowel to the rectum. No extravasation is identified on this single frontal view. Electronically Signed   By: Kristine Garbe M.D.   On: 04/22/2018 22:33   Dg Chest Port 1 View  Result Date: 04/24/2018 CLINICAL DATA:  Bleeding from tracheostomy EXAM: PORTABLE CHEST 1 VIEW COMPARISON:  04/20/2018 FINDINGS: Dialysis catheter on the right with tip at the  upper cavoatrial junction. Tracheostomy tube in place. Extensive artifact from overlying bedding or bandages. Chronic hyperinflation. Normal heart size. IMPRESSION: 1. Artifact from bandages or bedding. There is no definite lung opacity. 2. Tracheostomy tube is well seated. 3. COPD. Electronically Signed   By: Monte Fantasia M.D.   On: 04/24/2018 14:26     PHYSICAL EXAM: Patient with acute hemorrhage from the peritracheal region.  Airway stable.   Assessment/Plan: Patient with acute peritracheal hemorrhage after recent tracheostomy.  History of laryngeal carcinoma.  Patient unable to be intubated from above.  Plan examination under anesthesia in the [place] control of hemorrhage and replacement of tracheostomy tube.    Jerrell Belfast 04/24/2018, 3:07 PM

## 2018-04-24 NOTE — ED Triage Notes (Signed)
[facility] He was trach here in the ED 11/22 and was admited [facility].  Today he was receiving a Neb treatment and coughed.  Afterwards he started to bleed profusly from around his trach NAD noted at this time

## 2018-04-24 NOTE — Progress Notes (Signed)
Responded to ED page to continue support to Pt and family.  Pt going to O.R urgently.  Escorted pt's son to second floor waiting/admission and gave instructions on how to proceed.  Jaclynn Major, [place], [facility], Pager [phone]

## 2018-04-24 NOTE — Op Note (Signed)
Operative Note: Control of tracheostomy hemorrhage  Patient: Jimmy Martin record number: [id number]  Date:04/24/2018  Pre-operative Indications: Acute tracheostomy hemorrhage  Postoperative Indications: Same  Surgical Procedure: Control of tracheostomy hemorrhage  Anesthesia: GET  Surgeon: Delsa Bern, M.D.  Assist: None  Complications: None  EBL: 50 cc   Brief History: The patient is a 62 y.o. 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  The patient presented to [facility] from a long-term care facility with increasing respiratory difficulty.  He underwent emergency awake tracheostomy by Dr. Blenda Nicely on 04/19/2018.  The patient had an episode of bleeding on 11/25 and was treated in the emergency department with parastomal packing.  The patient was transferred to [facility] for additional care.  He developed acute peristomal hemorrhage and was transferred to the emergency department for further evaluation.  Attempted intubation from above was unsuccessful because of laryngeal obstruction.  He was transferred to the [facility] OR for evaluation and management of tracheostomy hemorrhage.  The patient was sedated and ventilated and was unable to provide consent, surgical procedure was deemed an emergency and we proceeded without patient consent.   Surgical Procedure: The patient is brought to the operating room on 04/24/2018 and placed in supine position on the operating table. General endotracheal anesthesia was established without difficulty. When the patient was adequately anesthetized, surgical timeout was performed and correct identification of the patient and the surgical procedure. The patient was positioned and prepped and draped in sterile fashion.  The patient was positioned on the operating table and prepped and draped in sterile fashion.  The #6 Shiley tracheostomy tube was removed there was  an arterial hemorrhage along the right paratracheal region adjacent to the thyroid lobe.  This was clamped with a curved hemostat.  The patient's airway was suctioned and a 6-0 endotracheal tube was inserted without difficulty.  The patient had good ventilation and 100% saturations throughout.  With the airway stabilized the patient stoma was irrigated with saline and clots were removed from the stoma and the patient's upper trachea.  The bleeding site was reexamined and cauterized with monopolar suction cautery with good control.  No further bleeding noted.  The endotracheal tube was carefully withdrawn and clotted material was removed from the patient's trachea.  A #6 Shiley cuffed tracheostomy tube was then reinserted without difficulty again the patient had good airway and gas exchange.  Surgicel hemostatic packing was placed along the right paratracheal region.  Tracheostomy tube was sutured into position with interrupted 3-0 Ethilon suture and a Velcro trach tie was placed.  Patient was awakened from anesthetic and transferred from the operating room to the ICU in stable condition. There were no complications and blood loss was 50 cc.  Delsa Bern, M.D. [facility] 04/24/2018

## 2018-04-24 NOTE — Procedures (Signed)
Bronchoscopy Procedure Note PRINCETON NABOR [id number] 1956/05/02  Procedure: Bronchoscopy Indications: Diagnostic evaluation of the airways and Remove secretions  Procedure Details Consent: Unable to obtain consent because of emergent medical necessity. Time Out: Verified patient identification, verified procedure, site/side was marked, verified correct patient position, special equipment/implants available, medications/allergies/relevent history reviewed, required imaging and test results available.  Performed  In preparation for procedure, patient was given 100% FiO2. Sedation: Benzodiazepines, Muscle relaxants, Etomidate and Fentanyl  Airway entered and the following bronchi were examined: RUL, RML, RLL, LUL, LLL and Bronchi.   Bleeding all from above at the trach site, nothing from below the carina Bronchoscope removed.  , Patient placed back on 100% FiO2 at conclusion of procedure.    Evaluation Hemodynamic Status: BP stable throughout; O2 sats: stable throughout Patient's Current Condition: stable Specimens:  None Complications: No apparent complications Patient did tolerate procedure well.   Jennet Maduro 04/24/2018

## 2018-04-25 ENCOUNTER — Inpatient Hospital Stay (HOSPITAL_COMMUNITY): Payer: Medicare Other

## 2018-04-25 DIAGNOSIS — J9620 Acute and chronic respiratory failure, unspecified whether with hypoxia or hypercapnia: Secondary | ICD-10-CM

## 2018-04-25 LAB — PHOSPHORUS: Phosphorus: 9.1 mg/dL — ABNORMAL HIGH (ref 2.5–4.6)

## 2018-04-25 LAB — CBC
HCT: 23.3 % — ABNORMAL LOW (ref 39.0–52.0)
Hemoglobin: 7.6 g/dL — ABNORMAL LOW (ref 13.0–17.0)
MCH: 29.8 pg (ref 26.0–34.0)
MCHC: 32.6 g/dL (ref 30.0–36.0)
MCV: 91.4 fL (ref 80.0–100.0)
NRBC: 0.1 % (ref 0.0–0.2)
Platelets: 75 10*3/uL — ABNORMAL LOW (ref 150–400)
RBC: 2.55 MIL/uL — ABNORMAL LOW (ref 4.22–5.81)
RDW: 14.1 % (ref 11.5–15.5)
WBC: 17.4 10*3/uL — AB (ref 4.0–10.5)

## 2018-04-25 LAB — CULTURE, BLOOD (ROUTINE X 2)
Culture: NO GROWTH
Culture: NO GROWTH
SPECIAL REQUESTS: ADEQUATE

## 2018-04-25 LAB — HEPATITIS B SURFACE ANTIBODY, QUANTITATIVE: Hep B S AB Quant (Post): <3.1 m[IU]/mL — ABNORMAL LOW (ref >9.9)

## 2018-04-25 LAB — POCT I-STAT 3, ART BLOOD GAS (G3+)
Acid-base deficit: 7 mmol/L — ABNORMAL HIGH (ref 0.0–2.0)
BICARBONATE: 20.2 mmol/L (ref 20.0–28.0)
O2 Saturation: 100 %
PO2 ART: 208 mmHg — AB (ref 83.0–108.0)
TCO2: 22 mmol/L (ref 22–32)
pCO2 arterial: 46.2 mmHg (ref 32.0–48.0)
pH, Arterial: 7.249 — ABNORMAL LOW (ref 7.350–7.450)

## 2018-04-25 LAB — BASIC METABOLIC PANEL
ANION GAP: 11 (ref 5–15)
BUN: 112 mg/dL — ABNORMAL HIGH (ref 6–20)
CALCIUM: 7.6 mg/dL — AB (ref 8.9–10.3)
CO2: 17 mmol/L — ABNORMAL LOW (ref 22–32)
Chloride: 108 mmol/L (ref 98–111)
Creatinine, Ser: 7.31 mg/dL — ABNORMAL HIGH (ref 0.61–1.24)
GFR, EST AFRICAN AMERICAN: 9 mL/min — AB (ref >60)
GFR, EST NON AFRICAN AMERICAN: 7 mL/min — AB (ref >60)
Glucose, Bld: 97 mg/dL (ref 70–99)
Potassium: 4.9 mmol/L (ref 3.5–5.1)
SODIUM: 136 mmol/L (ref 135–145)

## 2018-04-25 LAB — PTH, INTACT AND CALCIUM
Calcium, Total (PTH): 7.6 mg/dL — ABNORMAL LOW (ref 8.6–10.2)
PTH: 335 pg/mL — ABNORMAL HIGH (ref 15–65)

## 2018-04-25 LAB — HEPATITIS B SURFACE ANTIGEN: HEP B S AG: NEGATIVE

## 2018-04-25 LAB — HEPATITIS B CORE ANTIBODY, TOTAL: HEP B C TOTAL AB: NEGATIVE

## 2018-04-25 LAB — HEMOGLOBIN AND HEMATOCRIT, BLOOD
HCT: 23.2 % — ABNORMAL LOW (ref 39.0–52.0)
HCT: 22.1 % — ABNORMAL LOW (ref 39.0–52.0)
HCT: 21.1 % — ABNORMAL LOW (ref 39.0–52.0)
HEMOGLOBIN: 7.4 g/dL — AB (ref 13.0–17.0)
Hemoglobin: 7.4 g/dL — ABNORMAL LOW (ref 13.0–17.0)
Hemoglobin: 6.9 g/dL — CL (ref 13.0–17.0)

## 2018-04-25 LAB — PREPARE RBC (CROSSMATCH)

## 2018-04-25 LAB — MAGNESIUM: Magnesium: 2.5 mg/dL — ABNORMAL HIGH (ref 1.7–2.4)

## 2018-04-25 MED ORDER — FENTANYL CITRATE (PF) 100 MCG/2ML IJ SOLN
12.5000 ug | Freq: Four times a day (QID) | INTRAMUSCULAR | Status: DC | PRN
  Administered 2018-04-25 – 2018-04-26 (×5): 12.5 ug via INTRAVENOUS
  Filled 2018-04-25 (×5): qty 2

## 2018-04-25 MED ORDER — FAMOTIDINE IN NACL 20-0.9 MG/50ML-% IV SOLN
20.0000 mg | INTRAVENOUS | Status: DC
  Administered 2018-04-25 – 2018-04-26 (×2): 20 mg via INTRAVENOUS
  Filled 2018-04-25 (×2): qty 50

## 2018-04-25 MED ORDER — SEVELAMER CARBONATE 2.4 G PO PACK
2.4000 g | PACK | Freq: Three times a day (TID) | ORAL | Status: DC
  Administered 2018-04-26 (×2): 2.4 g
  Filled 2018-04-25 (×5): qty 1

## 2018-04-25 MED ORDER — ORAL CARE MOUTH RINSE
15.0000 mL | OROMUCOSAL | Status: DC
  Administered 2018-04-25 – 2018-04-26 (×6): 15 mL via OROMUCOSAL

## 2018-04-25 MED ORDER — CHLORHEXIDINE GLUCONATE 0.12% ORAL RINSE (MEDLINE KIT)
15.0000 mL | Freq: Two times a day (BID) | OROMUCOSAL | Status: DC
  Administered 2018-04-25 – 2018-04-26 (×2): 15 mL via OROMUCOSAL

## 2018-04-25 MED ORDER — SODIUM CHLORIDE 0.9 % IV BOLUS
500.0000 mL | Freq: Once | INTRAVENOUS | Status: AC
  Administered 2018-04-25: 500 mL via INTRAVENOUS

## 2018-04-25 MED ORDER — CHLORHEXIDINE GLUCONATE CLOTH 2 % EX PADS
6.0000 | MEDICATED_PAD | Freq: Every day | CUTANEOUS | Status: DC
  Administered 2018-04-26: 6 via TOPICAL

## 2018-04-25 MED ORDER — SODIUM CHLORIDE 0.9% IV SOLUTION
Freq: Once | INTRAVENOUS | Status: AC
  Administered 2018-04-25: via INTRAVENOUS

## 2018-04-25 NOTE — Progress Notes (Signed)
Subjective: Interval History: 62 yr old with hx COPD, laryngeal Ca tx with Radrx and chemotx, ESRD (? [place]), BOO, admitted with COPD exac/pneu 11/9 to [facility].  Tx her to [facility] on 11.22 and had to have emergent entub/trach shortly after arrival.  Tx back to [facility] on 11/25 but developed bleeding around trach on 11/17, tx back to ED/OR for op to stop bleeding. Now in ICU, on vent via trach.  BP variable, not on pressors.  Seen by Dr.Schertz on 11/23 , and 11/25 and Dr. Joelyn Oms 11/25.  No HD since 11/25 .  Asked to see this pm.  Objective: Vital signs in last 24 hours: Temp:  [98 F (36.7 C)-98.8 F (37.1 C)] 98.4 F (36.9 C) (11/28 1201) Pulse Rate:  [90-144] 106 (11/28 1200) Resp:  [0-32] 14 (11/28 1200) BP: (71-131)/(41-85) 110/55 (11/28 1200) SpO2:  [96 %-100 %] 99 % (11/28 1200) FiO2 (%):  [28 %-60 %] 30 % (11/28 1120) Weight:  [77 kg] 77 kg (11/27 1400) Weight change:   Intake/Output from previous day: 11/27 0701 - 11/28 0700 In: 2593.8 [I.V.:1212.5; Blood:630; IV Piggyback:751.3] Out: 50 [Blood:50] Intake/Output this shift: Total I/O In: 346.4 [I.V.:346.4] Out: -   General appearance: obeys some but not all commands, falls asleep,sedated., on vent via trach. Neck: trach Resp: rales bibasilar and rhonchi bilaterally Cardio: S1, S2 normal and systolic murmur: systolic ejection 2/6, crescendo and decrescendo at 2nd left intercostal space GI: PEG epigastrium , incision L mid abdm Extremities: extremities normal, atraumatic, no cyanosis or edema  R [place] [facility] with some erythema and swelling  Lab Results: Recent Labs    04/24/18 1406  04/25/18 0319 04/25/18 0940  WBC 13.5*  --  17.4*  --   HGB 7.5*   < > 7.6* 7.4*  HCT 25.6*   < > 23.3* 23.2*  PLT 112*  --  75*  --    < > = values in this interval not displayed.   BMET:  Recent Labs    04/24/18 0958 04/24/18 1424 04/24/18 1605 04/25/18 0319  NA 135 134* 136 136  K 4.2 4.1 4.7 4.9  CL 102 99  --  108  CO2 20*   --   --  17*  GLUCOSE 121* 96  --  97  BUN 98* 111*  --  112*  CREATININE 6.28* 6.90*  --  7.31*  CALCIUM 7.5*  7.6*  --   --  7.6*   Recent Labs    04/24/18 0958  PTH 335*  Comment   Iron Studies: No results for input(s): IRON, TIBC, TRANSFERRIN, FERRITIN in the last 72 hours.  Studies/Results: Dg Chest Port 1 View  Result Date: 04/25/2018 CLINICAL DATA:  Respiratory EXAM: PORTABLE CHEST 1 VIEW COMPARISON:  04/24/2018 FINDINGS: Tracheostomy tube and dialysis catheter are again noted and stable. The lungs are well aerated bilaterally. No focal infiltrate or sizable effusion is seen. Cardiac shadow is stable. No bony abnormality is noted. IMPRESSION: No acute abnormality seen. Electronically Signed   By: Inez Catalina M.D.   On: 04/25/2018 09:40   Dg Chest Port 1 View  Result Date: 04/24/2018 CLINICAL DATA:  Bleeding from tracheostomy EXAM: PORTABLE CHEST 1 VIEW COMPARISON:  04/20/2018 FINDINGS: Dialysis catheter on the right with tip at the upper cavoatrial junction. Tracheostomy tube in place. Extensive artifact from overlying bedding or bandages. Chronic hyperinflation. Normal heart size. IMPRESSION: 1. Artifact from bandages or bedding. There is no definite lung opacity. 2. Tracheostomy tube is well seated. 3.  COPD. Electronically Signed   By: Monte Fantasia M.D.   On: 04/24/2018 14:26    I have reviewed the patient's current medications.  Assessment/Plan: 1 ESRD mod acidemia and azotemia from bleeding.  Vol and K ok. Will do HD in am , No hep 2 Anemia follow 3 VDRF per CCM 4 COPD 5 BOO  ? Needs IC 6 HPTH PTH ^, ^ phos P add binder, add vit D, HD in am.  Support with vent,     LOS: 1 day   Jeneen Rinks Marian Meneely 04/25/2018,1:33 PM

## 2018-04-25 NOTE — Progress Notes (Signed)
eLink Physician-Brief Progress Note Patient Name: HELTON OLESON DOB: 1956/01/25 MRN: [id number]   Date of Service  04/25/2018  HPI/Events of Note  Notified of anxiety and hypotension BP currently 86/48 Patient seen asleep  eICU Interventions  Ordered to give 500 CC NS bolus Ordered Fentanyl 12.5 mcg IV prn     Intervention Category Major Interventions: Hypotension - evaluation and management Minor Interventions: Agitation / anxiety - evaluation and management  Judd Lien 04/25/2018, 2:55 AM

## 2018-04-25 NOTE — Progress Notes (Addendum)
Patient appears to be anxious and mouthing that "he could not breath." Vitals are stable. Patient was suctioned and lung sounds were clear. Elink was notified. No new orders at this time. Will continue to monitor for any changes.

## 2018-04-25 NOTE — Progress Notes (Signed)
eLink Physician-Brief Progress Note Patient Name: Jimmy Martin DOB: Nov 15, 1955 MRN: [id number]   Date of Service  04/25/2018  HPI/Events of Note  Hgb drop from 7.4 to 6.9  eICU Interventions  Transfuse 1 unit pRBC Post-transfusion CBC     Intervention Category Major Interventions: Other:  Tatum Corl 04/25/2018, 11:19 PM

## 2018-04-25 NOTE — Progress Notes (Signed)
04/25/2018 12:33 PM  Grimmer, Jimmy Martin [id number]  Post-Op Day 1    Temp:  [98 F (36.7 C)-98.8 F (37.1 C)] 98.4 F (36.9 C) (11/28 1201) Pulse Rate:  [90-144] 106 (11/28 1200) Resp:  [0-32] 14 (11/28 1200) BP: (71-131)/(41-85) 110/55 (11/28 1200) SpO2:  [96 %-100 %] 99 % (11/28 1200) FiO2 (%):  [28 %-60 %] 30 % (11/28 1120) Weight:  [77 kg] 77 kg (11/27 1400),     Intake/Output Summary (Last 24 hours) at 04/25/2018 1233 Last data filed at 04/25/2018 1200 Gross per 24 hour  Intake 2940.2 ml  Output 50 ml  Net 2890.2 ml    Results for orders placed or performed during the hospital encounter of 04/24/18 (from the past 24 hour(s))  Type and screen     Status: None (Preliminary result)   Collection Time: 04/24/18  2:00 PM  Result Value Ref Range   ABO/RH(D) B POS    Antibody Screen NEG    Sample Expiration 04/27/2018    Unit Number [id number]    Blood Component Type RED CELLS,LR    Unit division 00    Status of Unit ISSUED,FINAL    Unit tag comment VERBAL ORDERS PER DR OACZY    Transfusion Status OK TO TRANSFUSE    Crossmatch Result COMPATIBLE    Unit Number [id number]    Blood Component Type RBC LR PHER1    Unit division 00    Status of Unit REL FROM [facility]    Unit tag comment VERBAL ORDERS PER DR TFTDD    Transfusion Status OK TO TRANSFUSE    Crossmatch Result COMPATIBLE    Unit Number [id number]    Blood Component Type RED CELLS,LR    Unit division 00    Status of Unit ALLOCATED    Transfusion Status OK TO TRANSFUSE    Crossmatch Result Compatible    Unit Number [id number]    Blood Component Type RED CELLS,LR    Unit division 00    Status of Unit ISSUED,FINAL    Transfusion Status OK TO TRANSFUSE    Crossmatch Result Compatible    Unit Number [id number]    Blood Component Type RED CELLS,LR    Unit division 00    Status of Unit ISSUED,FINAL    Transfusion Status OK TO TRANSFUSE    Crossmatch Result Compatible    Unit Number [id number]     Blood Component Type RED CELLS,LR    Unit division 00    Status of Unit ALLOCATED    Transfusion Status OK TO TRANSFUSE    Crossmatch Result Compatible    Unit Number [id number]    Blood Component Type RED CELLS,LR    Unit division 00    Status of Unit ALLOCATED    Transfusion Status OK TO TRANSFUSE    Crossmatch Result Compatible    Unit Number [id number]    Blood Component Type RED CELLS,LR    Unit division 00    Status of Unit ALLOCATED    Transfusion Status OK TO TRANSFUSE    Crossmatch Result Compatible   ABO/Rh     Status: None   Collection Time: 04/24/18  2:00 PM  Result Value Ref Range   ABO/RH(D)      B POS Performed at [facility], [place] [address], [place], [place] [address]   CBC with Differential     Status: Abnormal   Collection Time: 04/24/18  2:06 PM  Result Value Ref Range   WBC 13.5 (H) 4.0 -  10.5 K/uL   RBC 2.60 (L) 4.22 - 5.81 MIL/uL   Hemoglobin 7.5 (L) 13.0 - 17.0 g/dL   HCT 25.6 (L) 39.0 - 52.0 %   MCV 98.5 80.0 - 100.0 fL   MCH 28.8 26.0 - 34.0 pg   MCHC 29.3 (L) 30.0 - 36.0 g/dL   RDW 12.7 11.5 - 15.5 %   Platelets 112 (L) 150 - 400 K/uL   nRBC 0.0 0.0 - 0.2 %   Neutrophils Relative % 88 %   Neutro Abs 12.1 (H) 1.7 - 7.7 K/uL   Lymphocytes Relative 4 %   Lymphs Abs 0.5 (L) 0.7 - 4.0 K/uL   Monocytes Relative 6 %   Monocytes Absolute 0.8 0.1 - 1.0 K/uL   Eosinophils Relative 1 %   Eosinophils Absolute 0.1 0.0 - 0.5 K/uL   Basophils Relative 0 %   Basophils Absolute 0.0 0.0 - 0.1 K/uL   Immature Granulocytes 1 %   Abs Immature Granulocytes 0.08 (H) 0.00 - 0.07 K/uL  I-stat Chem 8, ED     Status: Abnormal   Collection Time: 04/24/18  2:24 PM  Result Value Ref Range   Sodium 134 (L) 135 - 145 mmol/L   Potassium 4.1 3.5 - 5.1 mmol/L   Chloride 99 98 - 111 mmol/L   BUN 111 (H) 6 - 20 mg/dL   Creatinine, Ser 6.90 (H) 0.61 - 1.24 mg/dL   Glucose, Bld 96 70 - 99 mg/dL   Calcium, Ion 1.03 (L) 1.15 - 1.40 mmol/L   TCO2 26 22 - 32  mmol/L   Hemoglobin 6.8 (LL) 13.0 - 17.0 g/dL   HCT 20.0 (L) 39.0 - 52.0 %   Comment NOTIFIED PHYSICIAN   Prepare RBC     Status: None   Collection Time: 04/24/18  2:32 PM  Result Value Ref Range   Order Confirmation      ORDER PROCESSED BY BLOOD BANK Performed at [facility], [address], [place], [place] [address]   POCT I-Stat EG7     Status: Abnormal   Collection Time: 04/24/18  4:05 PM  Result Value Ref Range   pH, Ven 7.120 (LL) 7.250 - 7.430   pCO2, Ven 69.5 (H) 44.0 - 60.0 mmHg   pO2, Ven 97.0 (H) 32.0 - 45.0 mmHg   Bicarbonate 22.4 20.0 - 28.0 mmol/L   TCO2 24 22 - 32 mmol/L   O2 Saturation 94.0 %   Acid-base deficit 7.0 (H) 0.0 - 2.0 mmol/L   Sodium 136 135 - 145 mmol/L   Potassium 4.7 3.5 - 5.1 mmol/L   Calcium, Ion 1.05 (L) 1.15 - 1.40 mmol/L   HCT 28.0 (L) 39.0 - 52.0 %   Hemoglobin 9.5 (L) 13.0 - 17.0 g/dL   Patient temperature 37.5 C    Sample type VENOUS    Comment NOTIFIED PHYSICIAN   Hemoglobin and hematocrit, blood     Status: Abnormal   Collection Time: 04/24/18  4:36 PM  Result Value Ref Range   Hemoglobin 9.8 (L) 13.0 - 17.0 g/dL   HCT 30.5 (L) 39.0 - 52.0 %  Protime-INR     Status: Abnormal   Collection Time: 04/24/18  4:36 PM  Result Value Ref Range   Prothrombin Time 16.6 (H) 11.4 - 15.2 seconds   INR 1.36   APTT     Status: None   Collection Time: 04/24/18  4:36 PM  Result Value Ref Range   aPTT 28 24 - 36 seconds  Hemoglobin  and hematocrit, blood     Status: Abnormal   Collection Time: 04/24/18  9:47 PM  Result Value Ref Range   Hemoglobin 8.3 (L) 13.0 - 17.0 g/dL   HCT 26.8 (L) 39.0 - 52.0 %  CBC     Status: Abnormal   Collection Time: 04/25/18  3:19 AM  Result Value Ref Range   WBC 17.4 (H) 4.0 - 10.5 K/uL   RBC 2.55 (L) 4.22 - 5.81 MIL/uL   Hemoglobin 7.6 (L) 13.0 - 17.0 g/dL   HCT 23.3 (L) 39.0 - 52.0 %   MCV 91.4 80.0 - 100.0 fL   MCH 29.8 26.0 - 34.0 pg   MCHC 32.6 30.0 - 36.0 g/dL   RDW 14.1 11.5 - 15.5 %    Platelets 75 (L) 150 - 400 K/uL   nRBC 0.1 0.0 - 0.2 %  Basic metabolic panel     Status: Abnormal   Collection Time: 04/25/18  3:19 AM  Result Value Ref Range   Sodium 136 135 - 145 mmol/L   Potassium 4.9 3.5 - 5.1 mmol/L   Chloride 108 98 - 111 mmol/L   CO2 17 (L) 22 - 32 mmol/L   Glucose, Bld 97 70 - 99 mg/dL   BUN 112 (H) 6 - 20 mg/dL   Creatinine, Ser 7.31 (H) 0.61 - 1.24 mg/dL   Calcium 7.6 (L) 8.9 - 10.3 mg/dL   GFR calc non Af Amer 7 (L) >60 mL/min   GFR calc Af Amer 9 (L) >60 mL/min   Anion gap 11 5 - 15  Magnesium     Status: Abnormal   Collection Time: 04/25/18  3:19 AM  Result Value Ref Range   Magnesium 2.5 (H) 1.7 - 2.4 mg/dL  Phosphorus     Status: Abnormal   Collection Time: 04/25/18  3:19 AM  Result Value Ref Range   Phosphorus 9.1 (H) 2.5 - 4.6 mg/dL  I-STAT 3, arterial blood gas (G3+)     Status: Abnormal   Collection Time: 04/25/18  3:35 AM  Result Value Ref Range   pH, Arterial 7.249 (L) 7.350 - 7.450   pCO2 arterial 46.2 32.0 - 48.0 mmHg   pO2, Arterial 208.0 (H) 83.0 - 108.0 mmHg   Bicarbonate 20.2 20.0 - 28.0 mmol/L   TCO2 22 22 - 32 mmol/L   O2 Saturation 100.0 %   Acid-base deficit 7.0 (H) 0.0 - 2.0 mmol/L   Patient temperature 98.6 F    Collection site RADIAL, ALLEN'S TEST ACCEPTABLE    Drawn by RT    Sample type ARTERIAL   Hemoglobin and hematocrit, blood     Status: Abnormal   Collection Time: 04/25/18  9:40 AM  Result Value Ref Range   Hemoglobin 7.4 (L) 13.0 - 17.0 g/dL   HCT 23.2 (L) 39.0 - 52.0 %    SUBJECTIVE:  No further bleeding per nursing staff.  OBJECTIVE:    Trach secure.    IMPRESSION:  Bleeding controlled.  PLAN:  May consider downsize and Evonnie Dawes valve use.  Long term disposition will need to be addressed.   Ileene Hutchinson [facility]

## 2018-04-25 NOTE — Plan of Care (Signed)
  Problem: Clinical Measurements: Goal: Ability to maintain clinical measurements within normal limits will improve Outcome: Progressing Goal: Respiratory complications will improve Outcome: Progressing Goal: Cardiovascular complication will be avoided Outcome: Progressing   Problem: Elimination: Goal: Will not experience complications related to urinary retention Outcome: Progressing   Problem: Safety: Goal: Ability to remain free from injury will improve Outcome: Progressing

## 2018-04-25 NOTE — Progress Notes (Signed)
CRITICAL VALUE ALERT  Critical Value:  Hemoglobin 6.9  Date & Time Notied: 04/25/18, 2309  Provider Notified: Dr. Jimmy Footman  Orders Received/Actions taken: Waiting for possible orders.

## 2018-04-25 NOTE — Progress Notes (Signed)
Pt hypotensive this morning 71'S - 92'T systolic. He is alert and oriented to person, place, situation and making frequent care requests. CCM MD made aware of pts hypotension. No new orders at this time. Will continue to monitor.

## 2018-04-25 NOTE — Consult Note (Addendum)
NAME:  Jimmy Martin, MRN:  [id number], DOB:  09/10/1955, LOS: 1 ADMISSION DATE:  04/24/2018, CONSULTATION DATE:  04/24/2018 REFERRING MD:  EDP Eulis Foster, CHIEF COMPLAINT:  Tracheostomy stoma bleeding   Brief History   62 year old male with PMH of laryngeal cancer who was sent intubated from [facility] hospital to [facility] where he failed extubation and ENT was called for a tracheostomy.  In [facility] the patient had an episode of tracheostomy stoma bleeding that seem to improve but subsequently had a coughing spell and very large volume arterial bleeding.  Patient is unable to provide history.   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   ... 11/27 Move to [place] -  tracheostomy bleed - to OR for control -s/o cautery of arterial hemorhage right paratrcheal region adjacnent to thyroid lobe. S./p replacement of #6 cuffed shiley  Consults:  PCCM  Procedures:  11/27 to the OR for massive bleed  Significant Diagnostic Tests:  11/27 OR for stabilization of bleed and biopsy  Micro Data:  N/A  Antimicrobials:  N/A   Interim history/subjective:    11/.28  -  No furhter bleeding. Mild hypotension overnight controlled with fluids. On precede gtt low dose - needing for calm (per RN - lot of opioids at [place]). Curently PRVC - meets SBT criteria. Co chronic pain.  WBC UP     Objective   Blood pressure (!) 93/48, pulse (!) 101, temperature 98 F (36.7 C), temperature source Oral, resp. rate 16, height 5\' 10"  (1.778 m), weight 77 kg, SpO2 99 %.    Vent Mode: PRVC FiO2 (%):  [28 %-60 %] 30 % Set Rate:  [16 bmp] 16 bmp Vt Set:  [550 mL]  550 mL PEEP:  [5 cmH20] 5 cmH20 Plateau Pressure:  [15 cmH20-19 cmH20] 15 cmH20   Intake/Output Summary (Last 24 hours) at 04/25/2018 1139 Last data filed at 04/25/2018 1100 Gross per 24 hour  Intake 2884.77 ml  Output 50 ml  Net 2834.77 ml   Filed Weights   04/24/18 1400  Weight: 77 kg    Examination: General Appearance:  Looks stable. Head:  Normocephalic, without obvious abnormality, atraumatic Eyes:  PERRL - yes, conjunctiva/corneas - clear     Ears:  Normal external ear canals, both ears Nose:  G tube - no Throat:  ETT TUBE - no , OG tube - no. TRACH +. No bled Neck:  Supple,  No enlargement/tenderness/nodules Lungs: Clear to auscultation bilaterally, Ventilator   Synchrony - yes on PRVC 40%. RT SUBCLAVIAN HD + - MILD REDNESS + Heart:  S1 and S2 normal, no murmur, CVP - no.  Pressors - no Abdomen:  distendd baseline - non specific tendernss +  Genitalia / Rectal:  Not done Extremities:  Extremities- intact. Moves all 4s Skin:  ntact in exposed areas .  Neurologic:  Sedation - precedex gtt -> RASS - +1 . Moves all 4s - yes. CAM-ICU - neg . Orientation - x3+      LABS    PULMONARY Recent Labs  Lab 04/20/18 0330  04/20/18 3664 04/21/18 2038 04/22/18 4034  04/22/18 0338 04/24/18 1424 04/24/18 1605 04/25/18 0335  PHART 7.215*  --  7.261* 7.428 7.352  --   --   --  7.249*  PCO2ART 52.2*  --  49.2* 37.4 45.1  --   --   --  46.2  PO2ART 342*  --  357.0* 155.0* 120.0*  --   --   --  208.0*  HCO3 20.3  --  22.1 24.7 25.0  --   --  22.4 20.2  TCO2  --    < > 24 26 26 25 26 24 22   O2SAT 98.8  --  100.0 99.0 98.0  --   --  94.0 100.0   < > = values in this interval not displayed.    CBC Recent Labs  Lab 04/24/18 0958 04/24/18 1406  04/24/18 2147 04/25/18 0319 04/25/18 0940  HGB 7.7* 7.5*   < > 8.3* 7.6* 7.4*  HCT 24.9* 25.6*   < > 26.8* 23.3* 23.2*  WBC 13.3* 13.5*  --   --  17.4*  --   PLT 105* 112*  --   --  75*  --    < > = values in this interval not  displayed.    COAGULATION Recent Labs  Lab 04/19/18 1937 04/23/18 0604 04/24/18 1636  INR 1.08 1.16 1.36    CARDIAC   Recent Labs  Lab 04/24/18 0958 04/24/18 1407  TROPONINI 0.36* 0.30*   No results for input(s): PROBNP in the last 168 hours.   CHEMISTRY Recent Labs  Lab 04/20/18 1649 04/21/18 0535 04/21/18 0749 04/21/18 1150 04/21/18 1823 04/22/18 0338 04/23/18 0604 04/24/18 0958 04/24/18 1424 04/24/18 1605 04/25/18 0319  NA  --   --  138 134*  --  133* 136 135 134* 136 136  K  --   --  6.5* 3.4*  --  4.7 3.7 4.2 4.1 4.7 4.9  CL  --   --  98 96*  --  99 102 102 99  --  108  CO2  --   --  17* 24  --   --  24 20*  --   --  17*  GLUCOSE  --   --  117* 102*  --  142* 115* 121* 96  --  97  BUN  --   --  232* 63*  --  109* 63* 98* 111*  --  112*  CREATININE  --   --  10.17* 3.74*  --  6.80* 4.13* 6.28* 6.90*  --  7.31*  CALCIUM  --   --  7.9* 8.0*  --   --  7.6* 7.5*  7.6*  --   --  7.6*  MG 3.4* 3.6*  --  2.3 2.5*  --   --   --   --   --  2.5*  PHOS 18.6* 19.1* 19.2*  --  9.1*  --   --  8.3*  --   --  9.1*   Estimated Creatinine Clearance: 11.1 mL/min (A) (by C-G formula based on SCr of 7.31 mg/dL (H)).   LIVER Recent Labs  Lab 04/19/18 1937 04/21/18 0749 04/23/18 0604 04/24/18 0958 04/24/18 1636  AST 34  --   --   --   --   ALT 81*  --   --   --   --   ALKPHOS 74  --   --   --   --   BILITOT 1.2  --   --   --   --  PROT 6.1*  --   --   --   --   ALBUMIN 2.6* 2.2*  --  1.7*  --   INR 1.08  --  1.16  --  1.36     INFECTIOUS Recent Labs  Lab 04/20/18 0306 04/20/18 0527  LATICACIDVEN 0.8 1.1     ENDOCRINE CBG (last 3)  Recent Labs    04/22/18 1508  GLUCAP 107*         IMAGING x48h  - image(s) personally visualized  -   highlighted in bold Dg Chest Port 1 View  Result Date: 04/25/2018 CLINICAL DATA:  Respiratory EXAM: PORTABLE CHEST 1 VIEW COMPARISON:  04/24/2018 FINDINGS: Tracheostomy tube and dialysis catheter are again noted  and stable. The lungs are well aerated bilaterally. No focal infiltrate or sizable effusion is seen. Cardiac shadow is stable. No bony abnormality is noted. IMPRESSION: No acute abnormality seen. Electronically Signed   By: Inez Catalina M.D.   On: 04/25/2018 09:40   Dg Chest Port 1 View  Result Date: 04/24/2018 CLINICAL DATA:  Bleeding from tracheostomy EXAM: PORTABLE CHEST 1 VIEW COMPARISON:  04/20/2018 FINDINGS: Dialysis catheter on the right with tip at the upper cavoatrial junction. Tracheostomy tube in place. Extensive artifact from overlying bedding or bandages. Chronic hyperinflation. Normal heart size. IMPRESSION: 1. Artifact from bandages or bedding. There is no definite lung opacity. 2. Tracheostomy tube is well seated. 3. COPD. Electronically Signed   By: Monte Fantasia M.D.   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  Patient was evaluated in the ER and all attempts to intubate from above have been unsuccessful in passing beyond the cord.  Bronchoscopically there was significant blood below the trach going into the airway but bleeding was from above.  Discussed with ENT - Dr. Wilburn Cornelia.  Tracheostomy bleed Massive  - resolved s/p cautery 04/24/18  - per ENT  Hemorrhagic shock: s/p PRBC Hemorrhagic anemia:  - rsolvd bleeding as of 04/25/18  -  - PRBC for hgb </= 6.9gm%    - exceptions are   -  if ACS susepcted/confirmed then transfuse for hgb </= 8.0gm%,  or    -  active bleeding with hemodynamic instability, then transfuse regardless of hemoglobin value   At at all times try to transfuse 1 unit prbc as possible with exception of active hemorrhage   Laryngeal cancer:   - Needs oncology to see  - Will need to discuss prognosis  ESRD: - baseline on PD. Changed to iHD in early Nov 2019 at [facility] with Rt  subclavian HD cath  -  Dr Pearson Grippe called (mentioned redness around HD cath  Acute respiratory failure: due to airway obstruction and tracheostomy bleed  -- do SBT but no decannulation  - PRVC overngith DM:  - CBGs  - ISS  Malnutrition  - Consult nutrition for TF as per nutrition  Sedation and chronic pain:   - PRN fentanyl   precedex gtt  PCCM primary. Exwife and step son updated   ATTESTATION & SIGNATURE   The patient Jimmy Martin is critically ill with multiple organ systems failure and requires high complexity decision making for assessment and support, frequent evaluation and titration of therapies, application of advanced monitoring technologies and extensive interpretation of multiple databases.   Critical Care Time  devoted to patient care services described in this note is  30  Minutes. This time reflects time of care of this signee Dr Brand Males. This critical care time does not reflect procedure time, or teaching time or supervisory time of PA/NP/Med student/Med Resident etc but could involve care discussion time     Dr. Brand Males, M.D., [facility].C.P Pulmonary and Critical Care Medicine Staff Physician [place] [facility] Pager: [phone], If no answer or between  15:00h - 7:00h: call [phone]  04/25/2018 11:39 AM

## 2018-04-26 ENCOUNTER — Encounter (HOSPITAL_COMMUNITY): Payer: Self-pay | Admitting: Otolaryngology

## 2018-04-26 ENCOUNTER — Other Ambulatory Visit (HOSPITAL_COMMUNITY): Payer: MEDICAID

## 2018-04-26 ENCOUNTER — Other Ambulatory Visit: Payer: Self-pay

## 2018-04-26 ENCOUNTER — Inpatient Hospital Stay
Admission: RE | Admit: 2018-04-26 | Discharge: 2018-06-12 | Disposition: A | Discharge status: Home / Self Care | Payer: MEDICAID | Attending: Internal Medicine | Admitting: Internal Medicine

## 2018-04-26 DIAGNOSIS — C329 Malignant neoplasm of larynx, unspecified: Secondary | ICD-10-CM

## 2018-04-26 DIAGNOSIS — N186 End stage renal disease: Secondary | ICD-10-CM

## 2018-04-26 DIAGNOSIS — Z931 Gastrostomy status: Secondary | ICD-10-CM

## 2018-04-26 DIAGNOSIS — R0602 Shortness of breath: Secondary | ICD-10-CM

## 2018-04-26 DIAGNOSIS — K567 Ileus, unspecified: Secondary | ICD-10-CM

## 2018-04-26 DIAGNOSIS — J9690 Respiratory failure, unspecified, unspecified whether with hypoxia or hypercapnia: Secondary | ICD-10-CM

## 2018-04-26 DIAGNOSIS — Z930 Tracheostomy status: Secondary | ICD-10-CM

## 2018-04-26 DIAGNOSIS — G9340 Encephalopathy, unspecified: Secondary | ICD-10-CM | POA: Diagnosis present

## 2018-04-26 DIAGNOSIS — Z992 Dependence on renal dialysis: Secondary | ICD-10-CM

## 2018-04-26 DIAGNOSIS — J9621 Acute and chronic respiratory failure with hypoxia: Secondary | ICD-10-CM

## 2018-04-26 DIAGNOSIS — Z9911 Dependence on respirator [ventilator] status: Secondary | ICD-10-CM

## 2018-04-26 DIAGNOSIS — L7682 Other postprocedural complications of skin and subcutaneous tissue: Secondary | ICD-10-CM | POA: Diagnosis present

## 2018-04-26 DIAGNOSIS — J189 Pneumonia, unspecified organism: Secondary | ICD-10-CM | POA: Diagnosis present

## 2018-04-26 LAB — CBC
HCT: 28.2 % — ABNORMAL LOW (ref 39.0–52.0)
Hemoglobin: 8.7 g/dL — ABNORMAL LOW (ref 13.0–17.0)
MCH: 29.4 pg (ref 26.0–34.0)
MCHC: 30.9 g/dL (ref 30.0–36.0)
MCV: 95.3 fL (ref 80.0–100.0)
Platelets: 131 10*3/uL — ABNORMAL LOW (ref 150–400)
RBC: 2.96 MIL/uL — ABNORMAL LOW (ref 4.22–5.81)
RDW: 15.8 % — ABNORMAL HIGH (ref 11.5–15.5)
WBC: 13.3 10*3/uL — ABNORMAL HIGH (ref 4.0–10.5)
nRBC: 0 % (ref 0.0–0.2)

## 2018-04-26 LAB — RENAL FUNCTION PANEL
Albumin: 1.9 g/dL — ABNORMAL LOW (ref 3.5–5.0)
Anion gap: 16 — ABNORMAL HIGH (ref 5–15)
BUN: 132 mg/dL — AB (ref 6–20)
CO2: 14 mmol/L — ABNORMAL LOW (ref 22–32)
CREATININE: 9.39 mg/dL — AB (ref 0.61–1.24)
Calcium: 8 mg/dL — ABNORMAL LOW (ref 8.9–10.3)
Chloride: 107 mmol/L (ref 98–111)
GFR calc Af Amer: 6 mL/min — ABNORMAL LOW (ref >60)
GFR calc non Af Amer: 5 mL/min — ABNORMAL LOW (ref >60)
Glucose, Bld: 83 mg/dL (ref 70–99)
Phosphorus: 11.3 mg/dL — ABNORMAL HIGH (ref 2.5–4.6)
Potassium: 5.1 mmol/L (ref 3.5–5.1)
Sodium: 137 mmol/L (ref 135–145)

## 2018-04-26 LAB — BLOOD GAS, ARTERIAL
ACID-BASE EXCESS: 0.7 mmol/L (ref 0.0–2.0)
Bicarbonate: 24.7 mmol/L (ref 20.0–28.0)
FIO2: 0.3
MECHVT: 550 mL
O2 Saturation: 94.3 %
PEEP/CPAP: 5 cmH2O
Patient temperature: 98.6
RATE: 16 resp/min
pCO2 arterial: 39.5 mmHg (ref 32.0–48.0)
pH, Arterial: 7.413 (ref 7.350–7.450)
pO2, Arterial: 68.3 mmHg — ABNORMAL LOW (ref 83.0–108.0)

## 2018-04-26 LAB — CULTURE, RESPIRATORY W GRAM STAIN: Culture: NORMAL

## 2018-04-26 LAB — PHOSPHORUS: Phosphorus: 4 mg/dL (ref 2.5–4.6)

## 2018-04-26 LAB — HEMOGLOBIN AND HEMATOCRIT, BLOOD
HCT: 24.7 % — ABNORMAL LOW (ref 39.0–52.0)
HCT: 23.5 % — ABNORMAL LOW (ref 39.0–52.0)
Hemoglobin: 8 g/dL — ABNORMAL LOW (ref 13.0–17.0)
Hemoglobin: 7.8 g/dL — ABNORMAL LOW (ref 13.0–17.0)

## 2018-04-26 LAB — MAGNESIUM
MAGNESIUM: 2 mg/dL (ref 1.7–2.4)
Magnesium: 2.7 mg/dL — ABNORMAL HIGH (ref 1.7–2.4)

## 2018-04-26 LAB — GLUCOSE, CAPILLARY: Glucose-Capillary: 102 mg/dL — ABNORMAL HIGH (ref 70–99)

## 2018-04-26 MED ORDER — HEPARIN SODIUM (PORCINE) 1000 UNIT/ML DIALYSIS: 1000.0000 [IU] | INTRAMUSCULAR | Status: DC | PRN

## 2018-04-26 MED ORDER — ARFORMOTEROL TARTRATE 15 MCG/2ML IN NEBU: 15.0000 ug | INHALATION_SOLUTION | Freq: Two times a day (BID) | RESPIRATORY_TRACT | Status: AC

## 2018-04-26 MED ORDER — HEPARIN SODIUM (PORCINE) 1000 UNIT/ML DIALYSIS: 1000.0000 [IU] | INTRAMUSCULAR | Status: AC | PRN

## 2018-04-26 MED ORDER — CLONAZEPAM 0.5 MG PO TBDP
0.5000 mg | ORAL_TABLET | Freq: Two times a day (BID) | ORAL | Status: DC
  Administered 2018-04-26: 0.5 mg
  Filled 2018-04-26: qty 1

## 2018-04-26 MED ORDER — DEXMEDETOMIDINE HCL IN NACL 200 MCG/50ML IV SOLN: 0.4000 ug/kg/h | INTRAVENOUS | Status: AC

## 2018-04-26 MED ORDER — BUDESONIDE 0.5 MG/2ML IN SUSP
0.5000 mg | Freq: Two times a day (BID) | RESPIRATORY_TRACT | Status: DC
  Administered 2018-04-26: 0.5 mg via RESPIRATORY_TRACT
  Filled 2018-04-26: qty 2

## 2018-04-26 MED ORDER — PRO-STAT SUGAR FREE PO LIQD: 60.0000 mL | Freq: Two times a day (BID) | ORAL | Status: DC

## 2018-04-26 MED ORDER — IOPAMIDOL (ISOVUE-300) INJECTION 61%
INTRAVENOUS | Status: AC
  Administered 2018-04-26: 50 mL via GASTROSTOMY
  Filled 2018-04-26: qty 50

## 2018-04-26 MED ORDER — SODIUM CHLORIDE 0.9 % IV SOLN: 100.0000 mL | INTRAVENOUS | Status: DC | PRN

## 2018-04-26 MED ORDER — LIDOCAINE HCL (PF) 1 % IJ SOLN: 5.0000 mL | INTRAMUSCULAR | Status: DC | PRN

## 2018-04-26 MED ORDER — PENTAFLUOROPROP-TETRAFLUOROETH EX AERO: 1.0000 "application " | INHALATION_SPRAY | CUTANEOUS | Status: DC | PRN

## 2018-04-26 MED ORDER — ALTEPLASE 2 MG IJ SOLR: 2.0000 mg | Freq: Once | INTRAMUSCULAR | Status: DC | PRN

## 2018-04-26 MED ORDER — SEVELAMER CARBONATE 2.4 G PO PACK: 2.4000 g | PACK | Freq: Three times a day (TID) | ORAL | Status: AC

## 2018-04-26 MED ORDER — ARFORMOTEROL TARTRATE 15 MCG/2ML IN NEBU
15.0000 ug | INHALATION_SOLUTION | Freq: Two times a day (BID) | RESPIRATORY_TRACT | Status: DC
  Administered 2018-04-26: 15 ug via RESPIRATORY_TRACT
  Filled 2018-04-26: qty 2

## 2018-04-26 MED ORDER — PRO-STAT SUGAR FREE PO LIQD
30.0000 mL | Freq: Two times a day (BID) | ORAL | Status: DC
  Filled 2018-04-26: qty 30

## 2018-04-26 MED ORDER — PRO-STAT SUGAR FREE PO LIQD: 60.0000 mL | Freq: Two times a day (BID) | ORAL | 0 refills | Status: AC

## 2018-04-26 MED ORDER — LIDOCAINE-PRILOCAINE 2.5-2.5 % EX CREA: 1.0000 "application " | TOPICAL_CREAM | CUTANEOUS | Status: DC | PRN

## 2018-04-26 MED ORDER — FAMOTIDINE IN NACL 20-0.9 MG/50ML-% IV SOLN: 20.0000 mg | INTRAVENOUS | Status: AC

## 2018-04-26 MED ORDER — PENTAFLUOROPROP-TETRAFLUOROETH EX AERO: 1.0000 "application " | INHALATION_SPRAY | CUTANEOUS | 0 refills | Status: AC | PRN

## 2018-04-26 MED ORDER — FENTANYL CITRATE (PF) 100 MCG/2ML IJ SOLN: 12.5000 ug | Freq: Four times a day (QID) | INTRAMUSCULAR | 0 refills | Status: AC | PRN

## 2018-04-26 MED ORDER — VITAL HIGH PROTEIN PO LIQD: 1000.0000 mL | ORAL | Status: DC

## 2018-04-26 MED ORDER — CLONAZEPAM 0.5 MG PO TBDP: 0.5000 mg | ORAL_TABLET | Freq: Two times a day (BID) | ORAL | 0 refills | Status: AC

## 2018-04-26 MED ORDER — NEPRO/CARBSTEADY PO LIQD
1000.0000 mL | ORAL | Status: DC
  Administered 2018-04-26: 1000 mL via ORAL
  Filled 2018-04-26: qty 1000

## 2018-04-26 MED ORDER — BUDESONIDE 0.5 MG/2ML IN SUSP: 0.5000 mg | Freq: Two times a day (BID) | RESPIRATORY_TRACT | 12 refills | Status: AC

## 2018-04-26 MED ORDER — NEPRO/CARBSTEADY PO LIQD: 1000.0000 mL | ORAL | 0 refills | Status: AC

## 2018-04-26 NOTE — Progress Notes (Signed)
Transferred to [facility] Room 13 via bed. Report given to Kathlee Nations, RN  2 cell phones and 1 charger and 2 pair of glasses transferred with patient.

## 2018-04-26 NOTE — Progress Notes (Signed)
RT NOTE: RT transported pt to [facility] with RN.

## 2018-04-26 NOTE — Progress Notes (Signed)
Initial Nutrition Assessment  DOCUMENTATION CODES:   Not applicable  INTERVENTION:  - Will order Nepro @ 35 mL/hr with 60 mL Prostat BID. This regimen will provide 1912 kcal (104% estimated kcal need), 128 grams of protein, and 611 mL free water. - Free water flush, if desired, to be per CCM.   NUTRITION DIAGNOSIS:   Increased nutrient needs related to chronic illness, cancer and cancer related treatments, wound healing, other (see comment)(ESRD) as evidenced by estimated needs.  GOAL:   Patient will meet greater than or equal to 90% of their needs  MONITOR:   Vent status, TF tolerance, Weight trends, Labs, Skin  REASON FOR ASSESSMENT:   Ventilator, Consult Enteral/tube feeding initiation and management  ASSESSMENT:   62 year old male with PMH of laryngeal cancer s/p radiation, ESRD previously on PD. He was sent on the vent to [facility] from [facility]. He failed extubated and ENT was called for a tracheostomy. At [facility] he had an episode of tracheostomy stoma bleeding that seemed to improve but subsequently had a coughing spell and very large volume arterial bleeding. Patient transferred to [facility] at that time.   Significant Events: 11/9- admitted to [facility] for HCAP/COPD 11/18- extubated 11/21 - PEG tube and new HD cath placed 11/22 - dc'd to [facility] 11/22 - last HD--per notes pt was "transitioned from PD to HD"  11/27- transferred to [facility]-  tracheostomy bleed--to OR for control--s/p cautery of arterial hemorhage right paratrcheal region. S/p replacement of trach   BMI indicates borderline overweight status. Patient intubated via trach with PEG in place. No family/visitors present at this time and patient sleeping, even during NFPE. Plan for HD today (11/29). Plan for SLP consult for PMV trials once patient extubated.    Patient is currently intubated on ventilator support MV: 11.5 L/min Temp (24hrs), Avg:98.2 F (36.8  C), Min:97.6 F (36.4 C), Max:98.4 F (36.9 C) Propofol: none BP: 124/66 and MAP: 78  Medications reviewed; 20 mg IV Pepcid/day, 2.4 g Renvela via PEG/day. Labs reviewed; BUN: 112 mg/dL, creatinine: 7.31 mg/dL, Ca: 7.6 mg/dL, Phos: 9.1 mg/dL, Mg: 2.5 mg/dL, GFR: 7 mL/min.   Drip; Precedex @ 0.3 mcg/kg/hr.   NUTRITION - FOCUSED PHYSICAL EXAM:   Completed; no muscle and no fat wasting, no edema noted at this time.  Diet Order:   Diet Order    None      EDUCATION NEEDS:   Not appropriate for education at this time  Skin:  Skin Assessment: Skin Integrity Issues: Skin Integrity Issues:: Stage II Stage II: bilateral buttocks, coccyx  Last BM:  PTA/unknown  Height:   Ht Readings from Last 1 Encounters:  04/24/18 5\' 10"  (1.778 m)    Weight:   Wt Readings from Last 1 Encounters:  04/26/18 79 kg    Ideal Body Weight:  75.45 kg  BMI:  Body mass index is 24.99 kg/m.  Estimated Nutritional Needs:   Kcal:  1833 kcal  Protein:  123-139 grams (1.6-1.8 grams/kg)  Fluid:  >/= 1.7 L/day     Jarome Matin, MS, RD, LDN, [facility] Inpatient Clinical Dietitian Pager # [phone] After hours/weekend pager # [phone]

## 2018-04-26 NOTE — Procedures (Signed)
I was present at this dialysis session. I have reviewed the session itself and made appropriate changes.   1L UF. TDC Qb 400 AP ok.  No heparin.  BP stable. 2K bath.   Filed Weights   04/24/18 1400 04/26/18 0954  Weight: 77 kg 79 kg    Recent Labs  Lab 04/26/18 1033  NA 137  K 5.1  CL 107  CO2 14*  GLUCOSE 83  BUN 132*  CREATININE 9.39*  CALCIUM 8.0*  PHOS 11.3*    Recent Labs  Lab 04/19/18 1937  04/24/18 1406  04/25/18 0319  04/25/18 1552 04/25/18 2249 04/26/18 0457  WBC 23.9*   < > 13.5*  --  17.4*  --   --   --  13.3*  NEUTROABS 21.3*  --  12.1*  --   --   --   --   --   --   HGB 12.6*   < > 7.5*   < > 7.6*   < > 7.4* 6.9* 8.7*  8.0*  HCT 40.0   < > 25.6*   < > 23.3*   < > 22.1* 21.1* 28.2*  24.7*  MCV 92.4   < > 98.5  --  91.4  --   --   --  95.3  PLT 200   < > 112*  --  75*  --   --   --  131*   < > = values in this interval not displayed.    Scheduled Meds: . arformoterol  15 mcg Nebulization BID  . budesonide (PULMICORT) nebulizer solution  0.5 mg Nebulization BID  . chlorhexidine gluconate (MEDLINE KIT)  15 mL Mouth Rinse BID  . Chlorhexidine Gluconate Cloth  6 each Topical Q0600  . clonazepam  0.5 mg Per Tube BID  . feeding supplement (NEPRO CARB STEADY)  1,000 mL Oral Q24H  . feeding supplement (PRO-STAT SUGAR FREE 64)  60 mL Per Tube BID  . mouth rinse  15 mL Mouth Rinse 10 times per day  . sevelamer carbonate  2.4 g Per Tube TID   Continuous Infusions: . sodium chloride    . sodium chloride    . dexmedetomidine (PRECEDEX) IV infusion 0.3 mcg/kg/hr (04/26/18 1000)  . famotidine (PEPCID) IV 20 mg (04/26/18 1122)   PRN Meds:.sodium chloride, sodium chloride, alteplase, fentaNYL (SUBLIMAZE) injection, heparin, levalbuterol, lidocaine (PF), lidocaine-prilocaine, pentafluoroprop-tetrafluoroeth   Pearson Grippe  MD 04/26/2018, 11:59 AM

## 2018-04-26 NOTE — Care Management Note (Signed)
Case Management Note  Patient Details  Name: Jimmy Martin MRN: [id number] Date of Birth: 1956/05/17  Subjective/Objective: s/p Tracheostomy stoma bleeding                    Action/Plan: CM received incoming call from [name], [place] discuss POC; patient is able to return back and the facility, with a bed available today, and to discuss with the provider if patient can receive HD prior to transferring back to the facility; DC order, med rec and summary needed. CM spoke to Marni Griffon, NP with POC discussed, with patient to return back to [facility] today. No further needs from CM.  Expected Discharge Date:                  Expected Discharge Plan:  Long Term Acute Care (LTAC)([facility])  In-House Referral:  NA  Discharge planning Services  CM Consult  Post Acute Care Choice:  Resumption of Svcs/PTA Provider Choice offered to:  NA  DME Arranged:  N/A DME Agency:  NA  HH Arranged:  NA HH Agency:  NA  Status of Service:  Completed, signed off  If discussed at [place] of Stay Meetings, dates discussed:    Additional Comments:  Midge Minium RN, BSN, NCM-BC, ACM-RN [phone] 04/26/2018, 10:01 AM

## 2018-04-26 NOTE — Discharge Summary (Signed)
Physician Discharge Summary         Patient ID: Jimmy Martin MRN: [id number] DOB/AGE: August 30, 1955 62 y.o.  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  Ostomy was performed by ENT, patient was weaning and participating in rehabilitation efforts at [facility], on the 27th had a coughing episode then apparent subsequent arterial bleed from the right peritracheal region.  He was brought to the emergency room where he required cauterization to control bleeding, he was admitted to the intensive care postoperative on critical care [facility] course/highlights 11/9 - Admit [facility] for Hcap/ COPD and resp failure, presented SOB 11/18 - extubated 11/21 - PEG tube and new HD cath placed 11/22 - dc'd to [place] 11/22 - last HD 11/22/ Also per notes pt was "transitioned from PD to hemodialysis" while admitted there. 11/27 Move to [place] -  tracheostomy bleed - to OR for control -s/o cautery of arterial hemorhage right paratrcheal region adjacnent to thyroid lobe. S./p replacement of #6 cuffed shiley 11/28: mild hypotension over night. Responded to fluids. Sedated on precedex, bleeding controlled 11/29: Hemoglobin drifting down from 7.4-6.9 he received 1 unit of blood for this.  Adding low-dose clonazepam for anxiety with goal to get him off Precedex infusion.  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  History of COPD -Does have purulent foul-smelling sputum, most recent cultures showed normal flora Chest x-ray personally reviewed: Demonstrates no focal infiltrates no acute processes Tolerating pressure support ventilation Plan Continue pressure support as tolerated with daily attempts at aerosol trach collar Scheduled bronchodilators Repeating chest x-ray in a.m. 11/30 Repeat sputum culture, trend fever curve, repeat CBC in a.m. PAD protocol RASS goal 0 hope to discontinue Precedex Mobilize  ESRD: - baseline on PD. Changed to iHD in early Nov 2019 at [facility] with Rt subclavian HD cath Plan For dialysis today Follow-up a.m. Chemistries I&O cath X 1  Anemia of critical illness, following acute blood loss anemia/hemorrhage Plan Trend CBC Holding anticoagulation   Malnutrition Plan start tube feeds   Chronic pain Plan PRN fentanyl  Anxiety Plan Adding scheduled clonazepam, goal to discontinue [facility] tests/ studies   Procedures   Cauterization in OR on 11/27 Micro Data:  11/27: Sputum culture normal flora 11/29 sputum culture  Antimicrobials:  N/A    Consults  ENT 11/27  Discharge Exam: Blood Pressure 126/73   Pulse (Abnormal) 103   Temperature 98.2 F (36.8 C) (Oral)   Respiration 18   Height '5\' 10"'  (1.778 m)   Weight 79 kg   Oxygen Saturation 99%   Body Mass Index 24.99 kg/m   General 62 year old male patient currently on pressure support ventilation HEENT normocephalic atraumatic no jugular venous distention tracheostomy site unremarkable but does have foul-smelling purulent  discharge Pulmonary occasional rhonchi, excellent  tidal volume on pressure support ventilation Abdomen: Soft nontender no organomegaly Extremities warm dry Neuro awake oriented follows commands Labs at discharge   Lab Results  Component Value Date   CREATININE 9.39 (H) 04/26/2018   BUN 132 (H) 04/26/2018   NA 137 04/26/2018   K 5.1 04/26/2018   CL 107 04/26/2018   CO2 14 (L) 04/26/2018   Lab Results  Component Value Date   WBC 13.3 (H) 04/26/2018   HGB 8.0 (L) 04/26/2018   HGB 8.7 (L) 04/26/2018   HCT 24.7 (L) 04/26/2018   HCT 28.2 (L) 04/26/2018   MCV 95.3 04/26/2018   PLT 131 (L) 04/26/2018   Lab Results  Component Value Date   ALT 81 (H) 04/19/2018   AST 34 04/19/2018   ALKPHOS 74 04/19/2018   BILITOT 1.2 04/19/2018   Lab Results  Component Value Date   INR 1.36 04/24/2018   INR 1.16 04/23/2018   INR 1.08 04/19/2018    Current radiological studies    Dg Chest Port 1 View  Result Date: 04/25/2018 CLINICAL DATA:  Respiratory EXAM: PORTABLE CHEST 1 VIEW COMPARISON:  04/24/2018 FINDINGS: Tracheostomy tube and dialysis catheter are again noted and stable. The lungs are well aerated bilaterally. No focal infiltrate or sizable effusion is seen. Cardiac shadow is stable. No bony abnormality is noted. IMPRESSION: No acute abnormality seen. Electronically Signed   By: Inez Catalina M.D.   On: 04/25/2018 09:40   Dg Chest Port 1 View  Result Date: 04/24/2018 CLINICAL DATA:  Bleeding from tracheostomy EXAM: PORTABLE CHEST 1 VIEW COMPARISON:  04/20/2018 FINDINGS: Dialysis catheter on the right with tip at the upper cavoatrial junction. Tracheostomy tube in place. Extensive artifact from overlying bedding or bandages. Chronic hyperinflation. Normal heart size. IMPRESSION: 1. Artifact from bandages or bedding. There is no definite lung opacity. 2. Tracheostomy tube is well seated. 3. COPD. Electronically Signed   By: Monte Fantasia M.D.   On: 04/24/2018 14:26    Disposition:    Discharge disposition: 02-Transferred to [facility]       Discharge Instructions    Increase activity slowly   Complete by:  As directed       Allergies as of 04/26/2018    Allergen Reactions Comment   Acetaminophen Rash "high heart rate"   Tea     Bactrim [sulfamethoxazole-trimethoprim] Rash    Ceftriaxone Itching, Rash Association NOT definite; no urticaria or anaphylaxis; just "little red bumps" and itching   Sulfa Antibiotics Rash       Medication List    Stop taking these medications   calcitRIOL 0.25 MCG capsule Commonly known as:  ROCALTROL   calcium acetate 667 MG capsule Commonly known as:  PHOSLO   ceFEPIme 1 g in sodium chloride 0.9 % 100 mL   chlorhexidine gluconate (MEDLINE KIT) 0.12 % solution Commonly known as:  PERIDEX   Chlorhexidine Gluconate Cloth 2 % Pads   ipratropium-albuterol 0.5-2.5 (3) MG/3ML Soln Commonly known as:  DUONEB   mouth rinse Liqd solution   nicotine 21 mg/24hr patch Commonly known as:  NICODERM CQ - dosed in mg/24 hours   sodium chloride flush 0.9 % Soln Commonly known as:  NS     Take these medications   arformoterol 15 MCG/2ML Nebu Commonly known as:  BROVANA Take 2 mLs (15 mcg total) by nebulization 2 (two) times daily.   budesonide 0.5 MG/2ML nebulizer solution Commonly known as:  PULMICORT Take 2 mLs (  0.5 mg total) by nebulization 2 (two) times daily.   clonazePAM 0.5 MG disintegrating tablet Commonly known as:  KLONOPIN Place 1 tablet (0.5 mg total) into feeding tube 2 (two) times daily.   dexmedetomidine 200 MCG/50ML Soln Commonly known as:  PRECEDEX Inject 30.8-92.4 mcg/hr into the vein continuous.   famotidine 20-0.9 MG/50ML-% Commonly known as:  PEPCID Inject 50 mLs (20 mg total) into the vein daily. Start taking on:  04/27/2018   feeding supplement (NEPRO CARB STEADY) Liqd Take 1,000 mLs by mouth daily. Start taking on:  04/27/2018   feeding supplement (PRO-STAT SUGAR FREE 64) Liqd Place 60 mLs into feeding tube 2 (two) times  daily. What changed:  when to take this   fentaNYL 100 MCG/2ML injection Commonly known as:  SUBLIMAZE Inject 0.25 mLs (12.5 mcg total) into the vein every 6 (six) hours as needed for severe pain. What changed:    how much to take  when to take this  reasons to take this   heparin 1000 unit/mL Soln injection 1 mL (1,000 Units total) by Dialysis route as needed (in dialysis). What changed:    how much to take  Another medication with the same name was removed. Continue taking this medication, and follow the directions you see here.   pentafluoroprop-tetrafluoroeth Aero Commonly known as:  GEBAUERS Apply 1 application topically as needed (topical anesthesia for hemodialysis).   sevelamer carbonate 2.4 g Pack Commonly known as:  RENVELA Place 2.4 g into feeding tube 3 (three) times daily. What changed:    medication strength  how much to take  how to take this  when to take this        Follow-up appointment   na Discharge Condition:    good  Physician Statement:   The Patient was personally examined, the discharge assessment and plan has been personally reviewed and I agree with ACNP Donise Woodle's assessment and plan. 32 minutes of time have been dedicated to discharge assessment, planning and discharge instructions.   Signed: Clementeen Graham 04/26/2018, 2:14 PM

## 2018-04-26 NOTE — Progress Notes (Signed)
NAME:  Jimmy Martin, MRN:  [id number], DOB:  1956-03-10, LOS: 2 ADMISSION DATE:  04/24/2018, CONSULTATION DATE:  04/24/2018 REFERRING MD:  EDP Eulis Foster, CHIEF COMPLAINT:  Tracheostomy stoma bleeding   Brief History   62 year old male with PMH of laryngeal cancer who was sent intubated from [facility] hospital to [facility] where he failed extubation and ENT was called for a tracheostomy.  In [facility] the patient had an episode of tracheostomy stoma bleeding that seem to improve but subsequently had a coughing spell and very large volume arterial bleeding.  Patient is unable to provide history.   Past Medical History  Laryngeal cancer - Squamous, Tracheostomy status, COPD, ESRD, HTN  Significant Hospital Events   11/9 -  Admit [facility] for Hcap/ COPD and resp failure, presented SOB  11/18 - extubated  11/21 - PEG tube and new HD cath placed  11/22 - dc'd to [place]  11/22 - last HD 11/22/ Also per notes pt was "transitioned from PD to hemodialysis" while admitted there.   11/27 Move to [place] -  tracheostomy bleed - to OR for control -s/o cautery of arterial hemorhage right paratrcheal region adjacnent to thyroid lobe. S./p replacement of #6 cuffed shiley 11/28: mild hypotension over night. Responded to fluids. Sedated on precedex, bleeding controlled 11/29: Hemoglobin drifting down from 7.4-6.9 he received 1 unit of blood for this.  Adding low-dose clonazepam for anxiety with goal to get him off Precedex infusion.  Sending sputum culture for foul-smelling sputum  Consults:  PCCM  Procedures:  11/27 to the OR for massive bleed  Significant Diagnostic Tests:  11/27 OR for stabilization of bleed and biopsy  Micro Data:  11/27: Sputum culture normal flora 11/29 sputum culture  Antimicrobials:  N/A   Interim history/subjective:    Objective   Blood pressure 121/75, pulse (Abnormal) 103, temperature 98.2 F (36.8 C), temperature source Oral, resp. rate 17, height 5\' 10"   (1.778 m), weight 77 kg, SpO2 100 %.    Vent Mode: CPAP;PSV FiO2 (%):  [30 %] 30 % Set Rate:  [15 bmp-16 bmp] 15 bmp Vt Set:  [550 mL] 550 mL PEEP:  [5 cmH20] 5 cmH20 Pressure Support:  [12 cmH20] 12 cmH20 Plateau Pressure:  [15 cmH20-18 cmH20] 18 cmH20   Intake/Output Summary (Last 24 hours) at 04/26/2018 0835 Last data filed at 04/26/2018 0600 Gross per 24 hour  Intake 888.41 ml  Output no documentation  Net 888.41 ml   Filed Weights   04/24/18 1400  Weight: 77 kg   General: This is 62 year old white male currently resting on pressure support ventilation he appears to be in no acute distress HEENT normocephalic atraumatic no jugular venous distention tracheostomy site is unremarkable does have purulent mucus. Pulmonary: Occasional rhonchi no accessory use tidal volumes in the 300s on pressor support of 5 Cardiac: Regular rate and rhythm without murmur rub or gallop Abdomen: Soft nontender no organomegaly GU: 300 cc residual bladder currently getting I&O Extremities: Warm and dry Neuro: Awake, follows commands, anxious at times, asking to have his hand held  Resolved Hospital Problem list   Massive tracheostomy bleed resolved status post cauterization on 11/27 Hemorrhagic shock secondary to arterial bleed adjacent to tracheostomy site Acute hypoxic respiratory failure secondary to airway obstruction due to massive airway bleeding Assessment & Plan:   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  History of COPD -Does have purulent foul-smelling sputum, most recent cultures showed normal flora Chest x-ray personally reviewed: Demonstrates no focal infiltrates no acute processes Tolerating pressure support ventilation Plan Continue pressure support as tolerated with daily attempts at  aerosol trach collar Scheduled bronchodilators Repeating chest x-ray in a.m. 11/30 Repeat sputum culture, trend fever curve, repeat CBC in a.m. PAD protocol RASS goal 0 hope to discontinue Precedex Mobilize  ESRD: - baseline on PD. Changed to iHD in early Nov 2019 at [facility] with Rt subclavian HD cath Plan For dialysis today Follow-up a.m. Chemistries I&O cath X 1  Anemia of critical illness, following acute blood loss anemia/hemorrhage Plan Trend CBC Holding anticoagulation   Malnutrition Plan start tube feeds   Chronic pain Plan PRN fentanyl  Anxiety Plan Adding scheduled clonazepam, goal to discontinue Precedex  Best practice    Diet: tubefeeds Sedation protocol 11/27 Glycemic control: ssi 11/27 VAP protocol 11/27 DVT: SCD SUP H2B Code status: full code Disposition: I think he could be transferred back to [place] once bed available we will continue supportive care  Erick Colace ACNP-BC [place] Pager # [phone] OR # [phone] if no answer    04/26/2018 8:35 AM

## 2018-04-27 DIAGNOSIS — Z8521 Personal history of malignant neoplasm of larynx: Secondary | ICD-10-CM

## 2018-04-27 DIAGNOSIS — J189 Pneumonia, unspecified organism: Secondary | ICD-10-CM

## 2018-04-27 DIAGNOSIS — G9340 Encephalopathy, unspecified: Secondary | ICD-10-CM

## 2018-04-27 DIAGNOSIS — L7682 Other postprocedural complications of skin and subcutaneous tissue: Secondary | ICD-10-CM

## 2018-04-27 DIAGNOSIS — Z992 Dependence on renal dialysis: Secondary | ICD-10-CM

## 2018-04-27 DIAGNOSIS — N186 End stage renal disease: Secondary | ICD-10-CM

## 2018-04-27 DIAGNOSIS — J9621 Acute and chronic respiratory failure with hypoxia: Secondary | ICD-10-CM

## 2018-04-27 DIAGNOSIS — Z930 Tracheostomy status: Secondary | ICD-10-CM

## 2018-04-27 LAB — CBC
HCT: 21.6 % — ABNORMAL LOW (ref 39.0–52.0)
Hemoglobin: 7.1 g/dL — ABNORMAL LOW (ref 13.0–17.0)
MCH: 30.1 pg (ref 26.0–34.0)
MCHC: 32.9 g/dL (ref 30.0–36.0)
MCV: 91.5 fL (ref 80.0–100.0)
PLATELETS: 137 10*3/uL — AB (ref 150–400)
RBC: 2.36 MIL/uL — ABNORMAL LOW (ref 4.22–5.81)
RDW: 14.2 % (ref 11.5–15.5)
WBC: 7.8 10*3/uL (ref 4.0–10.5)
nRBC: 0 % (ref 0.0–0.2)

## 2018-04-27 NOTE — Progress Notes (Signed)
Pulmonary Critical Care Medicine [place]   PULMONARY CRITICAL CARE SERVICE  PROGRESS NOTE  Date of Service: 04/27/2018  Jimmy Martin  NOM:[id number]  DOB: January 06, 1956   DOA: 04/26/2018  Referring Physician: Merton Border, MD  HPI: Jimmy Martin is a 62 y.o. male seen for follow up of Acute on Chronic Respiratory Failure.  Patient had been transferred to the acute side because of an acute bleeding episode from the tracheostomy tube.  Patient was admitted for evaluation.  Appears that patient had a large volume of arterial bleeding and this was evaluated and treated by the critical care team as well as evaluation by ED.  Right now appears to still have some oozing he remains on full vent control at this time is on assist control with an FiO2 of 30%  Medications: Reviewed on Rounds  Physical Exam:  Vitals: Temperature 98.7 pulse 97 respiratory 24 blood pressure 121/62 saturations 98%  Ventilator Settings mode of ventilation assist control FiO2 30% tidal volume 458 PEEP 5  . General: Comfortable at this time . Eyes: Grossly normal lids, irises & conjunctiva . ENT: grossly tongue is normal . Neck: no obvious mass . Cardiovascular: S1 S2 normal no gallop . Respiratory: Coarse rhonchi bilaterally . Abdomen: soft . Skin: no rash seen on limited exam . Musculoskeletal: not rigid . Psychiatric:unable to assess . Neurologic: no seizure no involuntary movements         Lab Data:   Basic Metabolic Panel: Recent Labs  Lab 04/21/18 1150 04/21/18 1823  04/23/18 0604 04/24/18 0958 04/24/18 1424 04/24/18 1605 04/25/18 0319 04/26/18 1033 04/26/18 1540  NA 134*  --    < > 136 135 134* 136 136 137  --   K 3.4*  --    < > 3.7 4.2 4.1 4.7 4.9 5.1  --   CL 96*  --    < > 102 102 99  --  108 107  --   CO2 24  --   --  24 20*  --   --  17* 14*  --   GLUCOSE 102*  --    < > 115* 121* 96  --  97 83  --   BUN 63*  --    < > 63* 98* 111*  --  112* 132*  --    CREATININE 3.74*  --    < > 4.13* 6.28* 6.90*  --  7.31* 9.39*  --   CALCIUM 8.0*  --   --  7.6* 7.5*  7.6*  --   --  7.6* 8.0*  --   MG 2.3 2.5*  --   --   --   --   --  2.5* 2.7* 2.0  PHOS  --  9.1*  --   --  8.3*  --   --  9.1* 11.3* 4.0   < > = values in this interval not displayed.    ABG: Recent Labs  Lab 04/21/18 2038 04/22/18 0332 04/24/18 1605 04/25/18 0335 04/26/18 2134  PHART 7.428 7.352  --  7.249* 7.413  PCO2ART 37.4 45.1  --  46.2 39.5  PO2ART 155.0* 120.0*  --  208.0* 68.3*  HCO3 24.7 25.0 22.4 20.2 24.7  O2SAT 99.0 98.0 94.0 100.0 94.3    Liver Function Tests: Recent Labs  Lab 04/21/18 0749 04/24/18 0958 04/26/18 1033  ALBUMIN 2.2* 1.7* 1.9*   No results for input(s): LIPASE, AMYLASE in the last 168 hours. No results for input(s):  AMMONIA in the last 168 hours.  CBC: Recent Labs  Lab 04/23/18 0604 04/24/18 0958 04/24/18 1406  04/25/18 0319 04/25/18 0940 04/25/18 1552 04/25/18 2249 04/26/18 0457 04/26/18 1540  WBC 14.5* 13.3* 13.5*  --  17.4*  --   --   --  13.3*  --   NEUTROABS  --   --  12.1*  --   --   --   --   --   --   --   HGB 7.8* 7.7* 7.5*   < > 7.6* 7.4* 7.4* 6.9* 8.7*  8.0* 7.8*  HCT 25.0* 24.9* 25.6*   < > 23.3* 23.2* 22.1* 21.1* 28.2*  24.7* 23.5*  MCV 94.7 94.7 98.5  --  91.4  --   --   --  95.3  --   PLT 101* 105* 112*  --  75*  --   --   --  131*  --    < > = values in this interval not displayed.    Cardiac Enzymes: Recent Labs  Lab 04/24/18 0958 04/24/18 1407  CKTOTAL 137  --   CKMB 6.1*  --   TROPONINI 0.36* 0.30*    BNP (last 3 results) No results for input(s): BNP in the last 8760 hours.  ProBNP (last 3 results) No results for input(s): PROBNP in the last 8760 hours.  Radiological Exams: Dg Abdomen Peg Tube Location  Result Date: 04/26/2018 CLINICAL DATA:  Initial evaluation for PEG tube placement EXAM: ABDOMEN - 1 VIEW COMPARISON:  Prior radiograph 04/22/2018 FINDINGS: Contrast material has been  instilled via a percutaneous gastrostomy tube. Contrast seen pooling in presumably the distal stomach at the level of the gastric antrum, around the insertion of the gastrostomy tube. Flow contrast seen extending into the second portion of the duodenum. No extraluminal contrast to suggest leakage or malpositioning. Additional contrast material noted within the visualized colon. Double-J ureteral stents in place. Bowel gas pattern is nonobstructive. IMPRESSION: Percutaneous gastrostomy tube in place within the distal stomach. No extraluminal contrast to suggest malpositioning or leak. Electronically Signed   By: Jeannine Boga M.D.   On: 04/26/2018 23:37    Assessment/Plan Active Problems:   Tracheostomy tube present (HCC)   Stage 5 chronic kidney disease on chronic dialysis (HCC)   Encephalopathy acute   Acute on chronic respiratory failure with hypoxia (HCC)   Healthcare-associated pneumonia   Bleeding at insertion site   1. Acute on chronic respiratory failure with hypoxia patient will be continued on full vent support no weaning at this time.  He has a high risk airway we need to monitor for any further bleeding very closely. 2. Stage V chronic kidney disease on dialysis follow-up with nephrology recommendations. 3. Acute encephalopathy grossly unchanged 4. Healthcare associated pneumonia treated we will continue to monitor his x-rays. 5. Tracheostomy as mentioned above patient has high risk airway 6. Bleeding for now seems to be resolved we will continue to monitor closely. 7. Laryngeal cancer prognosis poor   I have personally seen and evaluated the patient, evaluated laboratory and imaging results, formulated the assessment and plan and placed orders. The Patient requires high complexity decision making for assessment and support.  Patient is critically ill time 35 minutes review of chart discussion with the treatment team Case was discussed on Rounds with the Respiratory Therapy  Staff  Allyne Gee, MD [facility] Pulmonary Critical Care Medicine Sleep Medicine

## 2018-04-28 LAB — TYPE AND SCREEN
ABO/RH(D): B POS
Antibody Screen: NEGATIVE
UNIT DIVISION: 0
UNIT DIVISION: 0
UNIT DIVISION: 0
UNIT DIVISION: 0
UNIT DIVISION: 0
UNIT DIVISION: 0
UNIT DIVISION: 0
Unit division: 0
Unit division: 0

## 2018-04-28 LAB — BPAM RBC
BLOOD PRODUCT EXPIRATION DATE: 201912082359
BLOOD PRODUCT EXPIRATION DATE: 201912092359
BLOOD PRODUCT EXPIRATION DATE: 201912092359
BLOOD PRODUCT EXPIRATION DATE: 201912102359
BLOOD PRODUCT EXPIRATION DATE: 201912102359
BLOOD PRODUCT EXPIRATION DATE: 201912122359
BLOOD PRODUCT EXPIRATION DATE: 201912252359
BLOOD PRODUCT EXPIRATION DATE: 201912252359
Blood Product Expiration Date: 201912102359
ISSUE DATE / TIME: 201911271439
ISSUE DATE / TIME: 201911271506
ISSUE DATE / TIME: 201911272255
ISSUE DATE / TIME: 201911291527
ISSUE DATE / TIME: 201911271506
ISSUE DATE / TIME: 201911282338
ISSUE DATE / TIME: 201911291527
UNIT TYPE AND RH: 5100
UNIT TYPE AND RH: 7300
UNIT TYPE AND RH: 7300
UNIT TYPE AND RH: 7300
Unit Type and Rh: 5100
Unit Type and Rh: 7300
Unit Type and Rh: 7300
Unit Type and Rh: 7300
Unit Type and Rh: 7300

## 2018-04-28 LAB — CULTURE, RESPIRATORY W GRAM STAIN: Culture: NORMAL

## 2018-04-28 NOTE — Progress Notes (Signed)
Pulmonary Critical Care Medicine [place]   PULMONARY CRITICAL CARE SERVICE  PROGRESS NOTE  Date of Service: 04/28/2018  FRANKE MENTER  WYO:[id number]  DOB: 12/02/1955   DOA: 04/26/2018  Referring Physician: Merton Border, MD  HPI: Jimmy Martin is a 62 y.o. male seen for follow up of Acute on Chronic Respiratory Failure.  Patient is comfortable and has not been tolerating any weaning.  Today patient was on assist control mode  Medications: Reviewed on Rounds  Physical Exam:  Vitals: Temperature 99.4 pulse 75 respiratory rate 20 blood pressure 114/65 saturations 94%  Ventilator Settings mode of ventilation assist control FiO2 30% tidal volume 550 PEEP 5  . General: Comfortable at this time . Eyes: Grossly normal lids, irises & conjunctiva . ENT: grossly tongue is normal . Neck: no obvious mass . Cardiovascular: S1 S2 normal no gallop . Respiratory: Coarse breath sounds no rhonchi . Abdomen: soft . Skin: no rash seen on limited exam . Musculoskeletal: not rigid . Psychiatric:unable to assess . Neurologic: no seizure no involuntary movements         Lab Data:   Basic Metabolic Panel: Recent Labs  Lab 04/21/18 1823  04/23/18 0604 04/24/18 0958 04/24/18 1424 04/24/18 1605 04/25/18 0319 04/26/18 1033 04/26/18 1540  NA  --    < > 136 135 134* 136 136 137  --   K  --    < > 3.7 4.2 4.1 4.7 4.9 5.1  --   CL  --    < > 102 102 99  --  108 107  --   CO2  --   --  24 20*  --   --  17* 14*  --   GLUCOSE  --    < > 115* 121* 96  --  97 83  --   BUN  --    < > 63* 98* 111*  --  112* 132*  --   CREATININE  --    < > 4.13* 6.28* 6.90*  --  7.31* 9.39*  --   CALCIUM  --   --  7.6* 7.5*  7.6*  --   --  7.6* 8.0*  --   MG 2.5*  --   --   --   --   --  2.5* 2.7* 2.0  PHOS 9.1*  --   --  8.3*  --   --  9.1* 11.3* 4.0   < > = values in this interval not displayed.    ABG: Recent Labs  Lab 04/21/18 2038 04/22/18 0332 04/24/18 1605 04/25/18 0335  04/26/18 2134  PHART 7.428 7.352  --  7.249* 7.413  PCO2ART 37.4 45.1  --  46.2 39.5  PO2ART 155.0* 120.0*  --  208.0* 68.3*  HCO3 24.7 25.0 22.4 20.2 24.7  O2SAT 99.0 98.0 94.0 100.0 94.3    Liver Function Tests: Recent Labs  Lab 04/24/18 0958 04/26/18 1033  ALBUMIN 1.7* 1.9*   No results for input(s): LIPASE, AMYLASE in the last 168 hours. No results for input(s): AMMONIA in the last 168 hours.  CBC: Recent Labs  Lab 04/24/18 0958 04/24/18 1406  04/25/18 0319  04/25/18 1552 04/25/18 2249 04/26/18 0457 04/26/18 1540 04/27/18 2115  WBC 13.3* 13.5*  --  17.4*  --   --   --  13.3*  --  7.8  NEUTROABS  --  12.1*  --   --   --   --   --   --   --   --  HGB 7.7* 7.5*   < > 7.6*   < > 7.4* 6.9* 8.7*  8.0* 7.8* 7.1*  HCT 24.9* 25.6*   < > 23.3*   < > 22.1* 21.1* 28.2*  24.7* 23.5* 21.6*  MCV 94.7 98.5  --  91.4  --   --   --  95.3  --  91.5  PLT 105* 112*  --  75*  --   --   --  131*  --  137*   < > = values in this interval not displayed.    Cardiac Enzymes: Recent Labs  Lab 04/24/18 0958 04/24/18 1407  CKTOTAL 137  --   CKMB 6.1*  --   TROPONINI 0.36* 0.30*    BNP (last 3 results) No results for input(s): BNP in the last 8760 hours.  ProBNP (last 3 results) No results for input(s): PROBNP in the last 8760 hours.  Radiological Exams: Dg Abdomen Peg Tube Location  Result Date: 04/26/2018 CLINICAL DATA:  Initial evaluation for PEG tube placement EXAM: ABDOMEN - 1 VIEW COMPARISON:  Prior radiograph 04/22/2018 FINDINGS: Contrast material has been instilled via a percutaneous gastrostomy tube. Contrast seen pooling in presumably the distal stomach at the level of the gastric antrum, around the insertion of the gastrostomy tube. Flow contrast seen extending into the second portion of the duodenum. No extraluminal contrast to suggest leakage or malpositioning. Additional contrast material noted within the visualized colon. Double-J ureteral stents in place. Bowel gas  pattern is nonobstructive. IMPRESSION: Percutaneous gastrostomy tube in place within the distal stomach. No extraluminal contrast to suggest malpositioning or leak. Electronically Signed   By: Jeannine Boga M.D.   On: 04/26/2018 23:37    Assessment/Plan Active Problems:   Tracheostomy tube present (HCC)   Stage 5 chronic kidney disease on chronic dialysis (HCC)   Encephalopathy acute   Acute on chronic respiratory failure with hypoxia (HCC)   Healthcare-associated pneumonia   Bleeding at insertion site   1. Acute on chronic respiratory failure with hypoxia continue to check the patient's RSB I for weaning readiness.  We will continue with pulmonary toilet supportive care. 2. Healthcare associated pneumonia treated we will continue to monitor. 3. Tracheal bleed no active bleeding is noted at this time. 4. Stage V kidney disease followed by nephrology. 5. Encephalopathy unchanged   I have personally seen and evaluated the patient, evaluated laboratory and imaging results, formulated the assessment and plan and placed orders. The Patient requires high complexity decision making for assessment and support.  Case was discussed on Rounds with the Respiratory Therapy Staff  Allyne Gee, MD [facility] Pulmonary Critical Care Medicine Sleep Medicine

## 2018-04-29 NOTE — Progress Notes (Signed)
[facility]  ROUNDING NOTE   Subjective:  [facility]. Still on the ventilator. Due for dialysis again tomorrow.   Objective:  Vital signs in last 24 hours:  Temperature 98 pulse 87 respirations 14 blood pressure 95/51  Physical Exam: General: Critically ill appearing  Head: Normocephalic, atraumatic. Moist oral mucosal membranes  Eyes: Anicteric  Neck: Tracheostomy in place  Lungs:  Scattered rhonchi, vent assisted  Heart: S1S2 no rubs  Abdomen:  Soft, nontender, bowel sounds present  Extremities: trace peripheral edema.  Neurologic: Awake, alert, following commands  Skin: No lesions  Access: R IJ PC    Basic Metabolic Panel: Recent Labs  Lab 04/23/18 0604 04/24/18 0958 04/24/18 1424 04/24/18 1605 04/25/18 0319 04/26/18 1033 04/26/18 1540  NA 136 135 134* 136 136 137  --   K 3.7 4.2 4.1 4.7 4.9 5.1  --   CL 102 102 99  --  108 107  --   CO2 24 20*  --   --  17* 14*  --   GLUCOSE 115* 121* 96  --  97 83  --   BUN 63* 98* 111*  --  112* 132*  --   CREATININE 4.13* 6.28* 6.90*  --  7.31* 9.39*  --   CALCIUM 7.6* 7.5*  7.6*  --   --  7.6* 8.0*  --   MG  --   --   --   --  2.5* 2.7* 2.0  PHOS  --  8.3*  --   --  9.1* 11.3* 4.0    Liver Function Tests: Recent Labs  Lab 04/24/18 0958 04/26/18 1033  ALBUMIN 1.7* 1.9*   No results for input(s): LIPASE, AMYLASE in the last 168 hours. No results for input(s): AMMONIA in the last 168 hours.  CBC: Recent Labs  Lab 04/24/18 0958 04/24/18 1406  04/25/18 0319  04/25/18 1552 04/25/18 2249 04/26/18 0457 04/26/18 1540 04/27/18 2115  WBC 13.3* 13.5*  --  17.4*  --   --   --  13.3*  --  7.8  NEUTROABS  --  12.1*  --   --   --   --   --   --   --   --   HGB 7.7* 7.5*   < > 7.6*   < > 7.4* 6.9* 8.7*  8.0* 7.8* 7.1*  HCT 24.9* 25.6*   < > 23.3*   < > 22.1* 21.1* 28.2*  24.7* 23.5* 21.6*  MCV 94.7 98.5  --  91.4  --   --   --  95.3  --  91.5  PLT 105* 112*  --  75*   --   --   --  131*  --  137*   < > = values in this interval not displayed.    Cardiac Enzymes: Recent Labs  Lab 04/24/18 0958 04/24/18 1407  CKTOTAL 137  --   CKMB 6.1*  --   TROPONINI 0.36* 0.30*    BNP: Invalid input(s): POCBNP  CBG: Recent Labs  Lab 04/22/18 1125 04/22/18 1508 04/26/18 1603  GLUCAP 109* 107* 102*    Microbiology: Results for orders placed or performed during the hospital encounter of 04/24/18  Culture, respiratory (non-expectorated)     Status: None   Collection Time: 04/26/18  9:50 AM  Result Value Ref Range Status   Specimen Description TRACHEAL ASPIRATE  Final   Special Requests NONE  Final   Gram Stain   Final  ABUNDANT WBC PRESENT, PREDOMINANTLY PMN FEW GRAM POSITIVE COCCI FEW GRAM NEGATIVE RODS FEW GRAM POSITIVE RODS    Culture   Final    MODERATE Consistent with normal respiratory flora. Performed at [facility], [place] [address] [place], [place] [address]    Report Status 04/28/2018 FINAL  Final    Coagulation Studies: No results for input(s): LABPROT, INR in the last 72 hours.  Urinalysis: No results for input(s): COLORURINE, LABSPEC, PHURINE, GLUCOSEU, HGBUR, BILIRUBINUR, KETONESUR, PROTEINUR, UROBILINOGEN, NITRITE, LEUKOCYTESUR in the last 72 hours.  Invalid input(s): APPERANCEUR    Imaging: No results found.   Medications:       Assessment/ Plan:  62 y.o. 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  1.  ESRD on HD.    We will plan to perform hemodialysis again tomorrow.  Orders to be prepared.  2.  Acute respiratory failure.    Patient remains on the ventilator at this time.  Continue current ventilatory  support.  3.  Secondary hyperparathyroidism.    Repeat serum phosphorus tomorrow.  4.  Anemia of chronic kidney disease.  Hemoglobin 7.1 at last check.  Patient with recent bleeding from his tracheostomy.  Consider blood transfusion but defer to primary team.   LOS: 0 Steffi Noviello 12/2/20198:33 AM

## 2018-04-29 NOTE — Progress Notes (Signed)
Pulmonary Critical Care Medicine [place]   PULMONARY CRITICAL CARE SERVICE  PROGRESS NOTE  Date of Service: 04/29/2018  Jimmy Martin  RKY:[id number]  DOB: [birth year]   DOA: 04/26/2018  Referring Physician: Merton Border, MD  HPI: Jimmy Martin is a 62 y.o. male seen for follow up of Acute on Chronic Respiratory Failure.  Patient is currently on pressure support mode weaning the goal was for about 2 hours today.  Currently was on 28% FiO2  Medications: Reviewed on Rounds  Physical Exam:  Vitals: Temperature 98.0 pulse 87 respiratory rate 17 blood pressure 95/51 saturations 98%  Ventilator Settings mode of ventilation pressure support FiO2 28% pressure support 12 PEEP 5  . General: Comfortable at this time . Eyes: Grossly normal lids, irises & conjunctiva . ENT: grossly tongue is normal . Neck: no obvious mass . Cardiovascular: S1 S2 normal no gallop . Respiratory: Coarse breath sounds with a few rhonchi noted . Abdomen: soft . Skin: no rash seen on limited exam . Musculoskeletal: not rigid . Psychiatric:unable to assess . Neurologic: no seizure no involuntary movements         Lab Data:   Basic Metabolic Panel: Recent Labs  Lab 04/23/18 0604 04/24/18 0958 04/24/18 1424 04/24/18 1605 04/25/18 0319 04/26/18 1033 04/26/18 1540  NA 136 135 134* 136 136 137  --   K 3.7 4.2 4.1 4.7 4.9 5.1  --   CL 102 102 99  --  108 107  --   CO2 24 20*  --   --  17* 14*  --   GLUCOSE 115* 121* 96  --  97 83  --   BUN 63* 98* 111*  --  112* 132*  --   CREATININE 4.13* 6.28* 6.90*  --  7.31* 9.39*  --   CALCIUM 7.6* 7.5*  7.6*  --   --  7.6* 8.0*  --   MG  --   --   --   --  2.5* 2.7* 2.0  PHOS  --  8.3*  --   --  9.1* 11.3* 4.0    ABG: Recent Labs  Lab 04/24/18 1605 04/25/18 0335 04/26/18 2134  PHART  --  7.249* 7.413  PCO2ART  --  46.2 39.5  PO2ART  --  208.0* 68.3*  HCO3 22.4 20.2 24.7  O2SAT 94.0 100.0 94.3    Liver Function  Tests: Recent Labs  Lab 04/24/18 0958 04/26/18 1033  ALBUMIN 1.7* 1.9*   No results for input(s): LIPASE, AMYLASE in the last 168 hours. No results for input(s): AMMONIA in the last 168 hours.  CBC: Recent Labs  Lab 04/24/18 0958 04/24/18 1406  04/25/18 0319  04/25/18 1552 04/25/18 2249 04/26/18 0457 04/26/18 1540 04/27/18 2115  WBC 13.3* 13.5*  --  17.4*  --   --   --  13.3*  --  7.8  NEUTROABS  --  12.1*  --   --   --   --   --   --   --   --   HGB 7.7* 7.5*   < > 7.6*   < > 7.4* 6.9* 8.7*  8.0* 7.8* 7.1*  HCT 24.9* 25.6*   < > 23.3*   < > 22.1* 21.1* 28.2*  24.7* 23.5* 21.6*  MCV 94.7 98.5  --  91.4  --   --   --  95.3  --  91.5  PLT 105* 112*  --  75*  --   --   --  131*  --  137*   < > = values in this interval not displayed.    Cardiac Enzymes: Recent Labs  Lab 04/24/18 0958 04/24/18 1407  CKTOTAL 137  --   CKMB 6.1*  --   TROPONINI 0.36* 0.30*    BNP (last 3 results) No results for input(s): BNP in the last 8760 hours.  ProBNP (last 3 results) No results for input(s): PROBNP in the last 8760 hours.  Radiological Exams: No results found.  Assessment/Plan Active Problems:   Tracheostomy tube present (HCC)   Stage 5 chronic kidney disease on chronic dialysis (HCC)   Encephalopathy acute   Acute on chronic respiratory failure with hypoxia (HCC)   Healthcare-associated pneumonia   Bleeding at insertion site   1. Acute on chronic respiratory failure with hypoxia continue to wean on pressure support as noted above the goal is for 2 hours. 2. Stage V chronic kidney disease followed by nephrology for dialysis. 3. Encephalopathy grossly unchanged. 4. Bleeding resolved 5. Healthcare associated pneumonia treated we will continue to monitor   I have personally seen and evaluated the patient, evaluated laboratory and imaging results, formulated the assessment and plan and placed orders. The Patient requires high complexity decision making for assessment  and support.  Case was discussed on Rounds with the Respiratory Therapy Staff  Allyne Gee, MD [facility] Pulmonary Critical Care Medicine Sleep Medicine

## 2018-04-30 LAB — RENAL FUNCTION PANEL
Albumin: 1.7 g/dL — ABNORMAL LOW (ref 3.5–5.0)
Anion gap: 15 (ref 5–15)
BUN: 114 mg/dL — ABNORMAL HIGH (ref 6–20)
CO2: 22 mmol/L (ref 22–32)
Calcium: 7.8 mg/dL — ABNORMAL LOW (ref 8.9–10.3)
Chloride: 96 mmol/L — ABNORMAL LOW (ref 98–111)
Creatinine, Ser: 8.81 mg/dL — ABNORMAL HIGH (ref 0.61–1.24)
GFR calc Af Amer: 7 mL/min — ABNORMAL LOW (ref >60)
GFR calc non Af Amer: 6 mL/min — ABNORMAL LOW (ref >60)
GLUCOSE: 118 mg/dL — AB (ref 70–99)
Phosphorus: 7.8 mg/dL — ABNORMAL HIGH (ref 2.5–4.6)
Potassium: 3.9 mmol/L (ref 3.5–5.1)
Sodium: 133 mmol/L — ABNORMAL LOW (ref 135–145)

## 2018-04-30 LAB — CBC
HCT: 21.6 % — ABNORMAL LOW (ref 39.0–52.0)
Hemoglobin: 6.9 g/dL — CL (ref 13.0–17.0)
MCH: 29.7 pg (ref 26.0–34.0)
MCHC: 31.9 g/dL (ref 30.0–36.0)
MCV: 93.1 fL (ref 80.0–100.0)
Platelets: 165 10*3/uL (ref 150–400)
RBC: 2.32 MIL/uL — ABNORMAL LOW (ref 4.22–5.81)
RDW: 13.5 % (ref 11.5–15.5)
WBC: 6 10*3/uL (ref 4.0–10.5)
nRBC: 0 % (ref 0.0–0.2)

## 2018-04-30 LAB — PREPARE RBC (CROSSMATCH)

## 2018-04-30 NOTE — Progress Notes (Signed)
Pulmonary Critical Care Medicine [place]   PULMONARY CRITICAL CARE SERVICE  PROGRESS NOTE  Date of Service: 04/30/2018  Jimmy Martin  POE:[id number]  DOB: 1955-12-10   DOA: 04/26/2018  Referring Physician: Merton Border, MD  HPI: Jimmy Martin is a 62 y.o. male seen for follow up of Acute on Chronic Respiratory Failure.  Patient currently is on full support on assist control mode tidal volume of 550 PEEP 5  Medications: Reviewed on Rounds  Physical Exam:  Vitals: Temperature 97.7 pulse 69 respiratory rate 16 blood pressure 132/76 saturations 100%  Ventilator Settings mode of ventilation assist control FiO2 28% tidal volume 556 PEEP 5  . General: Comfortable at this time . Eyes: Grossly normal lids, irises & conjunctiva . ENT: grossly tongue is normal . Neck: no obvious mass . Cardiovascular: S1 S2 normal no gallop . Respiratory: Coarse breath sounds few rhonchi . Abdomen: soft . Skin: no rash seen on limited exam . Musculoskeletal: not rigid . Psychiatric:unable to assess . Neurologic: no seizure no involuntary movements         Lab Data:   Basic Metabolic Panel: Recent Labs  Lab 04/24/18 0958 04/24/18 1424 04/24/18 1605 04/25/18 0319 04/26/18 1033 04/26/18 1540 04/30/18 0541  NA 135 134* 136 136 137  --  133*  K 4.2 4.1 4.7 4.9 5.1  --  3.9  CL 102 99  --  108 107  --  96*  CO2 20*  --   --  17* 14*  --  22  GLUCOSE 121* 96  --  97 83  --  118*  BUN 98* 111*  --  112* 132*  --  114*  CREATININE 6.28* 6.90*  --  7.31* 9.39*  --  8.81*  CALCIUM 7.5*  7.6*  --   --  7.6* 8.0*  --  7.8*  MG  --   --   --  2.5* 2.7* 2.0  --   PHOS 8.3*  --   --  9.1* 11.3* 4.0 7.8*    ABG: Recent Labs  Lab 04/24/18 1605 04/25/18 0335 04/26/18 2134  PHART  --  7.249* 7.413  PCO2ART  --  46.2 39.5  PO2ART  --  208.0* 68.3*  HCO3 22.4 20.2 24.7  O2SAT 94.0 100.0 94.3    Liver Function Tests: Recent Labs  Lab 04/24/18 0958 04/26/18 1033  04/30/18 0541  ALBUMIN 1.7* 1.9* 1.7*   No results for input(s): LIPASE, AMYLASE in the last 168 hours. No results for input(s): AMMONIA in the last 168 hours.  CBC: Recent Labs  Lab 04/24/18 1406  04/25/18 0319  04/25/18 2249 04/26/18 0457 04/26/18 1540 04/27/18 2115 04/30/18 0541  WBC 13.5*  --  17.4*  --   --  13.3*  --  7.8 6.0  NEUTROABS 12.1*  --   --   --   --   --   --   --   --   HGB 7.5*   < > 7.6*   < > 6.9* 8.7*  8.0* 7.8* 7.1* 6.9*  HCT 25.6*   < > 23.3*   < > 21.1* 28.2*  24.7* 23.5* 21.6* 21.6*  MCV 98.5  --  91.4  --   --  95.3  --  91.5 93.1  PLT 112*  --  75*  --   --  131*  --  137* 165   < > = values in this interval not displayed.    Cardiac Enzymes:  Recent Labs  Lab 04/24/18 0958 04/24/18 1407  CKTOTAL 137  --   CKMB 6.1*  --   TROPONINI 0.36* 0.30*    BNP (last 3 results) No results for input(s): BNP in the last 8760 hours.  ProBNP (last 3 results) No results for input(s): PROBNP in the last 8760 hours.  Radiological Exams: No results found.  Assessment/Plan Active Problems:   Tracheostomy tube present (HCC)   Stage 5 chronic kidney disease on chronic dialysis (HCC)   Encephalopathy acute   Acute on chronic respiratory failure with hypoxia (HCC)   Healthcare-associated pneumonia   Bleeding at insertion site   1. Acute on chronic respiratory failure with hypoxia we will continue with full vent support at this time.  Patient's not been tolerating weaning respiratory therapy will continue to assess the spontaneous breathing index.  We will continue present management 2. 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 3. Healthcare associated pneumonia treated 4. Encephalopathy grossly unchanged 5. Tracheostomy bleeding site right now no active bleeding noted sutures were removed   I have personally seen and evaluated the patient, evaluated laboratory and imaging results, formulated the assessment and plan and  placed orders. The Patient requires high complexity decision making for assessment and support.  Case was discussed on Rounds with the Respiratory Therapy Staff  Allyne Gee, MD [facility] Pulmonary Critical Care Medicine Sleep Medicine

## 2018-05-01 ENCOUNTER — Institutional Professional Consult (permissible substitution) (HOSPITAL_COMMUNITY): Payer: MEDICAID

## 2018-05-01 LAB — TYPE AND SCREEN
ABO/RH(D): B POS
Antibody Screen: NEGATIVE
Unit division: 0

## 2018-05-01 LAB — BPAM RBC
Blood Product Expiration Date: 201912122359
ISSUE DATE / TIME: 201912031355
Unit Type and Rh: 7300

## 2018-05-01 NOTE — Progress Notes (Signed)
Pulmonary Critical Care Medicine [place]   PULMONARY CRITICAL CARE SERVICE  PROGRESS NOTE  Date of Service: 05/01/2018  Jimmy Martin  PIR:[id number]  DOB: 1956-05-21   DOA: 04/26/2018  Referring Physician: Merton Border, MD  HPI: Jimmy Martin is a 62 y.o. male seen for follow up of Acute on Chronic Respiratory Failure.  Patient is currently on full vent support is been failing weaning his hemoglobin is also on the low side will need a transfusion  Medications: Reviewed on Rounds  Physical Exam:  Vitals: Temperature 98.0 pulse 76 respiratory 15 blood pressure 129/75 saturations 99%  Ventilator Settings mode of ventilation assist control FiO2 28% tidal volume 550 PEEP 5  . General: Comfortable at this time . Eyes: Grossly normal lids, irises & conjunctiva . ENT: grossly tongue is normal . Neck: no obvious mass . Cardiovascular: S1 S2 normal no gallop . Respiratory: Coarse rhonchi noted bilaterally . Abdomen: soft . Skin: no rash seen on limited exam . Musculoskeletal: not rigid . Psychiatric:unable to assess . Neurologic: no seizure no involuntary movements         Lab Data:   Basic Metabolic Panel: Recent Labs  Lab 04/24/18 1424 04/24/18 1605 04/25/18 0319 04/26/18 1033 04/26/18 1540 04/30/18 0541  NA 134* 136 136 137  --  133*  K 4.1 4.7 4.9 5.1  --  3.9  CL 99  --  108 107  --  96*  CO2  --   --  17* 14*  --  22  GLUCOSE 96  --  97 83  --  118*  BUN 111*  --  112* 132*  --  114*  CREATININE 6.90*  --  7.31* 9.39*  --  8.81*  CALCIUM  --   --  7.6* 8.0*  --  7.8*  MG  --   --  2.5* 2.7* 2.0  --   PHOS  --   --  9.1* 11.3* 4.0 7.8*    ABG: Recent Labs  Lab 04/24/18 1605 04/25/18 0335 04/26/18 2134  PHART  --  7.249* 7.413  PCO2ART  --  46.2 39.5  PO2ART  --  208.0* 68.3*  HCO3 22.4 20.2 24.7  O2SAT 94.0 100.0 94.3    Liver Function Tests: Recent Labs  Lab 04/26/18 1033 04/30/18 0541  ALBUMIN 1.9* 1.7*   No results  for input(s): LIPASE, AMYLASE in the last 168 hours. No results for input(s): AMMONIA in the last 168 hours.  CBC: Recent Labs  Lab 04/24/18 1406  04/25/18 0319  04/25/18 2249 04/26/18 0457 04/26/18 1540 04/27/18 2115 04/30/18 0541  WBC 13.5*  --  17.4*  --   --  13.3*  --  7.8 6.0  NEUTROABS 12.1*  --   --   --   --   --   --   --   --   HGB 7.5*   < > 7.6*   < > 6.9* 8.7*  8.0* 7.8* 7.1* 6.9*  HCT 25.6*   < > 23.3*   < > 21.1* 28.2*  24.7* 23.5* 21.6* 21.6*  MCV 98.5  --  91.4  --   --  95.3  --  91.5 93.1  PLT 112*  --  75*  --   --  131*  --  137* 165   < > = values in this interval not displayed.    Cardiac Enzymes: Recent Labs  Lab 04/24/18 1407  TROPONINI 0.30*    BNP (last  3 results) No results for input(s): BNP in the last 8760 hours.  ProBNP (last 3 results) No results for input(s): PROBNP in the last 8760 hours.  Radiological Exams: Dg Chest Port 1 View  Result Date: 05/01/2018 CLINICAL DATA:  62 year old male with a history of respiratory failure EXAM: PORTABLE CHEST 1 VIEW COMPARISON:  04/25/2018, 04/20/2018 FINDINGS: Cardiomediastinal silhouette unchanged in size and contour. No pneumothorax or pleural effusion. Coarsened interstitial markings similar to prior. Similar appearance of right IJ approach hemodialysis catheter with the tip terminating at the superior cavoatrial junction. Unchanged tracheostomy tube. IMPRESSION: Similar appearance of the chest x-ray with coarsened interstitial markings and no evidence of acute cardiopulmonary disease. Unchanged right IJ approach hemodialysis catheter and tracheostomy Electronically Signed   By: Corrie Mckusick D.O.   On: 05/01/2018 10:10    Assessment/Plan Active Problems:   Tracheostomy tube present (HCC)   Stage 5 chronic kidney disease on chronic dialysis (HCC)   Encephalopathy acute   Acute on chronic respiratory failure with hypoxia (HCC)   Healthcare-associated pneumonia   Bleeding at insertion  site   1. Acute on chronic respiratory failure with hypoxia we will continue with full support on assist control.  Continue pulmonary toilet secretion management.  Patient's not tolerating weaning attempts at this time possibly related to fluid overload and also low hemoglobin at this time 2. Stage V kidney disease end-stage on dialysis will continue with supportive care followed by nephrology. 3. Encephalopathy grossly unchanged continue present management 4. Healthcare associated pneumonia treated still has some interstitial changes noted on the chest film. 5. Bleeding resolved overall prognosis guarded   I have personally seen and evaluated the patient, evaluated laboratory and imaging results, formulated the assessment and plan and placed orders. The Patient requires high complexity decision making for assessment and support.  Case was discussed on Rounds with the Respiratory Therapy Staff  Allyne Gee, MD [facility] Pulmonary Critical Care Medicine Sleep Medicine

## 2018-05-01 NOTE — Progress Notes (Signed)
[facility]  ROUNDING NOTE   Subjective:  Patient seen at bedside. Still on the ventilator. Due for dialysis again tomorrow.   Objective:  Vital signs in last 24 hours:  Temperature 98 pulse 76 respirations 15 blood pressure 129/75  Physical Exam: General: Critically ill appearing  Head: Normocephalic, atraumatic. Moist oral mucosal membranes  Eyes: Anicteric  Neck: Tracheostomy in place  Lungs:  Scattered rhonchi, vent assisted  Heart: S1S2 no rubs  Abdomen:  Soft, nontender, bowel sounds present  Extremities: trace peripheral edema.  Neurologic: Awake, alert, following commands  Skin: No lesions  Access: R IJ PC    Basic Metabolic Panel: Recent Labs  Lab 04/24/18 1424 04/24/18 1605 04/25/18 0319 04/26/18 1033 04/26/18 1540 04/30/18 0541  NA 134* 136 136 137  --  133*  K 4.1 4.7 4.9 5.1  --  3.9  CL 99  --  108 107  --  96*  CO2  --   --  17* 14*  --  22  GLUCOSE 96  --  97 83  --  118*  BUN 111*  --  112* 132*  --  114*  CREATININE 6.90*  --  7.31* 9.39*  --  8.81*  CALCIUM  --   --  7.6* 8.0*  --  7.8*  MG  --   --  2.5* 2.7* 2.0  --   PHOS  --   --  9.1* 11.3* 4.0 7.8*    Liver Function Tests: Recent Labs  Lab 04/26/18 1033 04/30/18 0541  ALBUMIN 1.9* 1.7*   No results for input(s): LIPASE, AMYLASE in the last 168 hours. No results for input(s): AMMONIA in the last 168 hours.  CBC: Recent Labs  Lab 04/24/18 1406  04/25/18 0319  04/25/18 2249 04/26/18 0457 04/26/18 1540 04/27/18 2115 04/30/18 0541  WBC 13.5*  --  17.4*  --   --  13.3*  --  7.8 6.0  NEUTROABS 12.1*  --   --   --   --   --   --   --   --   HGB 7.5*   < > 7.6*   < > 6.9* 8.7*  8.0* 7.8* 7.1* 6.9*  HCT 25.6*   < > 23.3*   < > 21.1* 28.2*  24.7* 23.5* 21.6* 21.6*  MCV 98.5  --  91.4  --   --  95.3  --  91.5 93.1  PLT 112*  --  75*  --   --  131*  --  137* 165   < > = values in this interval not displayed.    Cardiac Enzymes: Recent Labs  Lab 04/24/18 1407   TROPONINI 0.30*    BNP: Invalid input(s): POCBNP  CBG: Recent Labs  Lab 04/26/18 1603  GLUCAP 102*    Microbiology: Results for orders placed or performed during the hospital encounter of 04/24/18  Culture, respiratory (non-expectorated)     Status: None   Collection Time: 04/26/18  9:50 AM  Result Value Ref Range Status   Specimen Description TRACHEAL ASPIRATE  Final   Special Requests NONE  Final   Gram Stain   Final    ABUNDANT WBC PRESENT, PREDOMINANTLY PMN FEW GRAM POSITIVE COCCI FEW GRAM NEGATIVE RODS FEW GRAM POSITIVE RODS    Culture   Final    MODERATE Consistent with normal respiratory flora. Performed at [facility], [place] [address] [place], [place] [address]    Report Status 04/28/2018 FINAL  Final  Coagulation Studies: No results for input(s): LABPROT, INR in the last 72 hours.  Urinalysis: No results for input(s): COLORURINE, LABSPEC, PHURINE, GLUCOSEU, HGBUR, BILIRUBINUR, KETONESUR, PROTEINUR, UROBILINOGEN, NITRITE, LEUKOCYTESUR in the last 72 hours.  Invalid input(s): APPERANCEUR    Imaging: Dg Chest Port 1 View  Result Date: 05/01/2018 CLINICAL DATA:  62 year old male with a history of respiratory failure EXAM: PORTABLE CHEST 1 VIEW COMPARISON:  04/25/2018, 04/20/2018 FINDINGS: Cardiomediastinal silhouette unchanged in size and contour. No pneumothorax or pleural effusion. Coarsened interstitial markings similar to prior. Similar appearance of right IJ approach hemodialysis catheter with the tip terminating at the superior cavoatrial junction. Unchanged tracheostomy tube. IMPRESSION: Similar appearance of the chest x-ray with coarsened interstitial markings and no evidence of acute cardiopulmonary disease. Unchanged right IJ approach hemodialysis catheter and tracheostomy Electronically Signed   By: Corrie Mckusick D.O.   On: 05/01/2018 10:10     Medications:       Assessment/ Plan:  62 y.o. male with a PMHx of ESRD on HD previously on  PD, R IJ PC placement, airway obstruction status post tracheostomy placement, squamous cell carcinoma of the larynx, acute on chronic respiratory failure, COPD, pneumonia, atrial fibrillation, status post PEG tube placement, who was admitted to [facility] on 04/22/2018 for ongoing management.  1.  ESRD on HD.    Patient due for hemodialysis tomorrow.  Orders to be prepared.  2.  Acute respiratory failure.    She is still on the ventilator.  Continue vent support.  3.  Secondary hyperparathyroidism.    And phosphorus down to 7.8.  Discussed with dietitian.  We will lower TPN rate.  4.  Anemia of chronic kidney disease.  Hemoglobin down to 6.9.  Consider blood transfusion with next dialysis treatment.   LOS: 0 Briyan Kleven 12/4/20191:56 PM

## 2018-05-02 LAB — RENAL FUNCTION PANEL
Albumin: 1.9 g/dL — ABNORMAL LOW (ref 3.5–5.0)
Anion gap: 13 (ref 5–15)
BUN: 77 mg/dL — ABNORMAL HIGH (ref 6–20)
CO2: 24 mmol/L (ref 22–32)
Calcium: 8 mg/dL — ABNORMAL LOW (ref 8.9–10.3)
Chloride: 98 mmol/L (ref 98–111)
Creatinine, Ser: 6.77 mg/dL — ABNORMAL HIGH (ref 0.61–1.24)
GFR calc non Af Amer: 8 mL/min — ABNORMAL LOW (ref >60)
GFR, EST AFRICAN AMERICAN: 9 mL/min — AB (ref >60)
Glucose, Bld: 105 mg/dL — ABNORMAL HIGH (ref 70–99)
Phosphorus: 5.8 mg/dL — ABNORMAL HIGH (ref 2.5–4.6)
Potassium: 3.5 mmol/L (ref 3.5–5.1)
Sodium: 135 mmol/L (ref 135–145)

## 2018-05-02 LAB — CBC
HCT: 24.2 % — ABNORMAL LOW (ref 39.0–52.0)
Hemoglobin: 7.8 g/dL — ABNORMAL LOW (ref 13.0–17.0)
MCH: 29.9 pg (ref 26.0–34.0)
MCHC: 32.2 g/dL (ref 30.0–36.0)
MCV: 92.7 fL (ref 80.0–100.0)
Platelets: 206 10*3/uL (ref 150–400)
RBC: 2.61 MIL/uL — ABNORMAL LOW (ref 4.22–5.81)
RDW: 14.6 % (ref 11.5–15.5)
WBC: 7.8 10*3/uL (ref 4.0–10.5)
nRBC: 0 % (ref 0.0–0.2)

## 2018-05-02 NOTE — Progress Notes (Signed)
Pulmonary Critical Care Medicine [place]   PULMONARY CRITICAL CARE SERVICE  PROGRESS NOTE  Date of Service: 05/02/2018  Jimmy Martin  PPI:[id number]  DOB: 04/25/1956   DOA: 04/26/2018  Referring Physician: Merton Border, MD  HPI: Jimmy Martin is a 62 y.o. male seen for follow up of Acute on Chronic Respiratory Failure.  Remains on full vent support.  Has not been tolerating weaning very well.  Patient was for dialysis today  Medications: Reviewed on Rounds  Physical Exam:  Vitals: Temperature 98.2 pulse 70 respiratory 12 blood pressure 112/62 saturation 97%  Ventilator Settings mode ventilation assist control FiO2 28% tidal volume 471 PEEP 5  . General: Comfortable at this time . Eyes: Grossly normal lids, irises & conjunctiva . ENT: grossly tongue is normal . Neck: no obvious mass . Cardiovascular: S1 S2 normal no gallop . Respiratory: Coarse breath sounds few rhonchi . Abdomen: soft . Skin: no rash seen on limited exam . Musculoskeletal: not rigid . Psychiatric:unable to assess . Neurologic: no seizure no involuntary movements         Lab Data:   Basic Metabolic Panel: Recent Labs  Lab 04/26/18 1033 04/26/18 1540 04/30/18 0541 05/02/18 0505  NA 137  --  133* 135  K 5.1  --  3.9 3.5  CL 107  --  96* 98  CO2 14*  --  22 24  GLUCOSE 83  --  118* 105*  BUN 132*  --  114* 77*  CREATININE 9.39*  --  8.81* 6.77*  CALCIUM 8.0*  --  7.8* 8.0*  MG 2.7* 2.0  --   --   PHOS 11.3* 4.0 7.8* 5.8*    ABG: Recent Labs  Lab 04/26/18 2134  PHART 7.413  PCO2ART 39.5  PO2ART 68.3*  HCO3 24.7  O2SAT 94.3    Liver Function Tests: Recent Labs  Lab 04/26/18 1033 04/30/18 0541 05/02/18 0505  ALBUMIN 1.9* 1.7* 1.9*   No results for input(s): LIPASE, AMYLASE in the last 168 hours. No results for input(s): AMMONIA in the last 168 hours.  CBC: Recent Labs  Lab 04/26/18 0457 04/26/18 1540 04/27/18 2115 04/30/18 0541 05/02/18 0505  WBC  13.3*  --  7.8 6.0 7.8  HGB 8.7*  8.0* 7.8* 7.1* 6.9* 7.8*  HCT 28.2*  24.7* 23.5* 21.6* 21.6* 24.2*  MCV 95.3  --  91.5 93.1 92.7  PLT 131*  --  137* 165 206    Cardiac Enzymes: No results for input(s): CKTOTAL, CKMB, CKMBINDEX, TROPONINI in the last 168 hours.  BNP (last 3 results) No results for input(s): BNP in the last 8760 hours.  ProBNP (last 3 results) No results for input(s): PROBNP in the last 8760 hours.  Radiological Exams: Dg Chest Port 1 View  Result Date: 05/01/2018 CLINICAL DATA:  62 year old male with a history of respiratory failure EXAM: PORTABLE CHEST 1 VIEW COMPARISON:  04/25/2018, 04/20/2018 FINDINGS: Cardiomediastinal silhouette unchanged in size and contour. No pneumothorax or pleural effusion. Coarsened interstitial markings similar to prior. Similar appearance of right IJ approach hemodialysis catheter with the tip terminating at the superior cavoatrial junction. Unchanged tracheostomy tube. IMPRESSION: Similar appearance of the chest x-ray with coarsened interstitial markings and no evidence of acute cardiopulmonary disease. Unchanged right IJ approach hemodialysis catheter and tracheostomy Electronically Signed   By: Corrie Mckusick D.O.   On: 05/01/2018 10:10    Assessment/Plan Active Problems:   Tracheostomy tube present (HCC)   Stage 5 chronic kidney disease on chronic  dialysis [facility])   Encephalopathy acute   Acute on chronic respiratory failure with hypoxia (HCC)   Healthcare-associated pneumonia   Bleeding at insertion site   1. Acute on chronic respiratory failure with hypoxia we will continue with weaning attempts.  Right now not tolerating remains on assist control continue pulmonary toilet supportive care 2. Stage V end-stage kidney disease on dialysis we will continue present management. 3. Care associated pneumonia treated chest x-ray still showing interstitial markings likely edema related 4. Bleeding resolved 5. Encephalopathy  unchanged 6. Tracheostomy remains in place   I have personally seen and evaluated the patient, evaluated laboratory and imaging results, formulated the assessment and plan and placed orders. The Patient requires high complexity decision making for assessment and support.  Case was discussed on Rounds with the Respiratory Therapy Staff  Allyne Gee, MD [facility] Pulmonary Critical Care Medicine Sleep Medicine

## 2018-05-03 NOTE — Progress Notes (Signed)
[facility]  ROUNDING NOTE   Subjective:  Patient due for hemodialysis again tomorrow. We will plan to switch him to Monday Wednesday Friday next week.   Objective:  Vital signs in last 24 hours:  Temperature 98 pulse 68 respirations 16 blood pressure 107/61  Physical Exam: General: Critically ill appearing  Head: Normocephalic, atraumatic. Moist oral mucosal membranes  Eyes: Anicteric  Neck: Tracheostomy in place  Lungs:  Scattered rhonchi, vent assisted  Heart: S1S2 no rubs  Abdomen:  Soft, nontender, bowel sounds present  Extremities: trace peripheral edema.  Neurologic: Awake, alert, following commands  Skin: No lesions  Access: R IJ PC    Basic Metabolic Panel: Recent Labs  Lab 04/26/18 1540 04/30/18 0541 05/02/18 0505  NA  --  133* 135  K  --  3.9 3.5  CL  --  96* 98  CO2  --  22 24  GLUCOSE  --  118* 105*  BUN  --  114* 77*  CREATININE  --  8.81* 6.77*  CALCIUM  --  7.8* 8.0*  MG 2.0  --   --   PHOS 4.0 7.8* 5.8*    Liver Function Tests: Recent Labs  Lab 04/30/18 0541 05/02/18 0505  ALBUMIN 1.7* 1.9*   No results for input(s): LIPASE, AMYLASE in the last 168 hours. No results for input(s): AMMONIA in the last 168 hours.  CBC: Recent Labs  Lab 04/26/18 1540 04/27/18 2115 04/30/18 0541 05/02/18 0505  WBC  --  7.8 6.0 7.8  HGB 7.8* 7.1* 6.9* 7.8*  HCT 23.5* 21.6* 21.6* 24.2*  MCV  --  91.5 93.1 92.7  PLT  --  137* 165 206    Cardiac Enzymes: No results for input(s): CKTOTAL, CKMB, CKMBINDEX, TROPONINI in the last 168 hours.  BNP: Invalid input(s): POCBNP  CBG: Recent Labs  Lab 04/26/18 1603  GLUCAP 102*    Microbiology: Results for orders placed or performed during the hospital encounter of 04/26/18  Culture, respiratory     Status: None (Preliminary result)   Collection Time: 05/01/18  3:34 PM  Result Value Ref Range Status   Specimen Description TRACHEAL ASPIRATE  Final   Special Requests NONE  Final   Gram  Stain   Final    MODERATE WBC PRESENT,BOTH PMN AND MONONUCLEAR MODERATE GRAM POSITIVE COCCI IN PAIRS FEW GRAM NEGATIVE RODS MODERATE GRAM POSITIVE RODS RARE SQUAMOUS EPITHELIAL CELLS PRESENT Performed at [facility], [place] [address], [place], [place] [address]    Culture FEW METHICILLIN RESISTANT STAPHYLOCOCCUS AUREUS  Final   Report Status PENDING  Incomplete   Organism ID, Bacteria METHICILLIN RESISTANT STAPHYLOCOCCUS AUREUS  Final      Susceptibility   Methicillin resistant staphylococcus aureus - MIC*    CIPROFLOXACIN >=8 RESISTANT Resistant     ERYTHROMYCIN <=0.25 SENSITIVE Sensitive     GENTAMICIN <=0.5 SENSITIVE Sensitive     OXACILLIN >=4 RESISTANT Resistant     TETRACYCLINE <=1 SENSITIVE Sensitive     VANCOMYCIN <=0.5 SENSITIVE Sensitive     TRIMETH/SULFA <=10 SENSITIVE Sensitive     CLINDAMYCIN <=0.25 SENSITIVE Sensitive     RIFAMPIN <=0.5 SENSITIVE Sensitive     Inducible Clindamycin NEGATIVE Sensitive     * FEW METHICILLIN RESISTANT STAPHYLOCOCCUS AUREUS    Coagulation Studies: No results for input(s): LABPROT, INR in the last 72 hours.  Urinalysis: No results for input(s): COLORURINE, LABSPEC, PHURINE, GLUCOSEU, HGBUR, BILIRUBINUR, KETONESUR, PROTEINUR, UROBILINOGEN, NITRITE, LEUKOCYTESUR in the last 72 hours.  Invalid input(s):  APPERANCEUR    Imaging: No results found.   Medications:       Assessment/ Plan:  62 y.o. 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  1.  ESRD on HD.    Patient due for hemodialysis again tomorrow.  Orders to be prepared.  Thereafter we will switch him to a Monday, Wednesday, Friday schedule.  2.  Acute respiratory failure.     Continue current ventilatory support.  3.  Secondary hyperparathyroidism.    Phosphorus now down to 5.8.  Continue to monitor.  4.  Anemia of chronic kidney disease.  Hemoglobin currently 7.8 posttransfusion.   LOS: 0 Adryanna Friedt 12/6/20192:36 PM

## 2018-05-03 NOTE — Progress Notes (Signed)
Pulmonary Critical Care Medicine [place]   PULMONARY CRITICAL CARE SERVICE  PROGRESS NOTE  Date of Service: 05/03/2018  TRINO HIGINBOTHAM  BMW:[id number]  DOB: 07-06-1955   DOA: 04/26/2018  Referring Physician: Merton Border, MD  HPI: ADDIEL MCCARDLE is a 62 y.o. male seen for follow up of Acute on Chronic Respiratory Failure.  Patient is on full vent support.  He is currently on 28% oxygen supposed to have the PMV trial today  Medications: Reviewed on Rounds  Physical Exam:  Vitals: Temperature 98.5 pulse 68 respiratory 16 blood pressure 107/61 saturations 97%  Ventilator Settings currently is on full support assist control FiO2 28% tidal volume 325 PEEP 5  . General: Comfortable at this time . Eyes: Grossly normal lids, irises & conjunctiva . ENT: grossly tongue is normal . Neck: no obvious mass . Cardiovascular: S1 S2 normal no gallop . Respiratory: No rhonchi or rales are noted . Abdomen: soft . Skin: no rash seen on limited exam . Musculoskeletal: not rigid . Psychiatric:unable to assess . Neurologic: no seizure no involuntary movements         Lab Data:   Basic Metabolic Panel: Recent Labs  Lab 04/26/18 1540 04/30/18 0541 05/02/18 0505  NA  --  133* 135  K  --  3.9 3.5  CL  --  96* 98  CO2  --  22 24  GLUCOSE  --  118* 105*  BUN  --  114* 77*  CREATININE  --  8.81* 6.77*  CALCIUM  --  7.8* 8.0*  MG 2.0  --   --   PHOS 4.0 7.8* 5.8*    ABG: Recent Labs  Lab 04/26/18 2134  PHART 7.413  PCO2ART 39.5  PO2ART 68.3*  HCO3 24.7  O2SAT 94.3    Liver Function Tests: Recent Labs  Lab 04/30/18 0541 05/02/18 0505  ALBUMIN 1.7* 1.9*   No results for input(s): LIPASE, AMYLASE in the last 168 hours. No results for input(s): AMMONIA in the last 168 hours.  CBC: Recent Labs  Lab 04/26/18 1540 04/27/18 2115 04/30/18 0541 05/02/18 0505  WBC  --  7.8 6.0 7.8  HGB 7.8* 7.1* 6.9* 7.8*  HCT 23.5* 21.6* 21.6* 24.2*  MCV  --  91.5  93.1 92.7  PLT  --  137* 165 206    Cardiac Enzymes: No results for input(s): CKTOTAL, CKMB, CKMBINDEX, TROPONINI in the last 168 hours.  BNP (last 3 results) No results for input(s): BNP in the last 8760 hours.  ProBNP (last 3 results) No results for input(s): PROBNP in the last 8760 hours.  Radiological Exams: No results found.  Assessment/Plan Active Problems:   Tracheostomy tube present (HCC)   Stage 5 chronic kidney disease on chronic dialysis (HCC)   Encephalopathy acute   Acute on chronic respiratory failure with hypoxia (HCC)   Healthcare-associated pneumonia   Bleeding at insertion site   1. Acute on chronic respiratory failure with hypoxia we will continue with full support on the ventilator patient will be tried on PMV today 2. Stage V chronic disease of the kidneys patient right now is on dialysis 3. Chronic encephalopathy unchanged stable 4. Healthcare associated pneumonia treated we will continue to follow. 5. Tracheostomy with bleeding resolved   I have personally seen and evaluated the patient, evaluated laboratory and imaging results, formulated the assessment and plan and placed orders. The Patient requires high complexity decision making for assessment and support.  Case was discussed on Rounds with the  Respiratory Therapy Staff  Allyne Gee, MD [facility] Pulmonary Critical Care Medicine Sleep Medicine

## 2018-05-04 ENCOUNTER — Other Ambulatory Visit (HOSPITAL_COMMUNITY): Payer: MEDICAID

## 2018-05-04 LAB — CULTURE, RESPIRATORY W GRAM STAIN

## 2018-05-04 LAB — CBC
HCT: 23.8 % — ABNORMAL LOW (ref 39.0–52.0)
Hemoglobin: 7.6 g/dL — ABNORMAL LOW (ref 13.0–17.0)
MCH: 29.6 pg (ref 26.0–34.0)
MCHC: 31.9 g/dL (ref 30.0–36.0)
MCV: 92.6 fL (ref 80.0–100.0)
Platelets: 252 10*3/uL (ref 150–400)
RBC: 2.57 MIL/uL — ABNORMAL LOW (ref 4.22–5.81)
RDW: 14.1 % (ref 11.5–15.5)
WBC: 6.5 10*3/uL (ref 4.0–10.5)
nRBC: 0 % (ref 0.0–0.2)

## 2018-05-04 LAB — RENAL FUNCTION PANEL
Albumin: 1.7 g/dL — ABNORMAL LOW (ref 3.5–5.0)
Anion gap: 14 (ref 5–15)
BUN: 61 mg/dL — ABNORMAL HIGH (ref 6–20)
CO2: 25 mmol/L (ref 22–32)
Calcium: 8 mg/dL — ABNORMAL LOW (ref 8.9–10.3)
Chloride: 98 mmol/L (ref 98–111)
Creatinine, Ser: 6.36 mg/dL — ABNORMAL HIGH (ref 0.61–1.24)
GFR calc Af Amer: 10 mL/min — ABNORMAL LOW (ref >60)
GFR calc non Af Amer: 9 mL/min — ABNORMAL LOW (ref >60)
GLUCOSE: 120 mg/dL — AB (ref 70–99)
Phosphorus: 5 mg/dL — ABNORMAL HIGH (ref 2.5–4.6)
Potassium: 3.5 mmol/L (ref 3.5–5.1)
SODIUM: 137 mmol/L (ref 135–145)

## 2018-05-04 LAB — CULTURE, RESPIRATORY

## 2018-05-04 NOTE — Progress Notes (Signed)
Pulmonary Critical Care Medicine [place]   PULMONARY CRITICAL CARE SERVICE  PROGRESS NOTE  Date of Service: 05/04/2018  BRNADON EOFF  NLG:[id number]  DOB: Jul 18, 1955   DOA: 04/26/2018  Referring Physician: Merton Border, MD  HPI: Jimmy Martin is a 62 y.o. male seen for follow up of Acute on Chronic Respiratory Failure.  Patient remains on ventilator at this time.  RT reports another episode of bleeding from trach this morning.  There does not appear to be blood in the airway.  He failed an SBT this morning also.  Medications: Reviewed on Rounds  Physical Exam:  Vitals: Pulse 71 respirations 20 blood pressure 151/88 O2 saturation 100% temperature 98.2  Ventilator Settings ventilator mode AC VC tidal volume 550 rate 16 FiO2 28% PEEP of 5  . General: Comfortable at this time . Eyes: Grossly normal lids, irises & conjunctiva . ENT: grossly tongue is normal . Neck: no obvious mass . Cardiovascular: S1 S2 normal no gallop . Respiratory: Coarse breath sounds bilaterally . Abdomen: soft . Skin: no rash seen on limited exam . Musculoskeletal: not rigid . Psychiatric:unable to assess . Neurologic: no seizure no involuntary movements         Lab Data:   Basic Metabolic Panel: Recent Labs  Lab 04/30/18 0541 05/02/18 0505 05/04/18 0546  NA 133* 135 137  K 3.9 3.5 3.5  CL 96* 98 98  CO2 22 24 25   GLUCOSE 118* 105* 120*  BUN 114* 77* 61*  CREATININE 8.81* 6.77* 6.36*  CALCIUM 7.8* 8.0* 8.0*  PHOS 7.8* 5.8* 5.0*    ABG: No results for input(s): PHART, PCO2ART, PO2ART, HCO3, O2SAT in the last 168 hours.  Liver Function Tests: Recent Labs  Lab 04/30/18 0541 05/02/18 0505 05/04/18 0546  ALBUMIN 1.7* 1.9* 1.7*   No results for input(s): LIPASE, AMYLASE in the last 168 hours. No results for input(s): AMMONIA in the last 168 hours.  CBC: Recent Labs  Lab 04/27/18 2115 04/30/18 0541 05/02/18 0505 05/04/18 0546  WBC 7.8 6.0 7.8 6.5  HGB  7.1* 6.9* 7.8* 7.6*  HCT 21.6* 21.6* 24.2* 23.8*  MCV 91.5 93.1 92.7 92.6  PLT 137* 165 206 252    Cardiac Enzymes: No results for input(s): CKTOTAL, CKMB, CKMBINDEX, TROPONINI in the last 168 hours.  BNP (last 3 results) No results for input(s): BNP in the last 8760 hours.  ProBNP (last 3 results) No results for input(s): PROBNP in the last 8760 hours.  Radiological Exams: No results found.  Assessment/Plan Active Problems:   Tracheostomy tube present (HCC)   Stage 5 chronic kidney disease on chronic dialysis (HCC)   Encephalopathy acute   Acute on chronic respiratory failure with hypoxia (HCC)   Healthcare-associated pneumonia   Bleeding at insertion site   1. Acute on chronic respiratory failure with hypoxia continue full ventilator support at this time.  Allow patient to rest and then retry breathing trial this afternoon. 2. Stage V chronic disease of the kidneys patient will have dialysis today. 3. Chronic encephalopathy unchanged stable at this time 4. Healthcare associated pneumonia treated.  Respiratory cultures ordered today 5. Tracheostomy patient has minor superficial bleeding this morning we will continue to monitor.   I have personally seen and evaluated the patient, evaluated laboratory and imaging results, formulated the assessment and plan and placed orders. The Patient requires high complexity decision making for assessment and support.  Case was discussed on Rounds with the Respiratory Therapy Staff  Allyne Gee,  MD [facility] Pulmonary Critical Care Medicine Sleep Medicine

## 2018-05-05 ENCOUNTER — Other Ambulatory Visit (HOSPITAL_COMMUNITY): Payer: MEDICAID

## 2018-05-05 NOTE — Progress Notes (Signed)
Pulmonary Critical Care Medicine [place]   PULMONARY CRITICAL CARE SERVICE  PROGRESS NOTE  Date of Service: 05/05/2018  Jimmy Martin  YHC:[id number]  DOB: 1955-11-13   DOA: 04/26/2018  Referring Physician: Merton Border, MD  HPI: Jimmy Martin is a 62 y.o. male seen for follow up of Acute on Chronic Respiratory Failure.  Patient remains on full support with ventilator at this time.  Patient has intermittent episodes of agitation.  He is currently receiving PRN medication to assist with this.    Medications: Reviewed on Rounds  Physical Exam:  Vitals: Pulse 98 respirations 18 blood pressure 147/76 O2 saturation 100% temp 99.4  Ventilator Settings AC VC FiO2 28% tidal volume 550 rate 16 PEEP of 5  . General: Comfortable at this time . Eyes: Grossly normal lids, irises & conjunctiva . ENT: grossly tongue is normal . Neck: no obvious mass . Cardiovascular: S1 S2 normal no gallop . Respiratory: Coarse breath sounds . Abdomen: soft . Skin: no rash seen on limited exam . Musculoskeletal: not rigid . Psychiatric:unable to assess . Neurologic: no seizure no involuntary movements         Lab Data:   Basic Metabolic Panel: Recent Labs  Lab 04/30/18 0541 05/02/18 0505 05/04/18 0546  NA 133* 135 137  K 3.9 3.5 3.5  CL 96* 98 98  CO2 22 24 25   GLUCOSE 118* 105* 120*  BUN 114* 77* 61*  CREATININE 8.81* 6.77* 6.36*  CALCIUM 7.8* 8.0* 8.0*  PHOS 7.8* 5.8* 5.0*    ABG: No results for input(s): PHART, PCO2ART, PO2ART, HCO3, O2SAT in the last 168 hours.  Liver Function Tests: Recent Labs  Lab 04/30/18 0541 05/02/18 0505 05/04/18 0546  ALBUMIN 1.7* 1.9* 1.7*   No results for input(s): LIPASE, AMYLASE in the last 168 hours. No results for input(s): AMMONIA in the last 168 hours.  CBC: Recent Labs  Lab 04/30/18 0541 05/02/18 0505 05/04/18 0546  WBC 6.0 7.8 6.5  HGB 6.9* 7.8* 7.6*  HCT 21.6* 24.2* 23.8*  MCV 93.1 92.7 92.6  PLT 165 206  252    Cardiac Enzymes: No results for input(s): CKTOTAL, CKMB, CKMBINDEX, TROPONINI in the last 168 hours.  BNP (last 3 results) No results for input(s): BNP in the last 8760 hours.  ProBNP (last 3 results) No results for input(s): PROBNP in the last 8760 hours.  Radiological Exams: Dg Chest Port 1 View  Result Date: 05/05/2018 CLINICAL DATA:  Weaning from ventilator EXAM: PORTABLE CHEST 1 VIEW COMPARISON:  One day prior FINDINGS: Dialysis catheter unchanged. Tracheostomy appropriately positioned. Normal heart size. Atherosclerosis in the transverse aorta. No pleural effusion or pneumothorax. Hyperinflation, without lobar consolidation. Chronic interstitial thickening with probable calcified granulomas, more conspicuous on the left. IMPRESSION: Hyperinflation, without acute disease. Aortic Atherosclerosis (ICD10-I70.0). Electronically Signed   By: Abigail Miyamoto M.D.   On: 05/05/2018 09:44   Dg Chest Port 1 View  Result Date: 05/04/2018 CLINICAL DATA:  Shortness of breath and cough. 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 EXAM: PORTABLE CHEST 1 VIEW COMPARISON:  Chest radiograph May 01, 2018 FINDINGS: Cardiomediastinal silhouette is normal. Calcified aortic arch. No pleural effusions or focal consolidations. Mild chronic interstitial changes. Trachea projects midline and there is no pneumothorax. Soft tissue planes and included osseous structures are non-suspicious. Tunneled RIGHT dialysis catheter and tracheostomy tube in situ. IMPRESSION: 1. Chronic interstitial changes, no acute cardiopulmonary process. 2.  Aortic Atherosclerosis (ICD10-I70.0). Electronically Signed   By: Sandie Ano  Bloomer M.D.   On: 05/04/2018 19:32   Dg Abd Portable 1v  Result Date: 05/05/2018 CLINICAL DATA:  Assess for ileus EXAM: PORTABLE ABDOMEN - 1 VIEW COMPARISON:  04/26/2018 FINDINGS: Bilateral double-J ureteral stents in place. Previously administered contrast is present throughout the colon.  Small bowel gas pattern is normal. Gastrostomy tube in place. IMPRESSION: Previously administered contrast is present throughout the colon. Small bowel gas pattern is normal. Electronically Signed   By: Nelson Chimes M.D.   On: 05/05/2018 11:27    Assessment/Plan Active Problems:   Tracheostomy tube present (HCC)   Stage 5 chronic kidney disease on chronic dialysis (HCC)   Encephalopathy acute   Acute on chronic respiratory failure with hypoxia (HCC)   Healthcare-associated pneumonia   Bleeding at insertion site   1. Acute on chronic respiratory failure with hypoxia continue ventilator support at this time.  Continue weaning per protocol as appropriate with patient's level of agitation. 2. Stage V chronic kidney disease.  Patient did not have dialysis yesterday per Dr. Holley Raring. 3. Chronic encephalopathy unchanged stable at this time 4. Healthcare associated pneumonia treated respiratory cultures have not resulted yet 5. Tracheostomy patient has not had any more bleeding at this time continue to monitor.   I have personally seen and evaluated the patient, evaluated laboratory and imaging results, formulated the assessment and plan and placed orders. The Patient requires high complexity decision making for assessment and support.  Case was discussed on Rounds with the Respiratory Therapy Staff  Allyne Gee, MD [facility] Pulmonary Critical Care Medicine Sleep Medicine

## 2018-05-06 LAB — CBC
HEMATOCRIT: 27.4 % — AB (ref 39.0–52.0)
HEMOGLOBIN: 8.3 g/dL — AB (ref 13.0–17.0)
MCH: 28.6 pg (ref 26.0–34.0)
MCHC: 30.3 g/dL (ref 30.0–36.0)
MCV: 94.5 fL (ref 80.0–100.0)
Platelets: 352 10*3/uL (ref 150–400)
RBC: 2.9 MIL/uL — ABNORMAL LOW (ref 4.22–5.81)
RDW: 13.9 % (ref 11.5–15.5)
WBC: 7.9 10*3/uL (ref 4.0–10.5)
nRBC: 0 % (ref 0.0–0.2)

## 2018-05-06 LAB — RENAL FUNCTION PANEL
Albumin: 2.2 g/dL — ABNORMAL LOW (ref 3.5–5.0)
Anion gap: 12 (ref 5–15)
BUN: 23 mg/dL — ABNORMAL HIGH (ref 6–20)
CHLORIDE: 99 mmol/L (ref 98–111)
CO2: 29 mmol/L (ref 22–32)
Calcium: 8.6 mg/dL — ABNORMAL LOW (ref 8.9–10.3)
Creatinine, Ser: 3.77 mg/dL — ABNORMAL HIGH (ref 0.61–1.24)
GFR calc Af Amer: 19 mL/min — ABNORMAL LOW (ref >60)
GFR calc non Af Amer: 16 mL/min — ABNORMAL LOW (ref >60)
Glucose, Bld: 94 mg/dL (ref 70–99)
POTASSIUM: 3.8 mmol/L (ref 3.5–5.1)
Phosphorus: 3.4 mg/dL (ref 2.5–4.6)
Sodium: 140 mmol/L (ref 135–145)

## 2018-05-06 LAB — CULTURE, RESPIRATORY W GRAM STAIN

## 2018-05-06 LAB — CULTURE, RESPIRATORY

## 2018-05-06 NOTE — Progress Notes (Signed)
Pulmonary Critical Care Medicine [place]   PULMONARY CRITICAL CARE SERVICE  PROGRESS NOTE  Date of Service: 05/06/2018  Jimmy Martin  OZD:[id number]  DOB: 22-Aug-1955   DOA: 04/26/2018  Referring Physician: Merton Border, MD  HPI: Jimmy Martin is a 62 y.o. male seen for follow up of Acute on Chronic Respiratory Failure.  Patient is comfortable right now without distress.  On assist control mode has been on 28% oxygen  Medications: Reviewed on Rounds  Physical Exam:  Vitals: Temperature 98.6 pulse 117 respiratory rate 50 blood pressure 171/102 saturations 100%  Ventilator Settings mode ventilation assist control FiO2 28% tidal volume 550 PEEP 5  . General: Comfortable at this time . Eyes: Grossly normal lids, irises & conjunctiva . ENT: grossly tongue is normal . Neck: no obvious mass . Cardiovascular: S1 S2 normal no gallop . Respiratory: No rhonchi or rales are noted . Abdomen: soft . Skin: no rash seen on limited exam . Musculoskeletal: not rigid . Psychiatric:unable to assess . Neurologic: no seizure no involuntary movements         Lab Data:   Basic Metabolic Panel: Recent Labs  Lab 04/30/18 0541 05/02/18 0505 05/04/18 0546 05/06/18 0632  NA 133* 135 137 140  K 3.9 3.5 3.5 3.8  CL 96* 98 98 99  CO2 22 24 25 29   GLUCOSE 118* 105* 120* 94  BUN 114* 77* 61* 23*  CREATININE 8.81* 6.77* 6.36* 3.77*  CALCIUM 7.8* 8.0* 8.0* 8.6*  PHOS 7.8* 5.8* 5.0* 3.4    ABG: No results for input(s): PHART, PCO2ART, PO2ART, HCO3, O2SAT in the last 168 hours.  Liver Function Tests: Recent Labs  Lab 04/30/18 0541 05/02/18 0505 05/04/18 0546 05/06/18 2595  ALBUMIN 1.7* 1.9* 1.7* 2.2*   No results for input(s): LIPASE, AMYLASE in the last 168 hours. No results for input(s): AMMONIA in the last 168 hours.  CBC: Recent Labs  Lab 04/30/18 0541 05/02/18 0505 05/04/18 0546 05/06/18 0632  WBC 6.0 7.8 6.5 7.9  HGB 6.9* 7.8* 7.6* 8.3*  HCT  21.6* 24.2* 23.8* 27.4*  MCV 93.1 92.7 92.6 94.5  PLT 165 206 252 352    Cardiac Enzymes: No results for input(s): CKTOTAL, CKMB, CKMBINDEX, TROPONINI in the last 168 hours.  BNP (last 3 results) No results for input(s): BNP in the last 8760 hours.  ProBNP (last 3 results) No results for input(s): PROBNP in the last 8760 hours.  Radiological Exams: Dg Chest Port 1 View  Result Date: 05/05/2018 CLINICAL DATA:  Weaning from ventilator EXAM: PORTABLE CHEST 1 VIEW COMPARISON:  One day prior FINDINGS: Dialysis catheter unchanged. Tracheostomy appropriately positioned. Normal heart size. Atherosclerosis in the transverse aorta. No pleural effusion or pneumothorax. Hyperinflation, without lobar consolidation. Chronic interstitial thickening with probable calcified granulomas, more conspicuous on the left. IMPRESSION: Hyperinflation, without acute disease. Aortic Atherosclerosis (ICD10-I70.0). Electronically Signed   By: Abigail Miyamoto M.D.   On: 05/05/2018 09:44   Dg Chest Port 1 View  Result Date: 05/04/2018 CLINICAL DATA:  Shortness of breath and cough. 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 EXAM: PORTABLE CHEST 1 VIEW COMPARISON:  Chest radiograph May 01, 2018 FINDINGS: Cardiomediastinal silhouette is normal. Calcified aortic arch. No pleural effusions or focal consolidations. Mild chronic interstitial changes. Trachea projects midline and there is no pneumothorax. Soft tissue planes and included osseous structures are non-suspicious. Tunneled RIGHT dialysis catheter and tracheostomy tube in situ. IMPRESSION: 1. Chronic interstitial changes, no acute cardiopulmonary process. 2.  Aortic Atherosclerosis (ICD10-I70.0). Electronically Signed   By: Elon Alas M.D.   On: 05/04/2018 19:32   Dg Abd Portable 1v  Result Date: 05/05/2018 CLINICAL DATA:  Assess for ileus EXAM: PORTABLE ABDOMEN - 1 VIEW COMPARISON:  04/26/2018 FINDINGS: Bilateral double-J ureteral stents in place.  Previously administered contrast is present throughout the colon. Small bowel gas pattern is normal. Gastrostomy tube in place. IMPRESSION: Previously administered contrast is present throughout the colon. Small bowel gas pattern is normal. Electronically Signed   By: Nelson Chimes M.D.   On: 05/05/2018 11:27    Assessment/Plan Active Problems:   Tracheostomy tube present (HCC)   Stage 5 chronic kidney disease on chronic dialysis (HCC)   Encephalopathy acute   Acute on chronic respiratory failure with hypoxia (HCC)   Healthcare-associated pneumonia   Bleeding at insertion site   1. Acute on chronic respiratory failure with hypoxia we will continue with full support continue pulmonary toilet secretion management.  Patient's mechanics are poor not able to wean 2. Stage V kidney disease on dialysis followed by nephrology continue supportive care 3. Acute encephalopathy grossly unchanged 4. Healthcare associated pneumonia treated we will follow 5. Bleeding from tracheal site resolved   I have personally seen and evaluated the patient, evaluated laboratory and imaging results, formulated the assessment and plan and placed orders. The Patient requires high complexity decision making for assessment and support.  Case was discussed on Rounds with the Respiratory Therapy Staff  Allyne Gee, MD [facility] Pulmonary Critical Care Medicine Sleep Medicine

## 2018-05-06 NOTE — Progress Notes (Signed)
[facility]  ROUNDING NOTE   Subjective:  Patient completed dialysis early this a.m. Blood pressure noted to be a bit high. Still on ventilatory support.   Objective:  Vital signs in last 24 hours:  Temperature 98.6 pulse 117 respirations 50 blood pressure 171/102  Physical Exam: General: Critically ill appearing  Head: Normocephalic, atraumatic. Moist oral mucosal membranes  Eyes: Anicteric  Neck: Tracheostomy in place  Lungs:  Scattered rhonchi, vent assisted  Heart: S1S2 no rubs  Abdomen:  Soft, nontender, bowel sounds present  Extremities: trace peripheral edema.  Neurologic: Awake, alert, following commands  Skin: No lesions  Access: R IJ PC    Basic Metabolic Panel: Recent Labs  Lab 04/30/18 0541 05/02/18 0505 05/04/18 0546 05/06/18 0632  NA 133* 135 137 140  K 3.9 3.5 3.5 3.8  CL 96* 98 98 99  CO2 22 24 25 29   GLUCOSE 118* 105* 120* 94  BUN 114* 77* 61* 23*  CREATININE 8.81* 6.77* 6.36* 3.77*  CALCIUM 7.8* 8.0* 8.0* 8.6*  PHOS 7.8* 5.8* 5.0* 3.4    Liver Function Tests: Recent Labs  Lab 04/30/18 0541 05/02/18 0505 05/04/18 0546 05/06/18 0632  ALBUMIN 1.7* 1.9* 1.7* 2.2*   No results for input(s): LIPASE, AMYLASE in the last 168 hours. No results for input(s): AMMONIA in the last 168 hours.  CBC: Recent Labs  Lab 04/30/18 0541 05/02/18 0505 05/04/18 0546 05/06/18 0632  WBC 6.0 7.8 6.5 7.9  HGB 6.9* 7.8* 7.6* 8.3*  HCT 21.6* 24.2* 23.8* 27.4*  MCV 93.1 92.7 92.6 94.5  PLT 165 206 252 352    Cardiac Enzymes: No results for input(s): CKTOTAL, CKMB, CKMBINDEX, TROPONINI in the last 168 hours.  BNP: Invalid input(s): POCBNP  CBG: No results for input(s): GLUCAP in the last 168 hours.  Microbiology: Results for orders placed or performed during the hospital encounter of 04/26/18  Culture, respiratory     Status: None   Collection Time: 05/01/18  3:34 PM  Result Value Ref Range Status   Specimen Description TRACHEAL  ASPIRATE  Final   Special Requests NONE  Final   Gram Stain   Final    MODERATE WBC PRESENT,BOTH PMN AND MONONUCLEAR MODERATE GRAM POSITIVE COCCI IN PAIRS FEW GRAM NEGATIVE RODS MODERATE GRAM POSITIVE RODS RARE SQUAMOUS EPITHELIAL CELLS PRESENT Performed at [facility], [place] [address], [place], [place] [address]    Culture FEW METHICILLIN RESISTANT STAPHYLOCOCCUS AUREUS  Final   Report Status 05/04/2018 FINAL  Final   Organism ID, Bacteria METHICILLIN RESISTANT STAPHYLOCOCCUS AUREUS  Final      Susceptibility   Methicillin resistant staphylococcus aureus - MIC*    CIPROFLOXACIN >=8 RESISTANT Resistant     ERYTHROMYCIN <=0.25 SENSITIVE Sensitive     GENTAMICIN <=0.5 SENSITIVE Sensitive     OXACILLIN >=4 RESISTANT Resistant     TETRACYCLINE <=1 SENSITIVE Sensitive     VANCOMYCIN <=0.5 SENSITIVE Sensitive     TRIMETH/SULFA <=10 SENSITIVE Sensitive     CLINDAMYCIN <=0.25 SENSITIVE Sensitive     RIFAMPIN <=0.5 SENSITIVE Sensitive     Inducible Clindamycin NEGATIVE Sensitive     * FEW METHICILLIN RESISTANT STAPHYLOCOCCUS AUREUS  Culture, respiratory (non-expectorated)     Status: None (Preliminary result)   Collection Time: 05/04/18  2:27 PM  Result Value Ref Range Status   Specimen Description TRACHEAL ASPIRATE  Final   Special Requests NONE  Final   Gram Stain   Final    FEW WBC PRESENT, PREDOMINANTLY PMN FEW  GRAM POSITIVE COCCI    Culture   Final    MODERATE STAPHYLOCOCCUS AUREUS CULTURE REINCUBATED FOR BETTER GROWTH Performed at [facility], [place] [address], [place], [place] [address]    Report Status PENDING  Incomplete    Coagulation Studies: No results for input(s): LABPROT, INR in the last 72 hours.  Urinalysis: No results for input(s): COLORURINE, LABSPEC, PHURINE, GLUCOSEU, HGBUR, BILIRUBINUR, KETONESUR, PROTEINUR, UROBILINOGEN, NITRITE, LEUKOCYTESUR in the last 72 hours.  Invalid input(s): APPERANCEUR    Imaging: Dg Chest Port 1 View  Result  Date: 05/05/2018 CLINICAL DATA:  Weaning from ventilator EXAM: PORTABLE CHEST 1 VIEW COMPARISON:  One day prior FINDINGS: Dialysis catheter unchanged. Tracheostomy appropriately positioned. Normal heart size. Atherosclerosis in the transverse aorta. No pleural effusion or pneumothorax. Hyperinflation, without lobar consolidation. Chronic interstitial thickening with probable calcified granulomas, more conspicuous on the left. IMPRESSION: Hyperinflation, without acute disease. Aortic Atherosclerosis (ICD10-I70.0). Electronically Signed   By: Abigail Miyamoto M.D.   On: 05/05/2018 09:44   Dg Chest Port 1 View  Result Date: 05/04/2018 CLINICAL DATA:  Shortness of breath and cough. 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 EXAM: PORTABLE CHEST 1 VIEW COMPARISON:  Chest radiograph May 01, 2018 FINDINGS: Cardiomediastinal silhouette is normal. Calcified aortic arch. No pleural effusions or focal consolidations. Mild chronic interstitial changes. Trachea projects midline and there is no pneumothorax. Soft tissue planes and included osseous structures are non-suspicious. Tunneled RIGHT dialysis catheter and tracheostomy tube in situ. IMPRESSION: 1. Chronic interstitial changes, no acute cardiopulmonary process. 2.  Aortic Atherosclerosis (ICD10-I70.0). Electronically Signed   By: Elon Alas M.D.   On: 05/04/2018 19:32   Dg Abd Portable 1v  Result Date: 05/05/2018 CLINICAL DATA:  Assess for ileus EXAM: PORTABLE ABDOMEN - 1 VIEW COMPARISON:  04/26/2018 FINDINGS: Bilateral double-J ureteral stents in place. Previously administered contrast is present throughout the colon. Small bowel gas pattern is normal. Gastrostomy tube in place. IMPRESSION: Previously administered contrast is present throughout the colon. Small bowel gas pattern is normal. Electronically Signed   By: Nelson Chimes M.D.   On: 05/05/2018 11:27     Medications:       Assessment/ Plan:  62 y.o. male with a PMHx of ESRD  on HD previously on PD, R IJ PC placement, airway obstruction status post tracheostomy placement, squamous cell carcinoma of the larynx, acute on chronic respiratory failure, COPD, pneumonia, atrial fibrillation, status post PEG tube placement, who was admitted to [facility] on 04/22/2018 for ongoing management.  1.  ESRD on HD.    Patient completed dialysis early this a.m.  We will plan for dialysis again on Wednesday.  2.  Acute respiratory failure.    Patient maintained on vent support.  Continue to monitor percent FiO2 on the vent.  3.  Secondary hyperparathyroidism.    Phosphorus 3.4 and acceptable.  4.  Anemia of chronic kidney disease.  Hemoglobin up to 8.3.  Recently received blood transfusion. Start on retacrit 10000 units IV with HD.    LOS: 0 Matteus Mcnelly 12/9/20198:33 AM

## 2018-05-07 NOTE — Progress Notes (Signed)
Pulmonary Critical Care Medicine [place]   PULMONARY CRITICAL CARE SERVICE  PROGRESS NOTE  Date of Service: 05/07/2018  Jimmy Martin  STM:[id number]  DOB: 1955/11/15   DOA: 04/26/2018  Referring Physician: Merton Border, MD  HPI: Jimmy Martin is a 62 y.o. male seen for follow up of Acute on Chronic Respiratory Failure.  Patient currently is on assist control mode has been on 28% oxygen with a PEEP of 5  Medications: Reviewed on Rounds  Physical Exam:  Vitals: Temperature 98.4 pulse 88 respiratory rate 22 blood pressure 134/80 saturations 94%  Ventilator Settings mode ventilation assist control FiO2 28% tidal volume 310 PEEP 5  . General: Comfortable at this time . Eyes: Grossly normal lids, irises & conjunctiva . ENT: grossly tongue is normal . Neck: no obvious mass . Cardiovascular: S1 S2 normal no gallop . Respiratory: Coarse rhonchi expansion equal . Abdomen: soft . Skin: no rash seen on limited exam . Musculoskeletal: not rigid . Psychiatric:unable to assess . Neurologic: no seizure no involuntary movements         Lab Data:   Basic Metabolic Panel: Recent Labs  Lab 05/02/18 0505 05/04/18 0546 05/06/18 0632  NA 135 137 140  K 3.5 3.5 3.8  CL 98 98 99  CO2 24 25 29   GLUCOSE 105* 120* 94  BUN 77* 61* 23*  CREATININE 6.77* 6.36* 3.77*  CALCIUM 8.0* 8.0* 8.6*  PHOS 5.8* 5.0* 3.4    ABG: No results for input(s): PHART, PCO2ART, PO2ART, HCO3, O2SAT in the last 168 hours.  Liver Function Tests: Recent Labs  Lab 05/02/18 0505 05/04/18 0546 05/06/18 8921  ALBUMIN 1.9* 1.7* 2.2*   No results for input(s): LIPASE, AMYLASE in the last 168 hours. No results for input(s): AMMONIA in the last 168 hours.  CBC: Recent Labs  Lab 05/02/18 0505 05/04/18 0546 05/06/18 0632  WBC 7.8 6.5 7.9  HGB 7.8* 7.6* 8.3*  HCT 24.2* 23.8* 27.4*  MCV 92.7 92.6 94.5  PLT 206 252 352    Cardiac Enzymes: No results for input(s): CKTOTAL,  CKMB, CKMBINDEX, TROPONINI in the last 168 hours.  BNP (last 3 results) No results for input(s): BNP in the last 8760 hours.  ProBNP (last 3 results) No results for input(s): PROBNP in the last 8760 hours.  Radiological Exams: No results found.  Assessment/Plan Active Problems:   Tracheostomy tube present (HCC)   Stage 5 chronic kidney disease on chronic dialysis (HCC)   Encephalopathy acute   Acute on chronic respiratory failure with hypoxia (HCC)   Healthcare-associated pneumonia   Bleeding at insertion site   1. Acute on chronic respiratory failure with hypoxia patient was attempted on spontaneous breathing trial however had increased respiratory rate and work of breathing noted so was placed back on the ventilator.  We will continue to assess 2. Stage V kidney disease on chronic dialysis followed by nephrology. 3. Acute encephalopathy slowly improving 4. Healthcare associated pneumonia significantly shows reduction in sputum today 5. Bleeding at the tracheostomy site improved we will continue to monitor   I have personally seen and evaluated the patient, evaluated laboratory and imaging results, formulated the assessment and plan and placed orders. The Patient requires high complexity decision making for assessment and support.  Case was discussed on Rounds with the Respiratory Therapy Staff  Allyne Gee, MD [facility] Pulmonary Critical Care Medicine Sleep Medicine

## 2018-05-08 DIAGNOSIS — Z9911 Dependence on respirator [ventilator] status: Secondary | ICD-10-CM

## 2018-05-08 LAB — RENAL FUNCTION PANEL
Albumin: 2.1 g/dL — ABNORMAL LOW (ref 3.5–5.0)
Anion gap: 14 (ref 5–15)
BUN: 42 mg/dL — ABNORMAL HIGH (ref 6–20)
CALCIUM: 8.2 mg/dL — AB (ref 8.9–10.3)
CO2: 24 mmol/L (ref 22–32)
Chloride: 100 mmol/L (ref 98–111)
Creatinine, Ser: 7.54 mg/dL — ABNORMAL HIGH (ref 0.61–1.24)
GFR calc Af Amer: 8 mL/min — ABNORMAL LOW (ref >60)
GFR calc non Af Amer: 7 mL/min — ABNORMAL LOW (ref >60)
Glucose, Bld: 86 mg/dL (ref 70–99)
POTASSIUM: 3.9 mmol/L (ref 3.5–5.1)
Phosphorus: 5.2 mg/dL — ABNORMAL HIGH (ref 2.5–4.6)
Sodium: 138 mmol/L (ref 135–145)

## 2018-05-08 LAB — CBC
HCT: 26.8 % — ABNORMAL LOW (ref 39.0–52.0)
Hemoglobin: 7.9 g/dL — ABNORMAL LOW (ref 13.0–17.0)
MCH: 28.3 pg (ref 26.0–34.0)
MCHC: 29.5 g/dL — ABNORMAL LOW (ref 30.0–36.0)
MCV: 96.1 fL (ref 80.0–100.0)
Platelets: 373 10*3/uL (ref 150–400)
RBC: 2.79 MIL/uL — ABNORMAL LOW (ref 4.22–5.81)
RDW: 13.5 % (ref 11.5–15.5)
WBC: 6.8 10*3/uL (ref 4.0–10.5)
nRBC: 0 % (ref 0.0–0.2)

## 2018-05-08 NOTE — Progress Notes (Signed)
[facility]  ROUNDING NOTE   Subjective:  Patient seen and evaluated during hemodialysis. Very agitated. Heart rate in the 150s. Respiration rate also high at 30. Discussed with Dr. Owens Shark and we will start the patient on a Precedex drip   Objective:  Vital signs in last 24 hours:  Temperature 98.3 pulse 150 respirations 16 blood pressure 161/93  Physical Exam: General: Critically ill appearing  Head: Normocephalic, atraumatic. Moist oral mucosal membranes  Eyes: Anicteric  Neck: Tracheostomy in place  Lungs:  Scattered rhonchi, vent assisted  Heart: S1S2 no rubs  Abdomen:  Soft, nontender, bowel sounds present  Extremities: trace peripheral edema.  Neurologic: Awake, very anxious  Skin: No lesions  Access: R IJ PC    Basic Metabolic Panel: Recent Labs  Lab 05/02/18 0505 05/04/18 0546 05/06/18 0632 05/08/18 0602  NA 135 137 140 138  K 3.5 3.5 3.8 3.9  CL 98 98 99 100  CO2 24 25 29 24   GLUCOSE 105* 120* 94 86  BUN 77* 61* 23* 42*  CREATININE 6.77* 6.36* 3.77* 7.54*  CALCIUM 8.0* 8.0* 8.6* 8.2*  PHOS 5.8* 5.0* 3.4 5.2*    Liver Function Tests: Recent Labs  Lab 05/02/18 0505 05/04/18 0546 05/06/18 0632 05/08/18 0602  ALBUMIN 1.9* 1.7* 2.2* 2.1*   No results for input(s): LIPASE, AMYLASE in the last 168 hours. No results for input(s): AMMONIA in the last 168 hours.  CBC: Recent Labs  Lab 05/02/18 0505 05/04/18 0546 05/06/18 0632 05/08/18 0602  WBC 7.8 6.5 7.9 6.8  HGB 7.8* 7.6* 8.3* 7.9*  HCT 24.2* 23.8* 27.4* 26.8*  MCV 92.7 92.6 94.5 96.1  PLT 206 252 352 373    Cardiac Enzymes: No results for input(s): CKTOTAL, CKMB, CKMBINDEX, TROPONINI in the last 168 hours.  BNP: Invalid input(s): POCBNP  CBG: No results for input(s): GLUCAP in the last 168 hours.  Microbiology: Results for orders placed or performed during the hospital encounter of 04/26/18  Culture, respiratory     Status: None   Collection Time: 05/01/18  3:34 PM   Result Value Ref Range Status   Specimen Description TRACHEAL ASPIRATE  Final   Special Requests NONE  Final   Gram Stain   Final    MODERATE WBC PRESENT,BOTH PMN AND MONONUCLEAR MODERATE GRAM POSITIVE COCCI IN PAIRS FEW GRAM NEGATIVE RODS MODERATE GRAM POSITIVE RODS RARE SQUAMOUS EPITHELIAL CELLS PRESENT Performed at [facility], [place] [address] [place], [place] [address]    Culture FEW METHICILLIN RESISTANT STAPHYLOCOCCUS AUREUS  Final   Report Status 05/04/2018 FINAL  Final   Organism ID, Bacteria METHICILLIN RESISTANT STAPHYLOCOCCUS AUREUS  Final      Susceptibility   Methicillin resistant staphylococcus aureus - MIC*    CIPROFLOXACIN >=8 RESISTANT Resistant     ERYTHROMYCIN <=0.25 SENSITIVE Sensitive     GENTAMICIN <=0.5 SENSITIVE Sensitive     OXACILLIN >=4 RESISTANT Resistant     TETRACYCLINE <=1 SENSITIVE Sensitive     VANCOMYCIN <=0.5 SENSITIVE Sensitive     TRIMETH/SULFA <=10 SENSITIVE Sensitive     CLINDAMYCIN <=0.25 SENSITIVE Sensitive     RIFAMPIN <=0.5 SENSITIVE Sensitive     Inducible Clindamycin NEGATIVE Sensitive     * FEW METHICILLIN RESISTANT STAPHYLOCOCCUS AUREUS  Culture, respiratory (non-expectorated)     Status: None   Collection Time: 05/04/18  2:27 PM  Result Value Ref Range Status   Specimen Description TRACHEAL ASPIRATE  Final   Special Requests NONE  Final   Gram Stain  Final    FEW WBC PRESENT, PREDOMINANTLY PMN FEW GRAM POSITIVE COCCI    Culture   Final    MODERATE METHICILLIN RESISTANT STAPHYLOCOCCUS AUREUS   Report Status 05/06/2018 FINAL  Final   Organism ID, Bacteria METHICILLIN RESISTANT STAPHYLOCOCCUS AUREUS  Final      Susceptibility   Methicillin resistant staphylococcus aureus - MIC*    CIPROFLOXACIN >=8 RESISTANT Resistant     ERYTHROMYCIN <=0.25 SENSITIVE Sensitive     GENTAMICIN <=0.5 SENSITIVE Sensitive     OXACILLIN >=4 RESISTANT Resistant     TETRACYCLINE <=1 SENSITIVE Sensitive     VANCOMYCIN <=0.5 SENSITIVE  Sensitive     TRIMETH/SULFA <=10 SENSITIVE Sensitive     CLINDAMYCIN <=0.25 SENSITIVE Sensitive     RIFAMPIN <=0.5 SENSITIVE Sensitive     Inducible Clindamycin NEGATIVE Sensitive     * MODERATE METHICILLIN RESISTANT STAPHYLOCOCCUS AUREUS    Coagulation Studies: No results for input(s): LABPROT, INR in the last 72 hours.  Urinalysis: No results for input(s): COLORURINE, LABSPEC, PHURINE, GLUCOSEU, HGBUR, BILIRUBINUR, KETONESUR, PROTEINUR, UROBILINOGEN, NITRITE, LEUKOCYTESUR in the last 72 hours.  Invalid input(s): APPERANCEUR    Imaging: No results found.   Medications:       Assessment/ Plan:  62 y.o. 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  1.  ESRD on HD.    Patient seen and evaluated during hemodialysis.  Very anxious the heart rate in the 150s and respiratory rate greater than 30.  We will start the patient on a Precedex drip during dialysis to help keep him calm.  2.  Acute respiratory failure.    Continue current ventilatory support.  3.  Secondary hyperparathyroidism.    Is currently 5.2 and within target range.  4.  Anemia of chronic kidney disease.  Continue the patient on erythropoietin stimulating agents but hemoglobin currently 7.9.   LOS: 0 Ivaan Liddy 12/11/201911:06 AM

## 2018-05-08 NOTE — Progress Notes (Signed)
Pulmonary Critical Care Medicine [place]   PULMONARY CRITICAL CARE SERVICE  PROGRESS NOTE  Date of Service: 05/08/2018  Jimmy Martin  JQZ:[id number]  DOB: May 14, 1956   DOA: 04/26/2018  Referring Physician: Merton Border, MD  HPI: Jimmy Martin is a 62 y.o. male seen for follow up of Acute on Chronic Respiratory Failure.  Patient right now is on full support on assist control mode had some issues with increasing agitation not able to do any weaning at this time patient's heart rate is also elevated because of the anxiety  Medications: Reviewed on Rounds  Physical Exam:  Vitals: Temperature 98.3 pulse 80 respiratory rate 16 blood pressure 161/93 saturations 90%  Ventilator Settings mode of ventilation assist control FiO2 28% tidal volume 493 respiratory rate was 45 PEEP 5  . General: Comfortable at this time . Eyes: Grossly normal lids, irises & conjunctiva . ENT: grossly tongue is normal . Neck: no obvious mass . Cardiovascular: S1 S2 normal no gallop . Respiratory: Coarse breath sounds noted bilaterally . Abdomen: soft . Skin: no rash seen on limited exam . Musculoskeletal: not rigid . Psychiatric:unable to assess . Neurologic: no seizure no involuntary movements         Lab Data:   Basic Metabolic Panel: Recent Labs  Lab 05/02/18 0505 05/04/18 0546 05/06/18 0632 05/08/18 0602  NA 135 137 140 138  K 3.5 3.5 3.8 3.9  CL 98 98 99 100  CO2 24 25 29 24   GLUCOSE 105* 120* 94 86  BUN 77* 61* 23* 42*  CREATININE 6.77* 6.36* 3.77* 7.54*  CALCIUM 8.0* 8.0* 8.6* 8.2*  PHOS 5.8* 5.0* 3.4 5.2*    ABG: No results for input(s): PHART, PCO2ART, PO2ART, HCO3, O2SAT in the last 168 hours.  Liver Function Tests: Recent Labs  Lab 05/02/18 0505 05/04/18 0546 05/06/18 0632 05/08/18 0602  ALBUMIN 1.9* 1.7* 2.2* 2.1*   No results for input(s): LIPASE, AMYLASE in the last 168 hours. No results for input(s): AMMONIA in the last 168  hours.  CBC: Recent Labs  Lab 05/02/18 0505 05/04/18 0546 05/06/18 0632 05/08/18 0602  WBC 7.8 6.5 7.9 6.8  HGB 7.8* 7.6* 8.3* 7.9*  HCT 24.2* 23.8* 27.4* 26.8*  MCV 92.7 92.6 94.5 96.1  PLT 206 252 352 373    Cardiac Enzymes: No results for input(s): CKTOTAL, CKMB, CKMBINDEX, TROPONINI in the last 168 hours.  BNP (last 3 results) No results for input(s): BNP in the last 8760 hours.  ProBNP (last 3 results) No results for input(s): PROBNP in the last 8760 hours.  Radiological Exams: No results found.  Assessment/Plan Active Problems:   Tracheostomy tube present (HCC)   Stage 5 chronic kidney disease on chronic dialysis (HCC)   Encephalopathy acute   Acute on chronic respiratory failure with hypoxia (HCC)   Healthcare-associated pneumonia   Bleeding at insertion site   1. Acute on chronic respiratory failure with hypoxia we will continue with full vent support patient is not ready for any weaning at this time.  Needs better anxiety control.  In addition the patient's brother was here they want him transferred to [facility] because that is where all of his physicians are.  This is the first time that I am hearing about this however the primary care team is aware and they have informed me that they are waiting for a phone call from the physician that the brother has been speaking with.  The primary care team has  given them the phone number to call so as to coordinate possibility of transfer. 2. Stage V kidney disease on chronic hemodialysis patient is being seen by nephrology for dialysis. 3. Acute encephalopathy as noted above patient does remain confused needs better medical control we are working on this. 4. Healthcare associated pneumonia treated last chest x-ray reviewed it with the brother and the last chest x-ray is basically clear 5. Tracheostomy with bleeding no active bleeding is noted at this time.   I have personally seen and evaluated  the patient, evaluated laboratory and imaging results, formulated the assessment and plan and placed orders.  Time 35 minutes with that discussion with the family and review of the chart radiological studies The Patient requires high complexity decision making for assessment and support.  Case was discussed on Rounds with the Respiratory Therapy Staff  Allyne Gee, MD [facility] Pulmonary Critical Care Medicine Sleep Medicine

## 2018-05-09 NOTE — Progress Notes (Signed)
Pulmonary Critical Care Medicine [place]   PULMONARY CRITICAL CARE SERVICE  PROGRESS NOTE  Date of Service: 05/09/2018  Jimmy Martin  [id number]:[id number]  DOB: [birth year]   DOA: 04/26/2018  Referring Physician: Merton Border, MD  HPI: Jimmy Martin is a 62 y.o. male seen for follow up of Acute on Chronic Respiratory Failure.  Patient right now is on full support on assist control mode he has not been tried on T collar trials and I spoke with respiratory therapy on rounds we will give him a trial of T collar.  I think he might do better he is gets more agitated while he is on the ventilator.  Medications: Reviewed on Rounds  Physical Exam:  Vitals: Temperature 97.7 pulse 73 respiratory rate 16 blood pressure 118/69 saturations 100%  Ventilator Settings mode ventilation assist control FiO2 28% tidal volume 468 PEEP 5  . General: Comfortable at this time . Eyes: Grossly normal lids, irises & conjunctiva . ENT: grossly tongue is normal . Neck: no obvious mass . Cardiovascular: S1 S2 normal no gallop . Respiratory: Coarse rhonchi expansion is equal . Abdomen: soft . Skin: no rash seen on limited exam . Musculoskeletal: not rigid . Psychiatric:unable to assess . Neurologic: no seizure no involuntary movements         Lab Data:   Basic Metabolic Panel: Recent Labs  Lab 05/04/18 0546 05/06/18 0632 05/08/18 0602  NA 137 140 138  K 3.5 3.8 3.9  CL 98 99 100  CO2 25 29 24   GLUCOSE 120* 94 86  BUN 61* 23* 42*  CREATININE 6.36* 3.77* 7.54*  CALCIUM 8.0* 8.6* 8.2*  PHOS 5.0* 3.4 5.2*    ABG: No results for input(s): PHART, PCO2ART, PO2ART, HCO3, O2SAT in the last 168 hours.  Liver Function Tests: Recent Labs  Lab 05/04/18 0546 05/06/18 0632 05/08/18 0602  ALBUMIN 1.7* 2.2* 2.1*   No results for input(s): LIPASE, AMYLASE in the last 168 hours. No results for input(s): AMMONIA in the last 168 hours.  CBC: Recent Labs  Lab 05/04/18 0546  05/06/18 0632 05/08/18 0602  WBC 6.5 7.9 6.8  HGB 7.6* 8.3* 7.9*  HCT 23.8* 27.4* 26.8*  MCV 92.6 94.5 96.1  PLT 252 352 373    Cardiac Enzymes: No results for input(s): CKTOTAL, CKMB, CKMBINDEX, TROPONINI in the last 168 hours.  BNP (last 3 results) No results for input(s): BNP in the last 8760 hours.  ProBNP (last 3 results) No results for input(s): PROBNP in the last 8760 hours.  Radiological Exams: No results found.  Assessment/Plan Active Problems:   Tracheostomy tube present (HCC)   Stage 5 chronic kidney disease on chronic dialysis (HCC)   Encephalopathy acute   Acute on chronic respiratory failure with hypoxia (HCC)   Healthcare-associated pneumonia   Bleeding at insertion site   1. Acute on chronic respiratory failure with hypoxia we will place the patient on T collar.  Will monitor closely continue with aggressive pulmonary toilet and supportive care. 2. Stage V chronic kidney disease on dialysis followed by nephrology. 3. Encephalopathy grossly unchanged 4. Healthcare associated pneumonia treated improved 5. Bleeding from tracheostomy stabilized no further bleeding noted   I have personally seen and evaluated the patient, evaluated laboratory and imaging results, formulated the assessment and plan and placed orders. The Patient requires high complexity decision making for assessment and support.  Case was discussed on Rounds with the Respiratory Therapy Staff  Allyne Gee, MD [facility] Pulmonary Critical Care  Medicine Sleep Medicine

## 2018-05-10 LAB — RENAL FUNCTION PANEL
Albumin: 2.2 g/dL — ABNORMAL LOW (ref 3.5–5.0)
Anion gap: 14 (ref 5–15)
BUN: 37 mg/dL — AB (ref 6–20)
CO2: 24 mmol/L (ref 22–32)
CREATININE: 6.29 mg/dL — AB (ref 0.61–1.24)
Calcium: 8.3 mg/dL — ABNORMAL LOW (ref 8.9–10.3)
Chloride: 99 mmol/L (ref 98–111)
GFR calc Af Amer: 10 mL/min — ABNORMAL LOW (ref >60)
GFR calc non Af Amer: 9 mL/min — ABNORMAL LOW (ref >60)
GLUCOSE: 92 mg/dL (ref 70–99)
Phosphorus: 6.3 mg/dL — ABNORMAL HIGH (ref 2.5–4.6)
Potassium: 4.1 mmol/L (ref 3.5–5.1)
Sodium: 137 mmol/L (ref 135–145)

## 2018-05-10 LAB — CBC
HCT: 31.6 % — ABNORMAL LOW (ref 39.0–52.0)
Hemoglobin: 9.8 g/dL — ABNORMAL LOW (ref 13.0–17.0)
MCH: 29.4 pg (ref 26.0–34.0)
MCHC: 31 g/dL (ref 30.0–36.0)
MCV: 94.9 fL (ref 80.0–100.0)
Platelets: 384 10*3/uL (ref 150–400)
RBC: 3.33 MIL/uL — ABNORMAL LOW (ref 4.22–5.81)
RDW: 13.4 % (ref 11.5–15.5)
WBC: 11.1 10*3/uL — ABNORMAL HIGH (ref 4.0–10.5)
nRBC: 0 % (ref 0.0–0.2)

## 2018-05-10 LAB — C DIFFICILE QUICK SCREEN W PCR REFLEX
C Diff antigen: NEGATIVE
C Diff interpretation: NOT DETECTED
C Diff toxin: NEGATIVE

## 2018-05-10 NOTE — Progress Notes (Signed)
[facility]  ROUNDING NOTE   Subjective:  Patient seen and evaluated during hemodialysis. Tolerating well.    Objective:  Vital signs in last 24 hours:  Temperature 98.3 pulse 66 respirations 23 blood pressure 146/81  Physical Exam: General: Critically ill appearing  Head: Normocephalic, atraumatic. Moist oral mucosal membranes  Eyes: Anicteric  Neck: Tracheostomy in place  Lungs:  Scattered rhonchi, vent assisted  Heart: S1S2 no rubs  Abdomen:  Soft, nontender, bowel sounds present  Extremities: trace peripheral edema.  Neurologic: Awake, very anxious  Skin: No lesions  Access: R IJ PC    Basic Metabolic Panel: Recent Labs  Lab 05/04/18 0546 05/06/18 0632 05/08/18 0602 05/10/18 0606  NA 137 140 138 137  K 3.5 3.8 3.9 4.1  CL 98 99 100 99  CO2 25 29 24 24   GLUCOSE 120* 94 86 92  BUN 61* 23* 42* 37*  CREATININE 6.36* 3.77* 7.54* 6.29*  CALCIUM 8.0* 8.6* 8.2* 8.3*  PHOS 5.0* 3.4 5.2* 6.3*    Liver Function Tests: Recent Labs  Lab 05/04/18 0546 05/06/18 0632 05/08/18 0602 05/10/18 0606  ALBUMIN 1.7* 2.2* 2.1* 2.2*   No results for input(s): LIPASE, AMYLASE in the last 168 hours. No results for input(s): AMMONIA in the last 168 hours.  CBC: Recent Labs  Lab 05/04/18 0546 05/06/18 0632 05/08/18 0602 05/10/18 0606  WBC 6.5 7.9 6.8 11.1*  HGB 7.6* 8.3* 7.9* 9.8*  HCT 23.8* 27.4* 26.8* 31.6*  MCV 92.6 94.5 96.1 94.9  PLT 252 352 373 384    Cardiac Enzymes: No results for input(s): CKTOTAL, CKMB, CKMBINDEX, TROPONINI in the last 168 hours.  BNP: Invalid input(s): POCBNP  CBG: No results for input(s): GLUCAP in the last 168 hours.  Microbiology: Results for orders placed or performed during the hospital encounter of 04/26/18  Culture, respiratory     Status: None   Collection Time: 05/01/18  3:34 PM  Result Value Ref Range Status   Specimen Description TRACHEAL ASPIRATE  Final   Special Requests NONE  Final   Gram Stain    Final    MODERATE WBC PRESENT,BOTH PMN AND MONONUCLEAR MODERATE GRAM POSITIVE COCCI IN PAIRS FEW GRAM NEGATIVE RODS MODERATE GRAM POSITIVE RODS RARE SQUAMOUS EPITHELIAL CELLS PRESENT Performed at [facility], [place] [address], [place], [place] [address]    Culture FEW METHICILLIN RESISTANT STAPHYLOCOCCUS AUREUS  Final   Report Status 05/04/2018 FINAL  Final   Organism ID, Bacteria METHICILLIN RESISTANT STAPHYLOCOCCUS AUREUS  Final      Susceptibility   Methicillin resistant staphylococcus aureus - MIC*    CIPROFLOXACIN >=8 RESISTANT Resistant     ERYTHROMYCIN <=0.25 SENSITIVE Sensitive     GENTAMICIN <=0.5 SENSITIVE Sensitive     OXACILLIN >=4 RESISTANT Resistant     TETRACYCLINE <=1 SENSITIVE Sensitive     VANCOMYCIN <=0.5 SENSITIVE Sensitive     TRIMETH/SULFA <=10 SENSITIVE Sensitive     CLINDAMYCIN <=0.25 SENSITIVE Sensitive     RIFAMPIN <=0.5 SENSITIVE Sensitive     Inducible Clindamycin NEGATIVE Sensitive     * FEW METHICILLIN RESISTANT STAPHYLOCOCCUS AUREUS  Culture, respiratory (non-expectorated)     Status: None   Collection Time: 05/04/18  2:27 PM  Result Value Ref Range Status   Specimen Description TRACHEAL ASPIRATE  Final   Special Requests NONE  Final   Gram Stain   Final    FEW WBC PRESENT, PREDOMINANTLY PMN FEW GRAM POSITIVE COCCI    Culture   Final  MODERATE METHICILLIN RESISTANT STAPHYLOCOCCUS AUREUS   Report Status 05/06/2018 FINAL  Final   Organism ID, Bacteria METHICILLIN RESISTANT STAPHYLOCOCCUS AUREUS  Final      Susceptibility   Methicillin resistant staphylococcus aureus - MIC*    CIPROFLOXACIN >=8 RESISTANT Resistant     ERYTHROMYCIN <=0.25 SENSITIVE Sensitive     GENTAMICIN <=0.5 SENSITIVE Sensitive     OXACILLIN >=4 RESISTANT Resistant     TETRACYCLINE <=1 SENSITIVE Sensitive     VANCOMYCIN <=0.5 SENSITIVE Sensitive     TRIMETH/SULFA <=10 SENSITIVE Sensitive     CLINDAMYCIN <=0.25 SENSITIVE Sensitive     RIFAMPIN <=0.5 SENSITIVE  Sensitive     Inducible Clindamycin NEGATIVE Sensitive     * MODERATE METHICILLIN RESISTANT STAPHYLOCOCCUS AUREUS    Coagulation Studies: No results for input(s): LABPROT, INR in the last 72 hours.  Urinalysis: No results for input(s): COLORURINE, LABSPEC, PHURINE, GLUCOSEU, HGBUR, BILIRUBINUR, KETONESUR, PROTEINUR, UROBILINOGEN, NITRITE, LEUKOCYTESUR in the last 72 hours.  Invalid input(s): APPERANCEUR    Imaging: No results found.   Medications:       Assessment/ Plan:  62 y.o. 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  1.  ESRD on HD.    Patient seen and evaluated during hemodialysis.  Tolerating well.  Complete dialysis today and next dialysis treatment will be on Monday.  2.  Acute respiratory failure.    Patient tolerating ventilatory support well at the moment.  3.  Secondary hyperparathyroidism.    Phosphorus a bit high today at 6.3.  Should come down with dialysis treatment today.  4.  Anemia of chronic kidney disease.  Hemoglobin up to 9.8 which is a good sign.  Continue to monitor.   LOS: 0 Armando Lauman 12/13/20198:34 AM

## 2018-05-10 NOTE — Progress Notes (Signed)
Pulmonary Critical Care Medicine [place]   PULMONARY CRITICAL CARE SERVICE  PROGRESS NOTE  Date of Service: 05/10/2018  Jimmy Martin  QVZ:[id number]  DOB: 03/28/1956   DOA: 04/26/2018  Referring Physician: Merton Border, MD  HPI: Jimmy Martin is a 62 y.o. male seen for follow up of Acute on Chronic Respiratory Failure.  Patient right now is on T collar was for 16-hour goal patient will be resting on the ventilator afterwards  Medications: Reviewed on Rounds  Physical Exam:  Vitals: Temperature 98.5 pulse 66 respiratory rate 23 blood pressure 146/81 saturations 100%  Ventilator Settings mode of ventilation assist control FiO2 28% tidal volume 372 PEEP 5  . General: Comfortable at this time . Eyes: Grossly normal lids, irises & conjunctiva . ENT: grossly tongue is normal . Neck: no obvious mass . Cardiovascular: S1 S2 normal no gallop . Respiratory: No rhonchi no rales . Abdomen: soft . Skin: no rash seen on limited exam . Musculoskeletal: not rigid . Psychiatric:unable to assess . Neurologic: no seizure no involuntary movements         Lab Data:   Basic Metabolic Panel: Recent Labs  Lab 05/04/18 0546 05/06/18 0632 05/08/18 0602 05/10/18 0606  NA 137 140 138 137  K 3.5 3.8 3.9 4.1  CL 98 99 100 99  CO2 25 29 24 24   GLUCOSE 120* 94 86 92  BUN 61* 23* 42* 37*  CREATININE 6.36* 3.77* 7.54* 6.29*  CALCIUM 8.0* 8.6* 8.2* 8.3*  PHOS 5.0* 3.4 5.2* 6.3*    ABG: No results for input(s): PHART, PCO2ART, PO2ART, HCO3, O2SAT in the last 168 hours.  Liver Function Tests: Recent Labs  Lab 05/04/18 0546 05/06/18 3295 05/08/18 0602 05/10/18 0606  ALBUMIN 1.7* 2.2* 2.1* 2.2*   No results for input(s): LIPASE, AMYLASE in the last 168 hours. No results for input(s): AMMONIA in the last 168 hours.  CBC: Recent Labs  Lab 05/04/18 0546 05/06/18 0632 05/08/18 0602 05/10/18 0606  WBC 6.5 7.9 6.8 11.1*  HGB 7.6* 8.3* 7.9* 9.8*  HCT 23.8*  27.4* 26.8* 31.6*  MCV 92.6 94.5 96.1 94.9  PLT 252 352 373 384    Cardiac Enzymes: No results for input(s): CKTOTAL, CKMB, CKMBINDEX, TROPONINI in the last 168 hours.  BNP (last 3 results) No results for input(s): BNP in the last 8760 hours.  ProBNP (last 3 results) No results for input(s): PROBNP in the last 8760 hours.  Radiological Exams: No results found.  Assessment/Plan Active Problems:   Tracheostomy tube present (HCC)   Stage 5 chronic kidney disease on chronic dialysis (HCC)   Encephalopathy acute   Acute on chronic respiratory failure with hypoxia (HCC)   Healthcare-associated pneumonia   Bleeding at insertion site   1. Acute on chronic respiratory failure with hypoxia patient was able to do 16 hours on the T collar plan is to advance it further.  We will continue with pulmonary toilet secretion management. 2. 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 3. Healthcare associated pneumonia treated we will continue to follow 4. Chronic encephalopathy slowly improving 5. Tracheostomy with bleeding resolved right now   I have personally seen and evaluated the patient, evaluated laboratory and imaging results, formulated the assessment and plan and placed orders. The Patient requires high complexity decision making for assessment and support.  Case was discussed on Rounds with the Respiratory Therapy Staff  Allyne Gee, MD [facility] Pulmonary Critical Care Medicine Sleep Medicine

## 2018-05-11 NOTE — Progress Notes (Addendum)
Pulmonary Critical Care Medicine [place]   PULMONARY CRITICAL CARE SERVICE  PROGRESS NOTE  Date of Service: 05/11/2018  CAYTON CUEVAS  EXN:[id number]  DOB: Dec 02, 1955   DOA: 04/26/2018  Referring Physician: Merton Border, MD  HPI: ZAYVION STAILEY is a 62 y.o. male seen for follow up of Acute on Chronic Respiratory Failure.  Patient is on trach collar currently 35%.  He returned from dialysis yesterday and was placed back on vent for a few hours.  Was immediately placed back on trach collar and has done well ever since.  Medications: Reviewed on Rounds  Physical Exam:  Vitals: Pulse of 67 respirations 24 blood pressure 107/65 O2 saturation 100% temp 98.1  Ventilator Settings patient's not currently on ventilator  . General: Comfortable at this time . Eyes: Grossly normal lids, irises & conjunctiva . ENT: grossly tongue is normal . Neck: no obvious mass . Cardiovascular: S1 S2 normal no gallop . Respiratory: Coarse breath sounds . Abdomen: soft . Skin: no rash seen on limited exam . Musculoskeletal: not rigid . Psychiatric:unable to assess . Neurologic: no seizure no involuntary movements         Lab Data:   Basic Metabolic Panel: Recent Labs  Lab 05/06/18 0632 05/08/18 0602 05/10/18 0606  NA 140 138 137  K 3.8 3.9 4.1  CL 99 100 99  CO2 29 24 24   GLUCOSE 94 86 92  BUN 23* 42* 37*  CREATININE 3.77* 7.54* 6.29*  CALCIUM 8.6* 8.2* 8.3*  PHOS 3.4 5.2* 6.3*    ABG: No results for input(s): PHART, PCO2ART, PO2ART, HCO3, O2SAT in the last 168 hours.  Liver Function Tests: Recent Labs  Lab 05/06/18 4967 05/08/18 0602 05/10/18 0606  ALBUMIN 2.2* 2.1* 2.2*   No results for input(s): LIPASE, AMYLASE in the last 168 hours. No results for input(s): AMMONIA in the last 168 hours.  CBC: Recent Labs  Lab 05/06/18 0632 05/08/18 0602 05/10/18 0606  WBC 7.9 6.8 11.1*  HGB 8.3* 7.9* 9.8*  HCT 27.4* 26.8* 31.6*  MCV 94.5 96.1 94.9  PLT  352 373 384    Cardiac Enzymes: No results for input(s): CKTOTAL, CKMB, CKMBINDEX, TROPONINI in the last 168 hours.  BNP (last 3 results) No results for input(s): BNP in the last 8760 hours.  ProBNP (last 3 results) No results for input(s): PROBNP in the last 8760 hours.  Radiological Exams: No results found.  Assessment/Plan Active Problems:   Tracheostomy tube present (HCC)   Stage 5 chronic kidney disease on chronic dialysis (HCC)   Encephalopathy acute   Acute on chronic respiratory failure with hypoxia (HCC)   Healthcare-associated pneumonia   Bleeding at insertion site   1. Acute on chronic respiratory failure with hypoxia patient is doing well on trach collar with minimal secretions.  Continue wean per protocol.  Also continue pulmonary toilet. 2. 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 Healthcare associated pneumonia treated, continue to follow 4. Chronic encephalopathy slowly improving 5. Tracheostomy bleeding has resolved at this time  This patient was seen by Orson Gear AGNP-C in Collaboration with Dr. Devona Konig as a part of collaborative care agreement.    I have personally seen and evaluated the patient, evaluated laboratory and imaging results, formulated the assessment and plan and placed orders. The Patient requires high complexity decision making for assessment and support.  Case was discussed on Rounds with the Respiratory Therapy Staff  Allyne Gee, MD [facility] Pulmonary Critical  Care Medicine Sleep Medicine

## 2018-05-12 NOTE — Progress Notes (Addendum)
Pulmonary Critical Care Medicine [place]   PULMONARY CRITICAL CARE SERVICE  PROGRESS NOTE  Date of Service: 05/12/2018  Jimmy Martin  WRU:[id number]  DOB: [birth year]   DOA: 04/26/2018  Referring Physician: Merton Border, MD  HPI: Jimmy Martin is a 62 y.o. male seen for follow up of Acute on Chronic Respiratory Failure.  Patient has now been on 28% FiO2 trach collar for 48 hours.  He continues to have minimal secretions.  And is currently doing well.  Medications: Reviewed on Rounds  Physical Exam:  Vitals: Pulse 74 respirations 20 blood pressure 115/69 O2 sat 100% temp 97.3  Ventilator Settings patient's not currently on ventilator  . General: Comfortable at this time . Eyes: Grossly normal lids, irises & conjunctiva . ENT: grossly tongue is normal . Neck: no obvious mass . Cardiovascular: S1 S2 normal no gallop . Respiratory: Coarse breath sounds . Abdomen: soft . Skin: no rash seen on limited exam . Musculoskeletal: not rigid . Psychiatric:unable to assess . Neurologic: no seizure no involuntary movements         Lab Data:   Basic Metabolic Panel: Recent Labs  Lab 05/06/18 0632 05/08/18 0602 05/10/18 0606  NA 140 138 137  K 3.8 3.9 4.1  CL 99 100 99  CO2 29 24 24   GLUCOSE 94 86 92  BUN 23* 42* 37*  CREATININE 3.77* 7.54* 6.29*  CALCIUM 8.6* 8.2* 8.3*  PHOS 3.4 5.2* 6.3*    ABG: No results for input(s): PHART, PCO2ART, PO2ART, HCO3, O2SAT in the last 168 hours.  Liver Function Tests: Recent Labs  Lab 05/06/18 9147 05/08/18 0602 05/10/18 0606  ALBUMIN 2.2* 2.1* 2.2*   No results for input(s): LIPASE, AMYLASE in the last 168 hours. No results for input(s): AMMONIA in the last 168 hours.  CBC: Recent Labs  Lab 05/06/18 0632 05/08/18 0602 05/10/18 0606  WBC 7.9 6.8 11.1*  HGB 8.3* 7.9* 9.8*  HCT 27.4* 26.8* 31.6*  MCV 94.5 96.1 94.9  PLT 352 373 384    Cardiac Enzymes: No results for input(s): CKTOTAL, CKMB,  CKMBINDEX, TROPONINI in the last 168 hours.  BNP (last 3 results) No results for input(s): BNP in the last 8760 hours.  ProBNP (last 3 results) No results for input(s): PROBNP in the last 8760 hours.  Radiological Exams: No results found.  Assessment/Plan Active Problems:   Tracheostomy tube present (HCC)   Stage 5 chronic kidney disease on chronic dialysis (HCC)   Encephalopathy acute   Acute on chronic respiratory failure with hypoxia (HCC)   Healthcare-associated pneumonia   Bleeding at insertion site   1. Acute on chronic respiratory failure with hypoxia patient doing well on trach collar minimal secretions.  Continue weaning per protocol.  Continue aggressive pulmonary toilet. 2. 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 Healthcare associated pneumonia treated continue to follow 4. Chronic encephalopathy slowly improving 5. Tracheostomy bleeding has resolved  This patient was seen by Orson Gear AGNP-C in Collaboration with Dr. Devona Konig as a part of collaborative care agreement.    I have personally seen and evaluated the patient, evaluated laboratory and imaging results, formulated the assessment and plan and placed orders. The Patient requires high complexity decision making for assessment and support.  Case was discussed on Rounds with the Respiratory Therapy Staff  Allyne Gee, MD [facility] Pulmonary Critical Care Medicine Sleep Medicine

## 2018-05-13 LAB — CBC
HCT: 33.7 % — ABNORMAL LOW (ref 39.0–52.0)
Hemoglobin: 10.1 g/dL — ABNORMAL LOW (ref 13.0–17.0)
MCH: 28.7 pg (ref 26.0–34.0)
MCHC: 30 g/dL (ref 30.0–36.0)
MCV: 95.7 fL (ref 80.0–100.0)
Platelets: 430 10*3/uL — ABNORMAL HIGH (ref 150–400)
RBC: 3.52 MIL/uL — ABNORMAL LOW (ref 4.22–5.81)
RDW: 13.8 % (ref 11.5–15.5)
WBC: 21.1 10*3/uL — AB (ref 4.0–10.5)
nRBC: 0 % (ref 0.0–0.2)

## 2018-05-13 LAB — RENAL FUNCTION PANEL
Albumin: 2.4 g/dL — ABNORMAL LOW (ref 3.5–5.0)
Anion gap: 16 — ABNORMAL HIGH (ref 5–15)
BUN: 52 mg/dL — ABNORMAL HIGH (ref 6–20)
CO2: 22 mmol/L (ref 22–32)
Calcium: 8.7 mg/dL — ABNORMAL LOW (ref 8.9–10.3)
Chloride: 101 mmol/L (ref 98–111)
Creatinine, Ser: 7.65 mg/dL — ABNORMAL HIGH (ref 0.61–1.24)
GFR calc Af Amer: 8 mL/min — ABNORMAL LOW (ref >60)
GFR calc non Af Amer: 7 mL/min — ABNORMAL LOW (ref >60)
Glucose, Bld: 107 mg/dL — ABNORMAL HIGH (ref 70–99)
Phosphorus: 5.1 mg/dL — ABNORMAL HIGH (ref 2.5–4.6)
Potassium: 3.7 mmol/L (ref 3.5–5.1)
Sodium: 139 mmol/L (ref 135–145)

## 2018-05-13 NOTE — Progress Notes (Signed)
Pulmonary Critical Care Medicine [place]   PULMONARY CRITICAL CARE SERVICE  PROGRESS NOTE  Date of Service: 05/13/2018  Jimmy Martin  MGQ:[id number]  DOB: 1955/08/18   DOA: 04/26/2018  Referring Physician: Merton Border, MD  HPI: Jimmy Martin is a 62 y.o. male seen for follow up of Acute on Chronic Respiratory Failure.  Comfortable right now is on T collar has been off the ventilator for more than 48 hours.  Patient's been requiring 28% FiO2  Medications: Reviewed on Rounds  Physical Exam:  Vitals: Temperature 97.3 pulse 100 respiratory rate 20 blood pressure 130/77 saturations are 100%  Ventilator Settings off the ventilator on T collar trials right now  . General: Comfortable at this time . Eyes: Grossly normal lids, irises & conjunctiva . ENT: grossly tongue is normal . Neck: no obvious mass . Cardiovascular: S1 S2 normal no gallop . Respiratory: Scattered rhonchi expansion is equal . Abdomen: soft . Skin: no rash seen on limited exam . Musculoskeletal: not rigid . Psychiatric:unable to assess . Neurologic: no seizure no involuntary movements         Lab Data:   Basic Metabolic Panel: Recent Labs  Lab 05/08/18 0602 05/10/18 0606 05/13/18 0455  NA 138 137 139  K 3.9 4.1 3.7  CL 100 99 101  CO2 24 24 22   GLUCOSE 86 92 107*  BUN 42* 37* 52*  CREATININE 7.54* 6.29* 7.65*  CALCIUM 8.2* 8.3* 8.7*  PHOS 5.2* 6.3* 5.1*    ABG: No results for input(s): PHART, PCO2ART, PO2ART, HCO3, O2SAT in the last 168 hours.  Liver Function Tests: Recent Labs  Lab 05/08/18 0602 05/10/18 0606 05/13/18 0455  ALBUMIN 2.1* 2.2* 2.4*   No results for input(s): LIPASE, AMYLASE in the last 168 hours. No results for input(s): AMMONIA in the last 168 hours.  CBC: Recent Labs  Lab 05/08/18 0602 05/10/18 0606 05/13/18 0455  WBC 6.8 11.1* 21.1*  HGB 7.9* 9.8* 10.1*  HCT 26.8* 31.6* 33.7*  MCV 96.1 94.9 95.7  PLT 373 384 430*    Cardiac  Enzymes: No results for input(s): CKTOTAL, CKMB, CKMBINDEX, TROPONINI in the last 168 hours.  BNP (last 3 results) No results for input(s): BNP in the last 8760 hours.  ProBNP (last 3 results) No results for input(s): PROBNP in the last 8760 hours.  Radiological Exams: No results found.  Assessment/Plan Active Problems:   Tracheostomy tube present (HCC)   Stage 5 chronic kidney disease on chronic dialysis (HCC)   Encephalopathy acute   Acute on chronic respiratory failure with hypoxia (HCC)   Healthcare-associated pneumonia   Bleeding at insertion site   1. Acute on chronic respiratory failure with hypoxia we will continue with T collar trials patient is doing well so far continue pulmonary toilet supportive care 2. Stage V kidney disease followed by nephrology for dialysis 3. Acute encephalopathy grossly unchanged we will continue with supportive care 4. Healthcare associated pneumonia treated continue with follow-up. 5. Tracheostomy bleeding resolved we will continue present management   I have personally seen and evaluated the patient, evaluated laboratory and imaging results, formulated the assessment and plan and placed orders. The Patient requires high complexity decision making for assessment and support.  Case was discussed on Rounds with the Respiratory Therapy Staff  Allyne Gee, MD [facility] Pulmonary Critical Care Medicine Sleep Medicine

## 2018-05-13 NOTE — Progress Notes (Signed)
[facility]  ROUNDING NOTE   Subjective:  Patient seen and evaluated at bedside. He is seen during dialysis treatment. Appears to be tolerating well.     Objective:  Vital signs in last 24 hours:  Temperature 97.3 pulse 100 respirations 20 blood pressure 130/73  Physical Exam: General: Critically ill appearing  Head: Normocephalic, atraumatic. Moist oral mucosal membranes  Eyes: Anicteric  Neck: Tracheostomy in place  Lungs:  Scattered rhonchi, normal effort  Heart: S1S2 no rubs  Abdomen:  Soft, nontender, bowel sounds present  Extremities: trace peripheral edema.  Neurologic: Sedated  Skin: No lesions  Access: R IJ PC    Basic Metabolic Panel: Recent Labs  Lab 05/08/18 0602 05/10/18 0606 05/13/18 0455  NA 138 137 139  K 3.9 4.1 3.7  CL 100 99 101  CO2 24 24 22   GLUCOSE 86 92 107*  BUN 42* 37* 52*  CREATININE 7.54* 6.29* 7.65*  CALCIUM 8.2* 8.3* 8.7*  PHOS 5.2* 6.3* 5.1*    Liver Function Tests: Recent Labs  Lab 05/08/18 0602 05/10/18 0606 05/13/18 0455  ALBUMIN 2.1* 2.2* 2.4*   No results for input(s): LIPASE, AMYLASE in the last 168 hours. No results for input(s): AMMONIA in the last 168 hours.  CBC: Recent Labs  Lab 05/08/18 0602 05/10/18 0606 05/13/18 0455  WBC 6.8 11.1* 21.1*  HGB 7.9* 9.8* 10.1*  HCT 26.8* 31.6* 33.7*  MCV 96.1 94.9 95.7  PLT 373 384 430*    Cardiac Enzymes: No results for input(s): CKTOTAL, CKMB, CKMBINDEX, TROPONINI in the last 168 hours.  BNP: Invalid input(s): POCBNP  CBG: No results for input(s): GLUCAP in the last 168 hours.  Microbiology: Results for orders placed or performed during the hospital encounter of 04/26/18  Culture, respiratory     Status: None   Collection Time: 05/01/18  3:34 PM  Result Value Ref Range Status   Specimen Description TRACHEAL ASPIRATE  Final   Special Requests NONE  Final   Gram Stain   Final    MODERATE WBC PRESENT,BOTH PMN AND MONONUCLEAR MODERATE GRAM  POSITIVE COCCI IN PAIRS FEW GRAM NEGATIVE RODS MODERATE GRAM POSITIVE RODS RARE SQUAMOUS EPITHELIAL CELLS PRESENT Performed at [facility], [place] [address] [place], [place] [address]    Culture FEW METHICILLIN RESISTANT STAPHYLOCOCCUS AUREUS  Final   Report Status 05/04/2018 FINAL  Final   Organism ID, Bacteria METHICILLIN RESISTANT STAPHYLOCOCCUS AUREUS  Final      Susceptibility   Methicillin resistant staphylococcus aureus - MIC*    CIPROFLOXACIN >=8 RESISTANT Resistant     ERYTHROMYCIN <=0.25 SENSITIVE Sensitive     GENTAMICIN <=0.5 SENSITIVE Sensitive     OXACILLIN >=4 RESISTANT Resistant     TETRACYCLINE <=1 SENSITIVE Sensitive     VANCOMYCIN <=0.5 SENSITIVE Sensitive     TRIMETH/SULFA <=10 SENSITIVE Sensitive     CLINDAMYCIN <=0.25 SENSITIVE Sensitive     RIFAMPIN <=0.5 SENSITIVE Sensitive     Inducible Clindamycin NEGATIVE Sensitive     * FEW METHICILLIN RESISTANT STAPHYLOCOCCUS AUREUS  Culture, respiratory (non-expectorated)     Status: None   Collection Time: 05/04/18  2:27 PM  Result Value Ref Range Status   Specimen Description TRACHEAL ASPIRATE  Final   Special Requests NONE  Final   Gram Stain   Final    FEW WBC PRESENT, PREDOMINANTLY PMN FEW GRAM POSITIVE COCCI    Culture   Final    MODERATE METHICILLIN RESISTANT STAPHYLOCOCCUS AUREUS   Report Status 05/06/2018 FINAL  Final   Organism ID, Bacteria METHICILLIN RESISTANT STAPHYLOCOCCUS AUREUS  Final      Susceptibility   Methicillin resistant staphylococcus aureus - MIC*    CIPROFLOXACIN >=8 RESISTANT Resistant     ERYTHROMYCIN <=0.25 SENSITIVE Sensitive     GENTAMICIN <=0.5 SENSITIVE Sensitive     OXACILLIN >=4 RESISTANT Resistant     TETRACYCLINE <=1 SENSITIVE Sensitive     VANCOMYCIN <=0.5 SENSITIVE Sensitive     TRIMETH/SULFA <=10 SENSITIVE Sensitive     CLINDAMYCIN <=0.25 SENSITIVE Sensitive     RIFAMPIN <=0.5 SENSITIVE Sensitive     Inducible Clindamycin NEGATIVE Sensitive     * MODERATE  METHICILLIN RESISTANT STAPHYLOCOCCUS AUREUS  C difficile quick scan w PCR reflex     Status: None   Collection Time: 05/10/18 10:48 AM  Result Value Ref Range Status   C Diff antigen NEGATIVE NEGATIVE Final   C Diff toxin NEGATIVE NEGATIVE Final   C Diff interpretation No C. difficile detected.  Final    Comment: Performed at [facility], [place] [address], [place], [place] [address]    Coagulation Studies: No results for input(s): LABPROT, INR in the last 72 hours.  Urinalysis: No results for input(s): COLORURINE, LABSPEC, PHURINE, GLUCOSEU, HGBUR, BILIRUBINUR, KETONESUR, PROTEINUR, UROBILINOGEN, NITRITE, LEUKOCYTESUR in the last 72 hours.  Invalid input(s): APPERANCEUR    Imaging: No results found.   Medications:       Assessment/ Plan:  62 y.o. 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  1.  ESRD on HD.    Patient seen during dialysis treatment.  Tolerating well.  We plan to complete dialysis treatment today and next dialysis on Wednesday.  2.  Acute respiratory failure.   Currently off the ventilator, on trach collar.   3.  Secondary hyperparathyroidism.    Phosphorus down to 5.1 and acceptable.  4.  Anemia of chronic kidney disease.  Hemoglobin currently 10.1 and acceptable.   LOS: 0 Sagan Maselli 12/16/20198:15 AM

## 2018-05-14 NOTE — Progress Notes (Signed)
Pulmonary Critical Care Medicine [place]   PULMONARY CRITICAL CARE SERVICE  PROGRESS NOTE  Date of Service: 05/14/2018  Jimmy Martin  FFM:[id number]  DOB: Apr 10, 1956   DOA: 04/26/2018  Referring Physician: Merton Border, MD  HPI: Jimmy Martin is a 62 y.o. male seen for follow up of Acute on Chronic Respiratory Failure.  Patient is comfortable right now on T collar has had no issues with the wean.  His primary ENT apparently who placed tracheostomy sent a message regarding he needs follow-up for his tumor.  The patient has had this tumor referred all the way back about 2 years ago.  Unfortunately she does not have admission privileges to this facility so we will need to try to arrange for a visit to her once the patient is ready for discharge.  Medications: Reviewed on Rounds  Physical Exam:  Vitals: Temperature 97.9 pulse 86 respiratory rate 20 blood pressure 130/75 saturations are 99%  Ventilator Settings off the ventilator on T collar at this time on 28% FiO2  . General: Comfortable at this time . Eyes: Grossly normal lids, irises & conjunctiva . ENT: grossly tongue is normal . Neck: no obvious mass . Cardiovascular: S1 S2 normal no gallop . Respiratory: No rhonchi or rales are noted . Abdomen: soft . Skin: no rash seen on limited exam . Musculoskeletal: not rigid . Psychiatric:unable to assess . Neurologic: no seizure no involuntary movements         Lab Data:   Basic Metabolic Panel: Recent Labs  Lab 05/08/18 0602 05/10/18 0606 05/13/18 0455  NA 138 137 139  K 3.9 4.1 3.7  CL 100 99 101  CO2 24 24 22   GLUCOSE 86 92 107*  BUN 42* 37* 52*  CREATININE 7.54* 6.29* 7.65*  CALCIUM 8.2* 8.3* 8.7*  PHOS 5.2* 6.3* 5.1*    ABG: No results for input(s): PHART, PCO2ART, PO2ART, HCO3, O2SAT in the last 168 hours.  Liver Function Tests: Recent Labs  Lab 05/08/18 0602 05/10/18 0606 05/13/18 0455  ALBUMIN 2.1* 2.2* 2.4*   No results for  input(s): LIPASE, AMYLASE in the last 168 hours. No results for input(s): AMMONIA in the last 168 hours.  CBC: Recent Labs  Lab 05/08/18 0602 05/10/18 0606 05/13/18 0455  WBC 6.8 11.1* 21.1*  HGB 7.9* 9.8* 10.1*  HCT 26.8* 31.6* 33.7*  MCV 96.1 94.9 95.7  PLT 373 384 430*    Cardiac Enzymes: No results for input(s): CKTOTAL, CKMB, CKMBINDEX, TROPONINI in the last 168 hours.  BNP (last 3 results) No results for input(s): BNP in the last 8760 hours.  ProBNP (last 3 results) No results for input(s): PROBNP in the last 8760 hours.  Radiological Exams: No results found.  Assessment/Plan Active Problems:   Tracheostomy tube present (HCC)   Stage 5 chronic kidney disease on chronic dialysis (HCC)   Encephalopathy acute   Acute on chronic respiratory failure with hypoxia (HCC)   Healthcare-associated pneumonia   Bleeding at insertion site   1. Acute on chronic respiratory failure with hypoxia we will continue with T collar trials continue pulmonary toilet secretion management. 2. Stage V kidney disease end-stage on renal failure dialysis replacement therapy 3. Healthcare associated pneumonia treated resolved 4. Tracheostomy with bleeding resolved we will continue to monitor 5. Encephalopathy has improved   I have personally seen and evaluated the patient, evaluated laboratory and imaging results, formulated the assessment and plan and placed orders. The Patient requires high complexity decision making for assessment  and support.  Case was discussed on Rounds with the Respiratory Therapy Staff  Allyne Gee, MD [facility] Pulmonary Critical Care Medicine Sleep Medicine

## 2018-05-15 LAB — CBC
HCT: 30.9 % — ABNORMAL LOW (ref 39.0–52.0)
Hemoglobin: 9.5 g/dL — ABNORMAL LOW (ref 13.0–17.0)
MCH: 29 pg (ref 26.0–34.0)
MCHC: 30.7 g/dL (ref 30.0–36.0)
MCV: 94.2 fL (ref 80.0–100.0)
Platelets: 352 10*3/uL (ref 150–400)
RBC: 3.28 MIL/uL — ABNORMAL LOW (ref 4.22–5.81)
RDW: 14.1 % (ref 11.5–15.5)
WBC: 19.3 10*3/uL — ABNORMAL HIGH (ref 4.0–10.5)
nRBC: 0 % (ref 0.0–0.2)

## 2018-05-15 LAB — RENAL FUNCTION PANEL
Albumin: 2.4 g/dL — ABNORMAL LOW (ref 3.5–5.0)
Anion gap: 13 (ref 5–15)
BUN: 78 mg/dL — ABNORMAL HIGH (ref 6–20)
CALCIUM: 8.8 mg/dL — AB (ref 8.9–10.3)
CO2: 25 mmol/L (ref 22–32)
Chloride: 100 mmol/L (ref 98–111)
Creatinine, Ser: 9.59 mg/dL — ABNORMAL HIGH (ref 0.61–1.24)
GFR calc Af Amer: 6 mL/min — ABNORMAL LOW (ref >60)
GFR calc non Af Amer: 5 mL/min — ABNORMAL LOW (ref >60)
Glucose, Bld: 104 mg/dL — ABNORMAL HIGH (ref 70–99)
Phosphorus: 6.1 mg/dL — ABNORMAL HIGH (ref 2.5–4.6)
Potassium: 3.5 mmol/L (ref 3.5–5.1)
SODIUM: 138 mmol/L (ref 135–145)

## 2018-05-15 NOTE — Progress Notes (Signed)
[facility]  ROUNDING NOTE   Subjective:  Patient seen and evaluated during hemodialysis.  Ultrafiltration performed is 1.5 kg. A bit agitated despite sedation today.     Objective:  Vital signs in last 24 hours:  Temperature 97.4 pulse 82 respirations 20 blood pressure 118/72  Physical Exam: General: Critically ill appearing  Head: Normocephalic, atraumatic. Moist oral mucosal membranes  Eyes: Anicteric  Neck: Tracheostomy in place  Lungs:  Scattered rhonchi, normal effort  Heart: S1S2 no rubs  Abdomen:  Soft, nontender, bowel sounds present  Extremities: trace peripheral edema.  Neurologic: On sedation but awake and anxious  Skin: No lesions  Access: R IJ PC    Basic Metabolic Panel: Recent Labs  Lab 05/10/18 0606 05/13/18 0455 05/15/18 0556  NA 137 139 138  K 4.1 3.7 3.5  CL 99 101 100  CO2 24 22 25   GLUCOSE 92 107* 104*  BUN 37* 52* 78*  CREATININE 6.29* 7.65* 9.59*  CALCIUM 8.3* 8.7* 8.8*  PHOS 6.3* 5.1* 6.1*    Liver Function Tests: Recent Labs  Lab 05/10/18 0606 05/13/18 0455 05/15/18 0556  ALBUMIN 2.2* 2.4* 2.4*   No results for input(s): LIPASE, AMYLASE in the last 168 hours. No results for input(s): AMMONIA in the last 168 hours.  CBC: Recent Labs  Lab 05/10/18 0606 05/13/18 0455 05/15/18 0556  WBC 11.1* 21.1* 19.3*  HGB 9.8* 10.1* 9.5*  HCT 31.6* 33.7* 30.9*  MCV 94.9 95.7 94.2  PLT 384 430* 352    Cardiac Enzymes: No results for input(s): CKTOTAL, CKMB, CKMBINDEX, TROPONINI in the last 168 hours.  BNP: Invalid input(s): POCBNP  CBG: No results for input(s): GLUCAP in the last 168 hours.  Microbiology: Results for orders placed or performed during the hospital encounter of 04/26/18  Culture, respiratory     Status: None   Collection Time: 05/01/18  3:34 PM  Result Value Ref Range Status   Specimen Description TRACHEAL ASPIRATE  Final   Special Requests NONE  Final   Gram Stain   Final    MODERATE WBC  PRESENT,BOTH PMN AND MONONUCLEAR MODERATE GRAM POSITIVE COCCI IN PAIRS FEW GRAM NEGATIVE RODS MODERATE GRAM POSITIVE RODS RARE SQUAMOUS EPITHELIAL CELLS PRESENT Performed at [facility], [place] [address] [place], [place] [address]    Culture FEW METHICILLIN RESISTANT STAPHYLOCOCCUS AUREUS  Final   Report Status 05/04/2018 FINAL  Final   Organism ID, Bacteria METHICILLIN RESISTANT STAPHYLOCOCCUS AUREUS  Final      Susceptibility   Methicillin resistant staphylococcus aureus - MIC*    CIPROFLOXACIN >=8 RESISTANT Resistant     ERYTHROMYCIN <=0.25 SENSITIVE Sensitive     GENTAMICIN <=0.5 SENSITIVE Sensitive     OXACILLIN >=4 RESISTANT Resistant     TETRACYCLINE <=1 SENSITIVE Sensitive     VANCOMYCIN <=0.5 SENSITIVE Sensitive     TRIMETH/SULFA <=10 SENSITIVE Sensitive     CLINDAMYCIN <=0.25 SENSITIVE Sensitive     RIFAMPIN <=0.5 SENSITIVE Sensitive     Inducible Clindamycin NEGATIVE Sensitive     * FEW METHICILLIN RESISTANT STAPHYLOCOCCUS AUREUS  Culture, respiratory (non-expectorated)     Status: None   Collection Time: 05/04/18  2:27 PM  Result Value Ref Range Status   Specimen Description TRACHEAL ASPIRATE  Final   Special Requests NONE  Final   Gram Stain   Final    FEW WBC PRESENT, PREDOMINANTLY PMN FEW GRAM POSITIVE COCCI    Culture   Final    MODERATE METHICILLIN RESISTANT STAPHYLOCOCCUS AUREUS  Report Status 05/06/2018 FINAL  Final   Organism ID, Bacteria METHICILLIN RESISTANT STAPHYLOCOCCUS AUREUS  Final      Susceptibility   Methicillin resistant staphylococcus aureus - MIC*    CIPROFLOXACIN >=8 RESISTANT Resistant     ERYTHROMYCIN <=0.25 SENSITIVE Sensitive     GENTAMICIN <=0.5 SENSITIVE Sensitive     OXACILLIN >=4 RESISTANT Resistant     TETRACYCLINE <=1 SENSITIVE Sensitive     VANCOMYCIN <=0.5 SENSITIVE Sensitive     TRIMETH/SULFA <=10 SENSITIVE Sensitive     CLINDAMYCIN <=0.25 SENSITIVE Sensitive     RIFAMPIN <=0.5 SENSITIVE Sensitive     Inducible  Clindamycin NEGATIVE Sensitive     * MODERATE METHICILLIN RESISTANT STAPHYLOCOCCUS AUREUS  C difficile quick scan w PCR reflex     Status: None   Collection Time: 05/10/18 10:48 AM  Result Value Ref Range Status   C Diff antigen NEGATIVE NEGATIVE Final   C Diff toxin NEGATIVE NEGATIVE Final   C Diff interpretation No C. difficile detected.  Final    Comment: Performed at [facility], [place] [address] [place], [place] [address]    Coagulation Studies: No results for input(s): LABPROT, INR in the last 72 hours.  Urinalysis: No results for input(s): COLORURINE, LABSPEC, PHURINE, GLUCOSEU, HGBUR, BILIRUBINUR, KETONESUR, PROTEINUR, UROBILINOGEN, NITRITE, LEUKOCYTESUR in the last 72 hours.  Invalid input(s): APPERANCEUR    Imaging: No results found.   Medications:       Assessment/ Plan:  62 y.o. 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  1.  ESRD on HD.    Patient seen and evaluated during hemodialysis.  Tolerating well.  Ultrafiltration completed thus far is 1.5 kg.  2.  Acute respiratory failure.    Pulmonary/critical care following the patient's respiratory status.  3.  Secondary hyperparathyroidism.    Phosphorus a bit higher today at 6.1.  Repeat on Friday.  4.  Anemia of chronic kidney disease.  Hemoglobin currently 9.5.  Hemoglobin close to target.  Repeat on Friday.   LOS: 0 Kmari Halter 12/18/201911:29 AM

## 2018-05-15 NOTE — Progress Notes (Signed)
Pulmonary Critical Care Medicine [place]   PULMONARY CRITICAL CARE SERVICE  PROGRESS NOTE  Date of Service: 05/15/2018  Jimmy Martin  HGD:[id number]  DOB: September 03, 1955   DOA: 04/26/2018  Referring Physician: Merton Border, MD  HPI: Jimmy Martin is a 62 y.o. male seen for follow up of Acute on Chronic Respiratory Failure.  Patient is doing well he remains off the ventilator on T collar is on 28% FiO2.  Today he was on PMV tolerating it fairly well.  Medications: Reviewed on Rounds  Physical Exam:  Vitals: Temperature 97.8 pulse 82 respiratory rate 20 blood pressure 118/72 saturations 99%  Ventilator Settings patient is currently on T collar FiO2 28%  . General: Comfortable at this time . Eyes: Grossly normal lids, irises & conjunctiva . ENT: grossly tongue is normal . Neck: no obvious mass . Cardiovascular: S1 S2 normal no gallop . Respiratory: No rhonchi or rales are noted at this time . Abdomen: soft . Skin: no rash seen on limited exam . Musculoskeletal: not rigid . Psychiatric:unable to assess . Neurologic: no seizure no involuntary movements         Lab Data:   Basic Metabolic Panel: Recent Labs  Lab 05/10/18 0606 05/13/18 0455 05/15/18 0556  NA 137 139 138  K 4.1 3.7 3.5  CL 99 101 100  CO2 24 22 25   GLUCOSE 92 107* 104*  BUN 37* 52* 78*  CREATININE 6.29* 7.65* 9.59*  CALCIUM 8.3* 8.7* 8.8*  PHOS 6.3* 5.1* 6.1*    ABG: No results for input(s): PHART, PCO2ART, PO2ART, HCO3, O2SAT in the last 168 hours.  Liver Function Tests: Recent Labs  Lab 05/10/18 0606 05/13/18 0455 05/15/18 0556  ALBUMIN 2.2* 2.4* 2.4*   No results for input(s): LIPASE, AMYLASE in the last 168 hours. No results for input(s): AMMONIA in the last 168 hours.  CBC: Recent Labs  Lab 05/10/18 0606 05/13/18 0455 05/15/18 0556  WBC 11.1* 21.1* 19.3*  HGB 9.8* 10.1* 9.5*  HCT 31.6* 33.7* 30.9*  MCV 94.9 95.7 94.2  PLT 384 430* 352    Cardiac  Enzymes: No results for input(s): CKTOTAL, CKMB, CKMBINDEX, TROPONINI in the last 168 hours.  BNP (last 3 results) No results for input(s): BNP in the last 8760 hours.  ProBNP (last 3 results) No results for input(s): PROBNP in the last 8760 hours.  Radiological Exams: No results found.  Assessment/Plan Active Problems:   Tracheostomy tube present (HCC)   Stage 5 chronic kidney disease on chronic dialysis (HCC)   Encephalopathy acute   Acute on chronic respiratory failure with hypoxia (HCC)   Healthcare-associated pneumonia   Bleeding at insertion site   1. Acute on chronic respiratory failure with hypoxia patient is actually doing well he is tolerating PMV well he should be able to advance to capping.  As far as his other issues or concern he is not showing ongoing clinical improvement.  The patient and family still want him transferred to [facility] 2. Stage V chronic kidney disease on dialysis patient being followed by nephrology. 3. Acute encephalopathy has cleared up 4. Healthcare associated pneumonia treated empirically. 5. Tracheostomy with bleeding at insertion site.  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  This the family would like for it to occur back up in the [facility] and therefore will need transfer back to the facility for further evaluation.  I will also try to see  if it is possible that while he is here if ENT can take a look at him   I have personally seen and evaluated the patient, evaluated laboratory and imaging results, formulated the assessment and plan and placed orders.  Time spent 35 minutes review of the chart discussion with the family and patient and treatment team The Patient requires high complexity decision making for assessment and support.  Case was discussed on Rounds with the Respiratory Therapy Staff  Allyne Gee, MD [facility] Pulmonary Critical Care Medicine Sleep Medicine

## 2018-05-16 ENCOUNTER — Ambulatory Visit (HOSPITAL_COMMUNITY): Payer: No Typology Code available for payment source | Attending: Internal Medicine

## 2018-05-16 DIAGNOSIS — J9621 Acute and chronic respiratory failure with hypoxia: Secondary | ICD-10-CM

## 2018-05-16 DIAGNOSIS — G9340 Encephalopathy, unspecified: Secondary | ICD-10-CM

## 2018-05-16 DIAGNOSIS — Z992 Dependence on renal dialysis: Secondary | ICD-10-CM

## 2018-05-16 DIAGNOSIS — N186 End stage renal disease: Secondary | ICD-10-CM

## 2018-05-16 DIAGNOSIS — N179 Acute kidney failure, unspecified: Secondary | ICD-10-CM | POA: Diagnosis present

## 2018-05-16 DIAGNOSIS — J189 Pneumonia, unspecified organism: Secondary | ICD-10-CM

## 2018-05-16 DIAGNOSIS — Z930 Tracheostomy status: Secondary | ICD-10-CM

## 2018-05-16 NOTE — Progress Notes (Addendum)
Pulmonary Critical Care Medicine [place]   PULMONARY CRITICAL CARE SERVICE  PROGRESS NOTE  Date of Service: 05/16/2018  KAREEN HITSMAN  HYI:[id number]  DOB: 07-28-1955   DOA: 04/26/2018  Referring Physician: Merton Border, MD  HPI: Jimmy Martin is a 62 y.o. male seen for follow up of Acute on Chronic Respiratory Failure.  Patient is doing well he remains off the ventilator has been on trach collar at 28% FiO2 today.  He is also been tolerating PMV on and off throughout the day.  RT has reported thick secretions intermittently.  Medications: Reviewed on Rounds  Physical Exam:  Vitals: Pulse 85 respirations 22 blood pressure 102/61 O2 sat 99% temperature 97.7  Ventilator Settings patient is not currently on ventilator  . General: Comfortable at this time . Eyes: Grossly normal lids, irises & conjunctiva . ENT: grossly tongue is normal . Neck: no obvious mass . Cardiovascular: S1 S2 normal no gallop . Respiratory: Coarse breath sounds . Abdomen: soft . Skin: no rash seen on limited exam . Musculoskeletal: not rigid . Psychiatric:unable to assess . Neurologic: no seizure no involuntary movements         Lab Data:   Basic Metabolic Panel: Recent Labs  Lab 05/10/18 0606 05/13/18 0455 05/15/18 0556  NA 137 139 138  K 4.1 3.7 3.5  CL 99 101 100  CO2 24 22 25   GLUCOSE 92 107* 104*  BUN 37* 52* 78*  CREATININE 6.29* 7.65* 9.59*  CALCIUM 8.3* 8.7* 8.8*  PHOS 6.3* 5.1* 6.1*    ABG: No results for input(s): PHART, PCO2ART, PO2ART, HCO3, O2SAT in the last 168 hours.  Liver Function Tests: Recent Labs  Lab 05/10/18 0606 05/13/18 0455 05/15/18 0556  ALBUMIN 2.2* 2.4* 2.4*   No results for input(s): LIPASE, AMYLASE in the last 168 hours. No results for input(s): AMMONIA in the last 168 hours.  CBC: Recent Labs  Lab 05/10/18 0606 05/13/18 0455 05/15/18 0556  WBC 11.1* 21.1* 19.3*  HGB 9.8* 10.1* 9.5*  HCT 31.6* 33.7* 30.9*  MCV 94.9  95.7 94.2  PLT 384 430* 352    Cardiac Enzymes: No results for input(s): CKTOTAL, CKMB, CKMBINDEX, TROPONINI in the last 168 hours.  BNP (last 3 results) No results for input(s): BNP in the last 8760 hours.  ProBNP (last 3 results) No results for input(s): PROBNP in the last 8760 hours.  Radiological Exams: Vas [place] Upper Ext Vein Mapping (pre-op Avf)  Result Date: 05/16/2018 UPPER EXTREMITY VEIN MAPPING  Indications: Pre-access. Performing Technologist: Oda Cogan RDMS, RVT  Examination Guidelines: A complete evaluation includes B-mode imaging, spectral Doppler, color Doppler, and power Doppler as needed of all accessible portions of each vessel. Bilateral testing is considered an integral part of a complete examination. Limited examinations for reoccurring indications may be performed as noted. +-----------------+-------------+----------+--------+ Right Cephalic   Diameter (cm)Depth (cm)Findings +-----------------+-------------+----------+--------+ Shoulder             0.27                        +-----------------+-------------+----------+--------+ Prox upper arm       0.25        0.33            +-----------------+-------------+----------+--------+ Mid upper arm        0.22        0.39            +-----------------+-------------+----------+--------+ Dist upper arm  0.21        0.44            +-----------------+-------------+----------+--------+ Antecubital fossa    0.19        0.31            +-----------------+-------------+----------+--------+ Prox forearm         0.23        0.25            +-----------------+-------------+----------+--------+ Mid forearm          0.21        0.24            +-----------------+-------------+----------+--------+ Dist forearm         0.19        0.22            +-----------------+-------------+----------+--------+ +-----------------+-------------+----------+---------+ Right Basilic    Diameter (cm)Depth  (cm)Findings  +-----------------+-------------+----------+---------+ Prox upper arm       0.59                         +-----------------+-------------+----------+---------+ Mid upper arm        0.43                         +-----------------+-------------+----------+---------+ Dist upper arm       0.43                         +-----------------+-------------+----------+---------+ Antecubital fossa    0.22               branching +-----------------+-------------+----------+---------+ Prox forearm         0.20                         +-----------------+-------------+----------+---------+ Mid forearm          0.24                         +-----------------+-------------+----------+---------+ Distal forearm       0.22                         +-----------------+-------------+----------+---------+ Elbow                0.22                         +-----------------+-------------+----------+---------+ +-----------------+-------------+----------+------------------------+ Left Cephalic    Diameter (cm)Depth (cm)        Findings         +-----------------+-------------+----------+------------------------+ Shoulder             0.16                                        +-----------------+-------------+----------+------------------------+ Prox upper arm       0.17                                        +-----------------+-------------+----------+------------------------+ Mid upper arm        0.21        0.31                            +-----------------+-------------+----------+------------------------+  Dist upper arm       0.27        0.34                            +-----------------+-------------+----------+------------------------+ Antecubital fossa    0.33        0.25   branching and thrombosed +-----------------+-------------+----------+------------------------+ Prox forearm         0.26                      branching          +-----------------+-------------+----------+------------------------+ Mid forearm          0.26                                        +-----------------+-------------+----------+------------------------+ Dist forearm         0.25                                        +-----------------+-------------+----------+------------------------+ Wrist                0.17                                        +-----------------+-------------+----------+------------------------+ +-----------------+-------------+----------+--------------+ Left Basilic     Diameter (cm)Depth (cm)   Findings    +-----------------+-------------+----------+--------------+ Prox upper arm       0.64                              +-----------------+-------------+----------+--------------+ Mid upper arm        0.50                 branching    +-----------------+-------------+----------+--------------+ Dist upper arm       0.41                              +-----------------+-------------+----------+--------------+ Antecubital fossa    0.32                              +-----------------+-------------+----------+--------------+ Prox forearm         0.18                              +-----------------+-------------+----------+--------------+ Mid forearm          0.18                              +-----------------+-------------+----------+--------------+ Distal forearm                          not visualized +-----------------+-------------+----------+--------------+ Elbow                                   not visualized +-----------------+-------------+----------+--------------+ Wrist  not visualized +-----------------+-------------+----------+--------------+ *See table(s) above for measurements and observations.  Diagnosing physician: Ruta Hinds MD Electronically signed by Ruta Hinds MD on 05/16/2018 at 1:49:48 PM.    Final      Assessment/Plan Active Problems:   Tracheostomy tube present [facility])   Stage 5 chronic kidney disease on chronic dialysis ([place])   Encephalopathy acute   Acute on chronic respiratory failure with hypoxia ([place])   Healthcare-associated pneumonia   Bleeding at insertion site   1. Acute on chronic respiratory failure with hypoxia patient doing well tolerating PMV.  He is protecting his airway and spitting secretions as needed.  He would likely advance to capping tomorrow. 2. Stage V chronic kidney disease on dialysis patient being followed by nephrology 3. Acute encephalopathy clearing 4. Healthcare associated pneumonia treated empirically 5. 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  The family would like the patient to be seen back at [facility] and transferred to their facility for further evaluation   I have personally seen and evaluated the patient, evaluated laboratory and imaging results, formulated the assessment and plan and placed orders. The Patient requires high complexity decision making for assessment and support.  Case was discussed on Rounds with the Respiratory Therapy Staff  Allyne Gee, MD [facility] Pulmonary Critical Care Medicine Sleep Medicine

## 2018-05-17 LAB — CBC
HCT: 33.9 % — ABNORMAL LOW (ref 39.0–52.0)
Hemoglobin: 10.8 g/dL — ABNORMAL LOW (ref 13.0–17.0)
MCH: 30 pg (ref 26.0–34.0)
MCHC: 31.9 g/dL (ref 30.0–36.0)
MCV: 94.2 fL (ref 80.0–100.0)
PLATELETS: 392 10*3/uL (ref 150–400)
RBC: 3.6 MIL/uL — ABNORMAL LOW (ref 4.22–5.81)
RDW: 14.4 % (ref 11.5–15.5)
WBC: 25.7 10*3/uL — ABNORMAL HIGH (ref 4.0–10.5)
nRBC: 0.2 % (ref 0.0–0.2)

## 2018-05-17 LAB — RENAL FUNCTION PANEL
Albumin: 2.6 g/dL — ABNORMAL LOW (ref 3.5–5.0)
Anion gap: 14 (ref 5–15)
BUN: 63 mg/dL — ABNORMAL HIGH (ref 6–20)
CO2: 25 mmol/L (ref 22–32)
CREATININE: 8.24 mg/dL — AB (ref 0.61–1.24)
Calcium: 9.1 mg/dL (ref 8.9–10.3)
Chloride: 97 mmol/L — ABNORMAL LOW (ref 98–111)
GFR calc Af Amer: 7 mL/min — ABNORMAL LOW (ref >60)
GFR calc non Af Amer: 6 mL/min — ABNORMAL LOW (ref >60)
Glucose, Bld: 106 mg/dL — ABNORMAL HIGH (ref 70–99)
Phosphorus: 6.3 mg/dL — ABNORMAL HIGH (ref 2.5–4.6)
Potassium: 3.6 mmol/L (ref 3.5–5.1)
Sodium: 136 mmol/L (ref 135–145)

## 2018-05-17 NOTE — Progress Notes (Signed)
Pulmonary Critical Care Medicine [place]   PULMONARY CRITICAL CARE SERVICE  PROGRESS NOTE  Date of Service: 05/17/2018  Jimmy Martin  OVF:[id number]  DOB: 1955/07/31   DOA: 04/26/2018  Referring Physician: Merton Border, MD  HPI: Jimmy Martin is a 62 y.o. male seen for follow up of Acute on Chronic Respiratory Failure.  Patient is on T collar doing fairly well.  He has been increased transiently to 40% FiO2 should be able to wean back down to 28%  Medications: Reviewed on Rounds  Physical Exam:  Vitals: Temperature 97.2 pulse 112 respiratory 20 blood pressure 136/58 saturations 100%  Ventilator Settings off the ventilator on T collar right now  . General: Comfortable at this time . Eyes: Grossly normal lids, irises & conjunctiva . ENT: grossly tongue is normal . Neck: no obvious mass . Cardiovascular: S1 S2 normal no gallop . Respiratory: No rhonchi no rales are noted . Abdomen: soft . Skin: no rash seen on limited exam . Musculoskeletal: not rigid . Psychiatric:unable to assess . Neurologic: no seizure no involuntary movements         Lab Data:   Basic Metabolic Panel: Recent Labs  Lab 05/13/18 0455 05/15/18 0556 05/17/18 0609  NA 139 138 136  K 3.7 3.5 3.6  CL 101 100 97*  CO2 22 25 25   GLUCOSE 107* 104* 106*  BUN 52* 78* 63*  CREATININE 7.65* 9.59* 8.24*  CALCIUM 8.7* 8.8* 9.1  PHOS 5.1* 6.1* 6.3*    ABG: No results for input(s): PHART, PCO2ART, PO2ART, HCO3, O2SAT in the last 168 hours.  Liver Function Tests: Recent Labs  Lab 05/13/18 0455 05/15/18 0556 05/17/18 0609  ALBUMIN 2.4* 2.4* 2.6*   No results for input(s): LIPASE, AMYLASE in the last 168 hours. No results for input(s): AMMONIA in the last 168 hours.  CBC: Recent Labs  Lab 05/13/18 0455 05/15/18 0556 05/17/18 0609  WBC 21.1* 19.3* 25.7*  HGB 10.1* 9.5* 10.8*  HCT 33.7* 30.9* 33.9*  MCV 95.7 94.2 94.2  PLT 430* 352 392    Cardiac Enzymes: No  results for input(s): CKTOTAL, CKMB, CKMBINDEX, TROPONINI in the last 168 hours.  BNP (last 3 results) No results for input(s): BNP in the last 8760 hours.  ProBNP (last 3 results) No results for input(s): PROBNP in the last 8760 hours.  Radiological Exams: Vas [place] Upper Ext Vein Mapping (pre-op Avf)  Result Date: 05/16/2018 UPPER EXTREMITY VEIN MAPPING  Indications: Pre-access. Performing Technologist: Oda Cogan RDMS, RVT  Examination Guidelines: A complete evaluation includes B-mode imaging, spectral Doppler, color Doppler, and power Doppler as needed of all accessible portions of each vessel. Bilateral testing is considered an integral part of a complete examination. Limited examinations for reoccurring indications may be performed as noted. +-----------------+-------------+----------+--------+ Right Cephalic   Diameter (cm)Depth (cm)Findings +-----------------+-------------+----------+--------+ Shoulder             0.27                        +-----------------+-------------+----------+--------+ Prox upper arm       0.25        0.33            +-----------------+-------------+----------+--------+ Mid upper arm        0.22        0.39            +-----------------+-------------+----------+--------+ Dist upper arm       0.21  0.44            +-----------------+-------------+----------+--------+ Antecubital fossa    0.19        0.31            +-----------------+-------------+----------+--------+ Prox forearm         0.23        0.25            +-----------------+-------------+----------+--------+ Mid forearm          0.21        0.24            +-----------------+-------------+----------+--------+ Dist forearm         0.19        0.22            +-----------------+-------------+----------+--------+ +-----------------+-------------+----------+---------+ Right Basilic    Diameter (cm)Depth (cm)Findings   +-----------------+-------------+----------+---------+ Prox upper arm       0.59                         +-----------------+-------------+----------+---------+ Mid upper arm        0.43                         +-----------------+-------------+----------+---------+ Dist upper arm       0.43                         +-----------------+-------------+----------+---------+ Antecubital fossa    0.22               branching +-----------------+-------------+----------+---------+ Prox forearm         0.20                         +-----------------+-------------+----------+---------+ Mid forearm          0.24                         +-----------------+-------------+----------+---------+ Distal forearm       0.22                         +-----------------+-------------+----------+---------+ Elbow                0.22                         +-----------------+-------------+----------+---------+ +-----------------+-------------+----------+------------------------+ Left Cephalic    Diameter (cm)Depth (cm)        Findings         +-----------------+-------------+----------+------------------------+ Shoulder             0.16                                        +-----------------+-------------+----------+------------------------+ Prox upper arm       0.17                                        +-----------------+-------------+----------+------------------------+ Mid upper arm        0.21        0.31                            +-----------------+-------------+----------+------------------------+ Dist  upper arm       0.27        0.34                            +-----------------+-------------+----------+------------------------+ Antecubital fossa    0.33        0.25   branching and thrombosed +-----------------+-------------+----------+------------------------+ Prox forearm         0.26                      branching          +-----------------+-------------+----------+------------------------+ Mid forearm          0.26                                        +-----------------+-------------+----------+------------------------+ Dist forearm         0.25                                        +-----------------+-------------+----------+------------------------+ Wrist                0.17                                        +-----------------+-------------+----------+------------------------+ +-----------------+-------------+----------+--------------+ Left Basilic     Diameter (cm)Depth (cm)   Findings    +-----------------+-------------+----------+--------------+ Prox upper arm       0.64                              +-----------------+-------------+----------+--------------+ Mid upper arm        0.50                 branching    +-----------------+-------------+----------+--------------+ Dist upper arm       0.41                              +-----------------+-------------+----------+--------------+ Antecubital fossa    0.32                              +-----------------+-------------+----------+--------------+ Prox forearm         0.18                              +-----------------+-------------+----------+--------------+ Mid forearm          0.18                              +-----------------+-------------+----------+--------------+ Distal forearm                          not visualized +-----------------+-------------+----------+--------------+ Elbow                                   not visualized +-----------------+-------------+----------+--------------+ Wrist  not visualized +-----------------+-------------+----------+--------------+ *See table(s) above for measurements and observations.  Diagnosing physician: Ruta Hinds MD Electronically signed by Ruta Hinds MD on 05/16/2018 at 1:49:48 PM.    Final      Assessment/Plan Active Problems:   Tracheostomy tube present [facility])   Stage 5 chronic kidney disease on chronic dialysis ([place])   Encephalopathy acute   Acute on chronic respiratory failure with hypoxia ([place])   Healthcare-associated pneumonia   Bleeding at insertion site   1. Acute on chronic respiratory failure with hypoxia we will continue with the T collar trials wean FiO2.  Continue pulmonary toilet supportive care. 2. 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 Encephalopathy resolved 4. Healthcare associated pneumonia treated improved 5. Tracheostomy site with bleeding improved we will continue to monitor he needs follow-up with ENT   I have personally seen and evaluated the patient, evaluated laboratory and imaging results, formulated the assessment and plan and placed orders. The Patient requires high complexity decision making for assessment and support.  Case was discussed on Rounds with the Respiratory Therapy Staff  Allyne Gee, MD [facility] Pulmonary Critical Care Medicine Sleep Medicine

## 2018-05-17 NOTE — Progress Notes (Signed)
[facility]  ROUNDING NOTE   Subjective:  Patient seen at bedside. Seen and evaluated during hemodialysis. Still quite anxious.  Objective:  Vital signs in last 24 hours:  97.2 pulse 136 respirations 35 blood pressure 136/58  Physical Exam: General: Critically ill appearing  Head: Normocephalic, atraumatic. Moist oral mucosal membranes  Eyes: Anicteric  Neck: Tracheostomy in place  Lungs:  Scattered rhonchi, normal effort  Heart: S1S2 no rubs  Abdomen:  Soft, nontender, bowel sounds present  Extremities: trace peripheral edema.  Neurologic: Anxious  Skin: No lesions  Access: R IJ PC    Basic Metabolic Panel: Recent Labs  Lab 05/13/18 0455 05/15/18 0556 05/17/18 0609  NA 139 138 136  K 3.7 3.5 3.6  CL 101 100 97*  CO2 22 25 25   GLUCOSE 107* 104* 106*  BUN 52* 78* 63*  CREATININE 7.65* 9.59* 8.24*  CALCIUM 8.7* 8.8* 9.1  PHOS 5.1* 6.1* 6.3*    Liver Function Tests: Recent Labs  Lab 05/13/18 0455 05/15/18 0556 05/17/18 0609  ALBUMIN 2.4* 2.4* 2.6*   No results for input(s): LIPASE, AMYLASE in the last 168 hours. No results for input(s): AMMONIA in the last 168 hours.  CBC: Recent Labs  Lab 05/13/18 0455 05/15/18 0556 05/17/18 0609  WBC 21.1* 19.3* 25.7*  HGB 10.1* 9.5* 10.8*  HCT 33.7* 30.9* 33.9*  MCV 95.7 94.2 94.2  PLT 430* 352 392    Cardiac Enzymes: No results for input(s): CKTOTAL, CKMB, CKMBINDEX, TROPONINI in the last 168 hours.  BNP: Invalid input(s): POCBNP  CBG: No results for input(s): GLUCAP in the last 168 hours.  Microbiology: Results for orders placed or performed during the hospital encounter of 04/26/18  Culture, respiratory     Status: None   Collection Time: 05/01/18  3:34 PM  Result Value Ref Range Status   Specimen Description TRACHEAL ASPIRATE  Final   Special Requests NONE  Final   Gram Stain   Final    MODERATE WBC PRESENT,BOTH PMN AND MONONUCLEAR MODERATE GRAM POSITIVE COCCI IN PAIRS FEW GRAM  NEGATIVE RODS MODERATE GRAM POSITIVE RODS RARE SQUAMOUS EPITHELIAL CELLS PRESENT Performed at [facility], [place] [address] [place], [place] [address]    Culture FEW METHICILLIN RESISTANT STAPHYLOCOCCUS AUREUS  Final   Report Status 05/04/2018 FINAL  Final   Organism ID, Bacteria METHICILLIN RESISTANT STAPHYLOCOCCUS AUREUS  Final      Susceptibility   Methicillin resistant staphylococcus aureus - MIC*    CIPROFLOXACIN >=8 RESISTANT Resistant     ERYTHROMYCIN <=0.25 SENSITIVE Sensitive     GENTAMICIN <=0.5 SENSITIVE Sensitive     OXACILLIN >=4 RESISTANT Resistant     TETRACYCLINE <=1 SENSITIVE Sensitive     VANCOMYCIN <=0.5 SENSITIVE Sensitive     TRIMETH/SULFA <=10 SENSITIVE Sensitive     CLINDAMYCIN <=0.25 SENSITIVE Sensitive     RIFAMPIN <=0.5 SENSITIVE Sensitive     Inducible Clindamycin NEGATIVE Sensitive     * FEW METHICILLIN RESISTANT STAPHYLOCOCCUS AUREUS  Culture, respiratory (non-expectorated)     Status: None   Collection Time: 05/04/18  2:27 PM  Result Value Ref Range Status   Specimen Description TRACHEAL ASPIRATE  Final   Special Requests NONE  Final   Gram Stain   Final    FEW WBC PRESENT, PREDOMINANTLY PMN FEW GRAM POSITIVE COCCI    Culture   Final    MODERATE METHICILLIN RESISTANT STAPHYLOCOCCUS AUREUS   Report Status 05/06/2018 FINAL  Final   Organism ID, Bacteria METHICILLIN RESISTANT STAPHYLOCOCCUS  AUREUS  Final      Susceptibility   Methicillin resistant staphylococcus aureus - MIC*    CIPROFLOXACIN >=8 RESISTANT Resistant     ERYTHROMYCIN <=0.25 SENSITIVE Sensitive     GENTAMICIN <=0.5 SENSITIVE Sensitive     OXACILLIN >=4 RESISTANT Resistant     TETRACYCLINE <=1 SENSITIVE Sensitive     VANCOMYCIN <=0.5 SENSITIVE Sensitive     TRIMETH/SULFA <=10 SENSITIVE Sensitive     CLINDAMYCIN <=0.25 SENSITIVE Sensitive     RIFAMPIN <=0.5 SENSITIVE Sensitive     Inducible Clindamycin NEGATIVE Sensitive     * MODERATE METHICILLIN RESISTANT STAPHYLOCOCCUS  AUREUS  C difficile quick scan w PCR reflex     Status: None   Collection Time: 05/10/18 10:48 AM  Result Value Ref Range Status   C Diff antigen NEGATIVE NEGATIVE Final   C Diff toxin NEGATIVE NEGATIVE Final   C Diff interpretation No C. difficile detected.  Final    Comment: Performed at [facility], [place] [address], [place], [place] [address]    Coagulation Studies: No results for input(s): LABPROT, INR in the last 72 hours.  Urinalysis: No results for input(s): COLORURINE, LABSPEC, PHURINE, GLUCOSEU, HGBUR, BILIRUBINUR, KETONESUR, PROTEINUR, UROBILINOGEN, NITRITE, LEUKOCYTESUR in the last 72 hours.  Invalid input(s): APPERANCEUR    Imaging: Vas [place] Upper Ext Vein Mapping (pre-op Avf)  Result Date: 05/16/2018 UPPER EXTREMITY VEIN MAPPING  Indications: Pre-access. Performing Technologist: Oda Cogan RDMS, RVT  Examination Guidelines: A complete evaluation includes B-mode imaging, spectral Doppler, color Doppler, and power Doppler as needed of all accessible portions of each vessel. Bilateral testing is considered an integral part of a complete examination. Limited examinations for reoccurring indications may be performed as noted. +-----------------+-------------+----------+--------+ Right Cephalic   Diameter (cm)Depth (cm)Findings +-----------------+-------------+----------+--------+ Shoulder             0.27                        +-----------------+-------------+----------+--------+ Prox upper arm       0.25        0.33            +-----------------+-------------+----------+--------+ Mid upper arm        0.22        0.39            +-----------------+-------------+----------+--------+ Dist upper arm       0.21        0.44            +-----------------+-------------+----------+--------+ Antecubital fossa    0.19        0.31            +-----------------+-------------+----------+--------+ Prox forearm         0.23        0.25             +-----------------+-------------+----------+--------+ Mid forearm          0.21        0.24            +-----------------+-------------+----------+--------+ Dist forearm         0.19        0.22            +-----------------+-------------+----------+--------+ +-----------------+-------------+----------+---------+ Right Basilic    Diameter (cm)Depth (cm)Findings  +-----------------+-------------+----------+---------+ Prox upper arm       0.59                         +-----------------+-------------+----------+---------+  Mid upper arm        0.43                         +-----------------+-------------+----------+---------+ Dist upper arm       0.43                         +-----------------+-------------+----------+---------+ Antecubital fossa    0.22               branching +-----------------+-------------+----------+---------+ Prox forearm         0.20                         +-----------------+-------------+----------+---------+ Mid forearm          0.24                         +-----------------+-------------+----------+---------+ Distal forearm       0.22                         +-----------------+-------------+----------+---------+ Elbow                0.22                         +-----------------+-------------+----------+---------+ +-----------------+-------------+----------+------------------------+ Left Cephalic    Diameter (cm)Depth (cm)        Findings         +-----------------+-------------+----------+------------------------+ Shoulder             0.16                                        +-----------------+-------------+----------+------------------------+ Prox upper arm       0.17                                        +-----------------+-------------+----------+------------------------+ Mid upper arm        0.21        0.31                            +-----------------+-------------+----------+------------------------+  Dist upper arm       0.27        0.34                            +-----------------+-------------+----------+------------------------+ Antecubital fossa    0.33        0.25   branching and thrombosed +-----------------+-------------+----------+------------------------+ Prox forearm         0.26                      branching         +-----------------+-------------+----------+------------------------+ Mid forearm          0.26                                        +-----------------+-------------+----------+------------------------+ Dist forearm         0.25                                        +-----------------+-------------+----------+------------------------+  Wrist                0.17                                        +-----------------+-------------+----------+------------------------+ +-----------------+-------------+----------+--------------+ Left Basilic     Diameter (cm)Depth (cm)   Findings    +-----------------+-------------+----------+--------------+ Prox upper arm       0.64                              +-----------------+-------------+----------+--------------+ Mid upper arm        0.50                 branching    +-----------------+-------------+----------+--------------+ Dist upper arm       0.41                              +-----------------+-------------+----------+--------------+ Antecubital fossa    0.32                              +-----------------+-------------+----------+--------------+ Prox forearm         0.18                              +-----------------+-------------+----------+--------------+ Mid forearm          0.18                              +-----------------+-------------+----------+--------------+ Distal forearm                          not visualized +-----------------+-------------+----------+--------------+ Elbow                                   not visualized  +-----------------+-------------+----------+--------------+ Wrist                                   not visualized +-----------------+-------------+----------+--------------+ *See table(s) above for measurements and observations.  Diagnosing physician: Ruta Hinds MD Electronically signed by Ruta Hinds MD on 05/16/2018 at 1:49:48 PM.    Final      Medications:       Assessment/ Plan:  62 y.o. 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  1.  ESRD on HD.    Patient seen during dialysis treatment.  He appears again quite anxious.  He is currently off of Precedex.  We plan to complete the dialysis treatment today.  2.  Acute respiratory failure.    And off of the ventilator at the moment.  Continue to monitor his respiratory status closely.  3.  Secondary hyperparathyroidism.    Phosphorus remains a bit high at 6.3.  Consider adding binders next week.  4.  Anemia of chronic kidney disease.  Hemoglobin up to 10.8 and at target.   LOS: 0 Shantese Raven 12/20/20198:31 AM

## 2018-05-18 NOTE — Progress Notes (Addendum)
Pulmonary Critical Care Medicine [place]   PULMONARY CRITICAL CARE SERVICE  PROGRESS NOTE  Date of Service: 05/18/2018  Jimmy Martin  ZTI:[id number]  DOB: [birth year]   DOA: 04/26/2018  Referring Physician: Merton Border, MD  HPI: Jimmy Martin is a 62 y.o. male seen for follow up of Acute on Chronic Respiratory Failure.  Patient is doing well he continues to be on 28% trach collar.  He apparently decannulated himself overnight due to the T-bar.  He will switch to trach collar and is doing well with trach back in place.  Medications: Reviewed on Rounds  Physical Exam:  Vitals: Pulse 105 respirations 20 blood pressure 105/67 O2 sat 100% temp 98.4  Ventilator Settings patient is not currently on ventilator  . General: Comfortable at this time . Eyes: Grossly normal lids, irises & conjunctiva . ENT: grossly tongue is normal . Neck: no obvious mass . Cardiovascular: S1 S2 normal no gallop . Respiratory: Coarse breath sounds . Abdomen: soft . Skin: no rash seen on limited exam . Musculoskeletal: not rigid . Psychiatric:unable to assess . Neurologic: no seizure no involuntary movements         Lab Data:   Basic Metabolic Panel: Recent Labs  Lab 05/13/18 0455 05/15/18 0556 05/17/18 0609  NA 139 138 136  K 3.7 3.5 3.6  CL 101 100 97*  CO2 22 25 25   GLUCOSE 107* 104* 106*  BUN 52* 78* 63*  CREATININE 7.65* 9.59* 8.24*  CALCIUM 8.7* 8.8* 9.1  PHOS 5.1* 6.1* 6.3*    ABG: No results for input(s): PHART, PCO2ART, PO2ART, HCO3, O2SAT in the last 168 hours.  Liver Function Tests: Recent Labs  Lab 05/13/18 0455 05/15/18 0556 05/17/18 0609  ALBUMIN 2.4* 2.4* 2.6*   No results for input(s): LIPASE, AMYLASE in the last 168 hours. No results for input(s): AMMONIA in the last 168 hours.  CBC: Recent Labs  Lab 05/13/18 0455 05/15/18 0556 05/17/18 0609  WBC 21.1* 19.3* 25.7*  HGB 10.1* 9.5* 10.8*  HCT 33.7* 30.9* 33.9*  MCV 95.7 94.2 94.2   PLT 430* 352 392    Cardiac Enzymes: No results for input(s): CKTOTAL, CKMB, CKMBINDEX, TROPONINI in the last 168 hours.  BNP (last 3 results) No results for input(s): BNP in the last 8760 hours.  ProBNP (last 3 results) No results for input(s): PROBNP in the last 8760 hours.  Radiological Exams: No results found.  Assessment/Plan Active Problems:   Tracheostomy tube present ([facility])   Stage 5 chronic kidney disease on chronic dialysis ([facility])   Encephalopathy acute   Acute on chronic respiratory failure with hypoxia ([facility])   Healthcare-associated pneumonia   Bleeding at insertion site   1. Acute on chronic respiratory failure with hypoxia continue trach collar trials at this time.  Continue supportive care and pulmonary toilet. 2. 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 Encephalopathy resolved 4. Healthcare associated pneumonia treated improved 5. Tracheostomy site with bleeding improved continue to monitor.  Patient will need follow-up with ENT   I have personally seen and evaluated the patient, evaluated laboratory and imaging results, formulated the assessment and plan and placed orders. The Patient requires high complexity decision making for assessment and support.  Case was discussed on Rounds with the Respiratory Therapy Staff  Allyne Gee, MD [facility] Pulmonary Critical Care Medicine Sleep Medicine

## 2018-05-19 LAB — RENAL FUNCTION PANEL
ALBUMIN: 2.4 g/dL — AB (ref 3.5–5.0)
Anion gap: 20 — ABNORMAL HIGH (ref 5–15)
BUN: 74 mg/dL — AB (ref 6–20)
CO2: 20 mmol/L — ABNORMAL LOW (ref 22–32)
Calcium: 8.6 mg/dL — ABNORMAL LOW (ref 8.9–10.3)
Chloride: 93 mmol/L — ABNORMAL LOW (ref 98–111)
Creatinine, Ser: 9.35 mg/dL — ABNORMAL HIGH (ref 0.61–1.24)
GFR calc Af Amer: 6 mL/min — ABNORMAL LOW (ref >60)
GFR calc non Af Amer: 5 mL/min — ABNORMAL LOW (ref >60)
Glucose, Bld: 98 mg/dL (ref 70–99)
Phosphorus: 7.2 mg/dL — ABNORMAL HIGH (ref 2.5–4.6)
Potassium: 3.9 mmol/L (ref 3.5–5.1)
Sodium: 133 mmol/L — ABNORMAL LOW (ref 135–145)

## 2018-05-19 NOTE — Progress Notes (Addendum)
Pulmonary Critical Care Medicine [place]   PULMONARY CRITICAL CARE SERVICE  PROGRESS NOTE  Date of Service: 05/19/2018  DAKWAN PRIDGEN  [id number]  DOB: 1962-06-02   DOA: 04/26/2018  Referring Physician: Merton Border, MD  HPI: Jimmy Martin is a 62 y.o. male seen for follow up of Acute on Chronic Respiratory Failure.  Patient doing very well on trach collar 28%.  Secretions are minimal and patient denies any discomfort.  Medications: Reviewed on Rounds  Physical Exam:  Vitals: Pulse 94 respirations 16 blood pressure 91/56 O2 sat 95% temp 98.5  Ventilator Settings patient's not currently on ventilator  . General: Comfortable at this time . Eyes: Grossly normal lids, irises & conjunctiva . ENT: grossly tongue is normal . Neck: no obvious mass . Cardiovascular: S1 S2 normal no gallop . Respiratory: No wheezes or rhonchi noted . Abdomen: soft . Skin: no rash seen on limited exam . Musculoskeletal: not rigid . Psychiatric:unable to assess . Neurologic: no seizure no involuntary movements         Lab Data:   Basic Metabolic Panel: Recent Labs  Lab 05/13/18 0455 05/15/18 0556 05/17/18 0609 05/19/18 0633  NA 139 138 136 133*  K 3.7 3.5 3.6 3.9  CL 101 100 97* 93*  CO2 22 25 25  20*  GLUCOSE 107* 104* 106* 98  BUN 52* 78* 63* 74*  CREATININE 7.65* 9.59* 8.24* 9.35*  CALCIUM 8.7* 8.8* 9.1 8.6*  PHOS 5.1* 6.1* 6.3* 7.2*    ABG: No results for input(s): PHART, PCO2ART, PO2ART, HCO3, O2SAT in the last 168 hours.  Liver Function Tests: Recent Labs  Lab 05/13/18 0455 05/15/18 0556 05/17/18 0609 05/19/18 0633  ALBUMIN 2.4* 2.4* 2.6* 2.4*   No results for input(s): LIPASE, AMYLASE in the last 168 hours. No results for input(s): AMMONIA in the last 168 hours.  CBC: Recent Labs  Lab 05/13/18 0455 05/15/18 0556 05/17/18 0609  WBC 21.1* 19.3* 25.7*  HGB 10.1* 9.5* 10.8*  HCT 33.7* 30.9* 33.9*  MCV 95.7 94.2 94.2  PLT 430* 352 392     Cardiac Enzymes: No results for input(s): CKTOTAL, CKMB, CKMBINDEX, TROPONINI in the last 168 hours.  BNP (last 3 results) No results for input(s): BNP in the last 8760 hours.  ProBNP (last 3 results) No results for input(s): PROBNP in the last 8760 hours.  Radiological Exams: No results found.  Assessment/Plan Active Problems:   Tracheostomy tube present (HCC)   Stage 5 chronic kidney disease on chronic dialysis (HCC)   Encephalopathy acute   Acute on chronic respiratory failure with hypoxia (HCC)   Healthcare-associated pneumonia   Bleeding at insertion site   1. Acute on chronic respiratory failure with hypoxia continue trach collar trials at this time continue supportive care and pulmonary toilet. 2. 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 Encephalopathy resolved 4. Healthcare associated pneumonia treated and improved 5. Tracheostomy site with bleeding, improved continue to monitor   I have personally seen and evaluated the patient, evaluated laboratory and imaging results, formulated the assessment and plan and placed orders. The Patient requires high complexity decision making for assessment and support.  Case was discussed on Rounds with the Respiratory Therapy Staff  Allyne Gee, MD [facility] Pulmonary Critical Care Medicine Sleep Medicine

## 2018-05-20 NOTE — Progress Notes (Signed)
[facility]  ROUNDING NOTE   Subjective:  Patient underwent hemodialysis yesterday. Due for dialysis again tomorrow given the Christmas holiday. Resting in bed comfortably at the moment.  Objective:  Vital signs in last 24 hours:  Temperature 97.8 pulse 105 respirations 18 blood pressure 99/52  Physical Exam: General: Critically ill appearing  Head: Normocephalic, atraumatic. Moist oral mucosal membranes  Eyes: Anicteric  Neck: Tracheostomy in place  Lungs:  Scattered rhonchi, normal effort  Heart: S1S2 no rubs  Abdomen:  Soft, nontender, bowel sounds present  Extremities: trace peripheral edema.  Neurologic: Anxious  Skin: No lesions  Access: R IJ PC    Basic Metabolic Panel: Recent Labs  Lab 05/15/18 0556 05/17/18 0609 05/19/18 0633  NA 138 136 133*  K 3.5 3.6 3.9  CL 100 97* 93*  CO2 25 25 20*  GLUCOSE 104* 106* 98  BUN 78* 63* 74*  CREATININE 9.59* 8.24* 9.35*  CALCIUM 8.8* 9.1 8.6*  PHOS 6.1* 6.3* 7.2*    Liver Function Tests: Recent Labs  Lab 05/15/18 0556 05/17/18 0609 05/19/18 0633  ALBUMIN 2.4* 2.6* 2.4*   No results for input(s): LIPASE, AMYLASE in the last 168 hours. No results for input(s): AMMONIA in the last 168 hours.  CBC: Recent Labs  Lab 05/15/18 0556 05/17/18 0609  WBC 19.3* 25.7*  HGB 9.5* 10.8*  HCT 30.9* 33.9*  MCV 94.2 94.2  PLT 352 392    Cardiac Enzymes: No results for input(s): CKTOTAL, CKMB, CKMBINDEX, TROPONINI in the last 168 hours.  BNP: Invalid input(s): POCBNP  CBG: No results for input(s): GLUCAP in the last 168 hours.  Microbiology: Results for orders placed or performed during the hospital encounter of 04/26/18  Culture, respiratory     Status: None   Collection Time: 05/01/18  3:34 PM  Result Value Ref Range Status   Specimen Description TRACHEAL ASPIRATE  Final   Special Requests NONE  Final   Gram Stain   Final    MODERATE WBC PRESENT,BOTH PMN AND MONONUCLEAR MODERATE GRAM POSITIVE  COCCI IN PAIRS FEW GRAM NEGATIVE RODS MODERATE GRAM POSITIVE RODS RARE SQUAMOUS EPITHELIAL CELLS PRESENT Performed at [facility], [place] [address] [place], [place] [address]    Culture FEW METHICILLIN RESISTANT STAPHYLOCOCCUS AUREUS  Final   Report Status 05/04/2018 FINAL  Final   Organism ID, Bacteria METHICILLIN RESISTANT STAPHYLOCOCCUS AUREUS  Final      Susceptibility   Methicillin resistant staphylococcus aureus - MIC*    CIPROFLOXACIN >=8 RESISTANT Resistant     ERYTHROMYCIN <=0.25 SENSITIVE Sensitive     GENTAMICIN <=0.5 SENSITIVE Sensitive     OXACILLIN >=4 RESISTANT Resistant     TETRACYCLINE <=1 SENSITIVE Sensitive     VANCOMYCIN <=0.5 SENSITIVE Sensitive     TRIMETH/SULFA <=10 SENSITIVE Sensitive     CLINDAMYCIN <=0.25 SENSITIVE Sensitive     RIFAMPIN <=0.5 SENSITIVE Sensitive     Inducible Clindamycin NEGATIVE Sensitive     * FEW METHICILLIN RESISTANT STAPHYLOCOCCUS AUREUS  Culture, respiratory (non-expectorated)     Status: None   Collection Time: 05/04/18  2:27 PM  Result Value Ref Range Status   Specimen Description TRACHEAL ASPIRATE  Final   Special Requests NONE  Final   Gram Stain   Final    FEW WBC PRESENT, PREDOMINANTLY PMN FEW GRAM POSITIVE COCCI    Culture   Final    MODERATE METHICILLIN RESISTANT STAPHYLOCOCCUS AUREUS   Report Status 05/06/2018 FINAL  Final   Organism ID, Bacteria METHICILLIN  RESISTANT STAPHYLOCOCCUS AUREUS  Final      Susceptibility   Methicillin resistant staphylococcus aureus - MIC*    CIPROFLOXACIN >=8 RESISTANT Resistant     ERYTHROMYCIN <=0.25 SENSITIVE Sensitive     GENTAMICIN <=0.5 SENSITIVE Sensitive     OXACILLIN >=4 RESISTANT Resistant     TETRACYCLINE <=1 SENSITIVE Sensitive     VANCOMYCIN <=0.5 SENSITIVE Sensitive     TRIMETH/SULFA <=10 SENSITIVE Sensitive     CLINDAMYCIN <=0.25 SENSITIVE Sensitive     RIFAMPIN <=0.5 SENSITIVE Sensitive     Inducible Clindamycin NEGATIVE Sensitive     * MODERATE METHICILLIN  RESISTANT STAPHYLOCOCCUS AUREUS  C difficile quick scan w PCR reflex     Status: None   Collection Time: 05/10/18 10:48 AM  Result Value Ref Range Status   C Diff antigen NEGATIVE NEGATIVE Final   C Diff toxin NEGATIVE NEGATIVE Final   C Diff interpretation No C. difficile detected.  Final    Comment: Performed at [facility], [place] [address] [place], [place] [address]  Culture, respiratory (non-expectorated)     Status: None (Preliminary result)   Collection Time: 05/18/18  1:25 PM  Result Value Ref Range Status   Specimen Description TRACHEAL ASPIRATE  Final   Special Requests NONE  Final   Gram Stain   Final    ABUNDANT WBC PRESENT, PREDOMINANTLY PMN MODERATE GRAM NEGATIVE RODS RARE GRAM POSITIVE RODS RARE SQUAMOUS EPITHELIAL CELLS PRESENT    Culture   Final    CULTURE REINCUBATED FOR BETTER GROWTH Performed at [facility], [place] [address] [place], [place] [address]    Report Status PENDING  Incomplete    Coagulation Studies: No results for input(s): LABPROT, INR in the last 72 hours.  Urinalysis: No results for input(s): COLORURINE, LABSPEC, PHURINE, GLUCOSEU, HGBUR, BILIRUBINUR, KETONESUR, PROTEINUR, UROBILINOGEN, NITRITE, LEUKOCYTESUR in the last 72 hours.  Invalid input(s): APPERANCEUR    Imaging: No results found.   Medications:       Assessment/ Plan:  62 y.o. 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  1.  ESRD on HD.    Patient due for dialysis tomorrow.  No acute indication for dialysis today.  He is off his regular schedule secondary to the Christmas holiday.  2.  Acute respiratory failure.    Remains on trach collar at the moment.  Appears  to be tolerating this well.  3.  Secondary hyperparathyroidism.    Phosphorus currently 7.2.  Repeat parameters tomorrow.  May need to add binders.  4.  Anemia of chronic kidney disease.  Hemoglobin 10.8 at last check.  Recheck tomorrow.   LOS: 0 Anni Hocevar 12/23/20199:18 AM

## 2018-05-20 NOTE — Progress Notes (Signed)
Pulmonary Critical Care Medicine [place]   PULMONARY CRITICAL CARE SERVICE  PROGRESS NOTE  Date of Service: 05/20/2018  Jimmy Martin  RWE:[id number]  DOB: 1955-12-04   DOA: 04/26/2018  Referring Physician: Merton Border, MD  HPI: Jimmy Martin is a 62 y.o. male seen for follow up of Acute on Chronic Respiratory Failure.  He is doing well has been on T collar good saturations are noted at this time.  Medications: Reviewed on Rounds  Physical Exam:  Vitals: Temperature 97.8 pulse 105 respiratory rate 18 blood pressure 97/52 saturations 97%  Ventilator Settings currently is off the ventilator on T collar 28% FiO2  . General: Comfortable at this time . Eyes: Grossly normal lids, irises & conjunctiva . ENT: grossly tongue is normal . Neck: no obvious mass . Cardiovascular: S1 S2 normal no gallop . Respiratory: Good aeration no rhonchi or rales are noted . Abdomen: soft . Skin: no rash seen on limited exam . Musculoskeletal: not rigid . Psychiatric:unable to assess . Neurologic: no seizure no involuntary movements         Lab Data:   Basic Metabolic Panel: Recent Labs  Lab 05/15/18 0556 05/17/18 0609 05/19/18 0633  NA 138 136 133*  K 3.5 3.6 3.9  CL 100 97* 93*  CO2 25 25 20*  GLUCOSE 104* 106* 98  BUN 78* 63* 74*  CREATININE 9.59* 8.24* 9.35*  CALCIUM 8.8* 9.1 8.6*  PHOS 6.1* 6.3* 7.2*    ABG: No results for input(s): PHART, PCO2ART, PO2ART, HCO3, O2SAT in the last 168 hours.  Liver Function Tests: Recent Labs  Lab 05/15/18 0556 05/17/18 0609 05/19/18 0633  ALBUMIN 2.4* 2.6* 2.4*   No results for input(s): LIPASE, AMYLASE in the last 168 hours. No results for input(s): AMMONIA in the last 168 hours.  CBC: Recent Labs  Lab 05/15/18 0556 05/17/18 0609  WBC 19.3* 25.7*  HGB 9.5* 10.8*  HCT 30.9* 33.9*  MCV 94.2 94.2  PLT 352 392    Cardiac Enzymes: No results for input(s): CKTOTAL, CKMB, CKMBINDEX, TROPONINI in the  last 168 hours.  BNP (last 3 results) No results for input(s): BNP in the last 8760 hours.  ProBNP (last 3 results) No results for input(s): PROBNP in the last 8760 hours.  Radiological Exams: No results found.  Assessment/Plan Active Problems:   Tracheostomy tube present (HCC)   Stage 5 chronic kidney disease on chronic dialysis (HCC)   Encephalopathy acute   Acute on chronic respiratory failure with hypoxia (HCC)   Healthcare-associated pneumonia   Bleeding at insertion site   1. Acute on chronic respiratory failure with hypoxia we will continue with T collar at this time.  We will go ahead and change his trach out to a cuffless 5 XLT 2. Stage V end-stage renal disease on dialysis we will continue present management 3. Acute encephalopathy improving 4. Healthcare associated pneumonia treated we will continue present management 5. Tracheostomy as above we will change it out no further bleeding has been noted at this time   I have personally seen and evaluated the patient, evaluated laboratory and imaging results, formulated the assessment and plan and placed orders. The Patient requires high complexity decision making for assessment and support.  Case was discussed on Rounds with the Respiratory Therapy Staff  Allyne Gee, MD [facility] Pulmonary Critical Care Medicine Sleep Medicine

## 2018-05-21 LAB — RENAL FUNCTION PANEL
ALBUMIN: 2.4 g/dL — AB (ref 3.5–5.0)
Albumin: 2.4 g/dL — ABNORMAL LOW (ref 3.5–5.0)
Anion gap: 13 (ref 5–15)
Anion gap: 13 (ref 5–15)
BUN: 62 mg/dL — ABNORMAL HIGH (ref 6–20)
BUN: 33 mg/dL — ABNORMAL HIGH (ref 6–20)
CALCIUM: 8.4 mg/dL — AB (ref 8.9–10.3)
CO2: 26 mmol/L (ref 22–32)
CO2: 25 mmol/L (ref 22–32)
CREATININE: 5.02 mg/dL — AB (ref 0.61–1.24)
Calcium: 8.9 mg/dL (ref 8.9–10.3)
Chloride: 95 mmol/L — ABNORMAL LOW (ref 98–111)
Chloride: 95 mmol/L — ABNORMAL LOW (ref 98–111)
Creatinine, Ser: 8.21 mg/dL — ABNORMAL HIGH (ref 0.61–1.24)
GFR calc Af Amer: 7 mL/min — ABNORMAL LOW (ref >60)
GFR calc Af Amer: 13 mL/min — ABNORMAL LOW (ref >60)
GFR calc non Af Amer: 6 mL/min — ABNORMAL LOW (ref >60)
GFR calc non Af Amer: 12 mL/min — ABNORMAL LOW (ref >60)
Glucose, Bld: 93 mg/dL (ref 70–99)
Glucose, Bld: 94 mg/dL (ref 70–99)
Phosphorus: 7.7 mg/dL — ABNORMAL HIGH (ref 2.5–4.6)
Phosphorus: 4.5 mg/dL (ref 2.5–4.6)
Potassium: 3.6 mmol/L (ref 3.5–5.1)
Potassium: 4.2 mmol/L (ref 3.5–5.1)
SODIUM: 133 mmol/L — AB (ref 135–145)
Sodium: 134 mmol/L — ABNORMAL LOW (ref 135–145)

## 2018-05-21 LAB — CBC
HCT: 31.2 % — ABNORMAL LOW (ref 39.0–52.0)
HEMATOCRIT: 29.5 % — AB (ref 39.0–52.0)
Hemoglobin: 9.3 g/dL — ABNORMAL LOW (ref 13.0–17.0)
Hemoglobin: 9.9 g/dL — ABNORMAL LOW (ref 13.0–17.0)
MCH: 30.1 pg (ref 26.0–34.0)
MCH: 29.7 pg (ref 26.0–34.0)
MCHC: 31.5 g/dL (ref 30.0–36.0)
MCHC: 31.7 g/dL (ref 30.0–36.0)
MCV: 95.5 fL (ref 80.0–100.0)
MCV: 93.7 fL (ref 80.0–100.0)
PLATELETS: 273 10*3/uL (ref 150–400)
Platelets: 365 10*3/uL (ref 150–400)
RBC: 3.09 MIL/uL — ABNORMAL LOW (ref 4.22–5.81)
RBC: 3.33 MIL/uL — ABNORMAL LOW (ref 4.22–5.81)
RDW: 15.4 % (ref 11.5–15.5)
RDW: 15.3 % (ref 11.5–15.5)
WBC: 20.5 10*3/uL — AB (ref 4.0–10.5)
WBC: 21.1 10*3/uL — ABNORMAL HIGH (ref 4.0–10.5)
nRBC: 0 % (ref 0.0–0.2)
nRBC: 0.1 % (ref 0.0–0.2)

## 2018-05-21 LAB — CULTURE, RESPIRATORY W GRAM STAIN

## 2018-05-21 NOTE — Progress Notes (Signed)
Pulmonary Critical Care Medicine [place]   PULMONARY CRITICAL CARE SERVICE  PROGRESS NOTE  Date of Service: 05/21/2018  Jimmy Martin  HTD:[id number]  DOB: 09-Dec-1955   DOA: 04/26/2018  Referring Physician: Merton Border, MD  HPI: Jimmy Martin is a 62 y.o. male seen for follow up of Acute on Chronic Respiratory Failure.  Patient is on T collar doing well currently is on 28% FiO2  Medications: Reviewed on Rounds  Physical Exam:  Vitals: Temperature 97.4 pulse 94 respiratory 20 blood pressure 128/47 saturations 98%  Ventilator Settings patient is on T collar FiO2 28%  . General: Comfortable at this time . Eyes: Grossly normal lids, irises & conjunctiva . ENT: grossly tongue is normal . Neck: no obvious mass . Cardiovascular: S1 S2 normal no gallop . Respiratory: No rhonchi no rales . Abdomen: soft . Skin: no rash seen on limited exam . Musculoskeletal: not rigid . Psychiatric:unable to assess . Neurologic: no seizure no involuntary movements         Lab Data:   Basic Metabolic Panel: Recent Labs  Lab 05/15/18 0556 05/17/18 0609 05/19/18 0633 05/21/18 0549  NA 138 136 133* 134*  K 3.5 3.6 3.9 3.6  CL 100 97* 93* 95*  CO2 25 25 20* 26  GLUCOSE 104* 106* 98 93  BUN 78* 63* 74* 62*  CREATININE 9.59* 8.24* 9.35* 8.21*  CALCIUM 8.8* 9.1 8.6* 8.9  PHOS 6.1* 6.3* 7.2* 7.7*    ABG: No results for input(s): PHART, PCO2ART, PO2ART, HCO3, O2SAT in the last 168 hours.  Liver Function Tests: Recent Labs  Lab 05/15/18 0556 05/17/18 0609 05/19/18 0633 05/21/18 0549  ALBUMIN 2.4* 2.6* 2.4* 2.4*   No results for input(s): LIPASE, AMYLASE in the last 168 hours. No results for input(s): AMMONIA in the last 168 hours.  CBC: Recent Labs  Lab 05/15/18 0556 05/17/18 0609 05/21/18 0549  WBC 19.3* 25.7* 20.5*  HGB 9.5* 10.8* 9.3*  HCT 30.9* 33.9* 29.5*  MCV 94.2 94.2 95.5  PLT 352 392 365    Cardiac Enzymes: No results for input(s):  CKTOTAL, CKMB, CKMBINDEX, TROPONINI in the last 168 hours.  BNP (last 3 results) No results for input(s): BNP in the last 8760 hours.  ProBNP (last 3 results) No results for input(s): PROBNP in the last 8760 hours.  Radiological Exams: No results found.  Assessment/Plan Active Problems:   Tracheostomy tube present (HCC)   Stage 5 chronic kidney disease on chronic dialysis (HCC)   Encephalopathy acute   Acute on chronic respiratory failure with hypoxia (HCC)   Healthcare-associated pneumonia   Bleeding at insertion site   1. Acute on chronic respiratory failure with hypoxia we will continue with T collar trials and I would like to try to do capping on him see if he is able to tolerate 2. Stage V end-stage kidney disease on dialysis 3. Acute encephalopathy improved 4. Healthcare associated pneumonia treated 5. Bleeding site resolved from the tracheostomy   I have personally seen and evaluated the patient, evaluated laboratory and imaging results, formulated the assessment and plan and placed orders. The Patient requires high complexity decision making for assessment and support.  Case was discussed on Rounds with the Respiratory Therapy Staff  Allyne Gee, MD [facility] Pulmonary Critical Care Medicine Sleep Medicine

## 2018-05-22 LAB — HEPATITIS B SURFACE ANTIGEN: HEP B S AG: NEGATIVE

## 2018-05-22 NOTE — Progress Notes (Signed)
[facility]  ROUNDING NOTE   Subjective:  Patient had hemodialysis yesterday. Next dialysis scheduled for Friday. Resting comfortably in bed at the moment.  Objective:  Vital signs in last 24 hours:  Temperature 98.4 pulse 90 respirations 14 blood pressure 102/55  Physical Exam: General: No acute distress  Head: Normocephalic, atraumatic. Moist oral mucosal membranes  Eyes: Anicteric  Neck: Tracheostomy in place  Lungs:  Scattered rhonchi, normal effort  Heart: S1S2 no rubs  Abdomen:  Soft, nontender, bowel sounds present  Extremities: trace peripheral edema.  Neurologic: Awake, alert, follows commands  Skin: No lesions  Access: R IJ PC    Basic Metabolic Panel: Recent Labs  Lab 05/17/18 0609 05/19/18 0633 05/21/18 0549 05/21/18 1942  NA 136 133* 134* 133*  K 3.6 3.9 3.6 4.2  CL 97* 93* 95* 95*  CO2 25 20* 26 25  GLUCOSE 106* 98 93 94  BUN 63* 74* 62* 33*  CREATININE 8.24* 9.35* 8.21* 5.02*  CALCIUM 9.1 8.6* 8.9 8.4*  PHOS 6.3* 7.2* 7.7* 4.5    Liver Function Tests: Recent Labs  Lab 05/17/18 0609 05/19/18 0633 05/21/18 0549 05/21/18 1942  ALBUMIN 2.6* 2.4* 2.4* 2.4*   No results for input(s): LIPASE, AMYLASE in the last 168 hours. No results for input(s): AMMONIA in the last 168 hours.  CBC: Recent Labs  Lab 05/17/18 0609 05/21/18 0549 05/21/18 1942  WBC 25.7* 20.5* 21.1*  HGB 10.8* 9.3* 9.9*  HCT 33.9* 29.5* 31.2*  MCV 94.2 95.5 93.7  PLT 392 365 273    Cardiac Enzymes: No results for input(s): CKTOTAL, CKMB, CKMBINDEX, TROPONINI in the last 168 hours.  BNP: Invalid input(s): POCBNP  CBG: No results for input(s): GLUCAP in the last 168 hours.  Microbiology: Results for orders placed or performed during the hospital encounter of 04/26/18  Culture, respiratory     Status: None   Collection Time: 05/01/18  3:34 PM  Result Value Ref Range Status   Specimen Description TRACHEAL ASPIRATE  Final   Special Requests NONE  Final    Gram Stain   Final    MODERATE WBC PRESENT,BOTH PMN AND MONONUCLEAR MODERATE GRAM POSITIVE COCCI IN PAIRS FEW GRAM NEGATIVE RODS MODERATE GRAM POSITIVE RODS RARE SQUAMOUS EPITHELIAL CELLS PRESENT Performed at [facility], [place] [address], [place], [place] [address]    Culture FEW METHICILLIN RESISTANT STAPHYLOCOCCUS AUREUS  Final   Report Status 05/04/2018 FINAL  Final   Organism ID, Bacteria METHICILLIN RESISTANT STAPHYLOCOCCUS AUREUS  Final      Susceptibility   Methicillin resistant staphylococcus aureus - MIC*    CIPROFLOXACIN >=8 RESISTANT Resistant     ERYTHROMYCIN <=0.25 SENSITIVE Sensitive     GENTAMICIN <=0.5 SENSITIVE Sensitive     OXACILLIN >=4 RESISTANT Resistant     TETRACYCLINE <=1 SENSITIVE Sensitive     VANCOMYCIN <=0.5 SENSITIVE Sensitive     TRIMETH/SULFA <=10 SENSITIVE Sensitive     CLINDAMYCIN <=0.25 SENSITIVE Sensitive     RIFAMPIN <=0.5 SENSITIVE Sensitive     Inducible Clindamycin NEGATIVE Sensitive     * FEW METHICILLIN RESISTANT STAPHYLOCOCCUS AUREUS  Culture, respiratory (non-expectorated)     Status: None   Collection Time: 05/04/18  2:27 PM  Result Value Ref Range Status   Specimen Description TRACHEAL ASPIRATE  Final   Special Requests NONE  Final   Gram Stain   Final    FEW WBC PRESENT, PREDOMINANTLY PMN FEW GRAM POSITIVE COCCI    Culture   Final  MODERATE METHICILLIN RESISTANT STAPHYLOCOCCUS AUREUS   Report Status 05/06/2018 FINAL  Final   Organism ID, Bacteria METHICILLIN RESISTANT STAPHYLOCOCCUS AUREUS  Final      Susceptibility   Methicillin resistant staphylococcus aureus - MIC*    CIPROFLOXACIN >=8 RESISTANT Resistant     ERYTHROMYCIN <=0.25 SENSITIVE Sensitive     GENTAMICIN <=0.5 SENSITIVE Sensitive     OXACILLIN >=4 RESISTANT Resistant     TETRACYCLINE <=1 SENSITIVE Sensitive     VANCOMYCIN <=0.5 SENSITIVE Sensitive     TRIMETH/SULFA <=10 SENSITIVE Sensitive     CLINDAMYCIN <=0.25 SENSITIVE Sensitive     RIFAMPIN <=0.5  SENSITIVE Sensitive     Inducible Clindamycin NEGATIVE Sensitive     * MODERATE METHICILLIN RESISTANT STAPHYLOCOCCUS AUREUS  C difficile quick scan w PCR reflex     Status: None   Collection Time: 05/10/18 10:48 AM  Result Value Ref Range Status   C Diff antigen NEGATIVE NEGATIVE Final   C Diff toxin NEGATIVE NEGATIVE Final   C Diff interpretation No C. difficile detected.  Final    Comment: Performed at [facility], [place] [address], [place], [place] [address]  Culture, respiratory (non-expectorated)     Status: None   Collection Time: 05/18/18  1:25 PM  Result Value Ref Range Status   Specimen Description TRACHEAL ASPIRATE  Final   Special Requests NONE  Final   Gram Stain   Final    ABUNDANT WBC PRESENT, PREDOMINANTLY PMN MODERATE GRAM NEGATIVE RODS RARE GRAM POSITIVE RODS RARE SQUAMOUS EPITHELIAL CELLS PRESENT    Culture   Final    ABUNDANT HAEMOPHILUS PARAINFLUENZAE BETA LACTAMASE NEGATIVE Performed at [facility], [place] [address] [place], [place] [address]    Report Status 05/21/2018 FINAL  Final    Coagulation Studies: No results for input(s): LABPROT, INR in the last 72 hours.  Urinalysis: No results for input(s): COLORURINE, LABSPEC, PHURINE, GLUCOSEU, HGBUR, BILIRUBINUR, KETONESUR, PROTEINUR, UROBILINOGEN, NITRITE, LEUKOCYTESUR in the last 72 hours.  Invalid input(s): APPERANCEUR    Imaging: No results found.   Medications:       Assessment/ Plan:  62 y.o. 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  1.  ESRD on HD.    Patient completed dialysis yesterday.  No acute indication for dialysis today.  Next dialysis scheduled for Friday.  2.  Acute  respiratory failure.    Patient doing well off the ventilator.  Continue local trach care.  3.  Secondary hyperparathyroidism.    Phosphorus was down to 4.5 postdialysis but we will repeat parameters on Friday.  4.  Anemia of chronic kidney disease.  Hemoglobin currently acceptable at 9.9.   LOS: 0 Lucresia Simic 12/25/201911:17 AM

## 2018-05-22 NOTE — Progress Notes (Signed)
Pulmonary Critical Care Medicine [place]   PULMONARY CRITICAL CARE SERVICE  PROGRESS NOTE  Date of Service: 05/22/2018  STACIE KNUTZEN  MCN:[id number]  DOB: 04-22-1956   DOA: 04/26/2018  Referring Physician: Merton Border, MD  HPI: Jimmy Martin is a 62 y.o. male seen for follow up of Acute on Chronic Respiratory Failure.  Patient is comfortable right now without distress the patient is off the ventilator on T collar  Medications: Reviewed on Rounds  Physical Exam:  Vitals: Temperature 98.4 pulse 90 respiratory rate 14 blood pressure 102/55 saturations 100%  Ventilator Settings currently is on T collar FiO2 28% with the PMV  . General: Comfortable at this time . Eyes: Grossly normal lids, irises & conjunctiva . ENT: grossly tongue is normal . Neck: no obvious mass . Cardiovascular: S1 S2 normal no gallop . Respiratory: No rhonchi or rales are noted at this time . Abdomen: soft . Skin: no rash seen on limited exam . Musculoskeletal: not rigid . Psychiatric:unable to assess . Neurologic: no seizure no involuntary movements         Lab Data:   Basic Metabolic Panel: Recent Labs  Lab 05/17/18 0609 05/19/18 0633 05/21/18 0549 05/21/18 1942  NA 136 133* 134* 133*  K 3.6 3.9 3.6 4.2  CL 97* 93* 95* 95*  CO2 25 20* 26 25  GLUCOSE 106* 98 93 94  BUN 63* 74* 62* 33*  CREATININE 8.24* 9.35* 8.21* 5.02*  CALCIUM 9.1 8.6* 8.9 8.4*  PHOS 6.3* 7.2* 7.7* 4.5    ABG: No results for input(s): PHART, PCO2ART, PO2ART, HCO3, O2SAT in the last 168 hours.  Liver Function Tests: Recent Labs  Lab 05/17/18 0609 05/19/18 6294 05/21/18 0549 05/21/18 1942  ALBUMIN 2.6* 2.4* 2.4* 2.4*   No results for input(s): LIPASE, AMYLASE in the last 168 hours. No results for input(s): AMMONIA in the last 168 hours.  CBC: Recent Labs  Lab 05/17/18 0609 05/21/18 0549 05/21/18 1942  WBC 25.7* 20.5* 21.1*  HGB 10.8* 9.3* 9.9*  HCT 33.9* 29.5* 31.2*  MCV 94.2  95.5 93.7  PLT 392 365 273    Cardiac Enzymes: No results for input(s): CKTOTAL, CKMB, CKMBINDEX, TROPONINI in the last 168 hours.  BNP (last 3 results) No results for input(s): BNP in the last 8760 hours.  ProBNP (last 3 results) No results for input(s): PROBNP in the last 8760 hours.  Radiological Exams: No results found.  Assessment/Plan Active Problems:   Tracheostomy tube present (HCC)   Stage 5 chronic kidney disease on chronic dialysis (HCC)   Encephalopathy acute   Acute on chronic respiratory failure with hypoxia (HCC)   Healthcare-associated pneumonia   Bleeding at insertion site   1. Acute on chronic respiratory failure with hypoxia tolerating T collar spoke with respiratory therapy will try capping 2. 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 3. Acute encephalopathy grossly unchanged continue present management 4. Healthcare associated pneumonia resolved 5. Bleeding from tracheostomy resolved we will continue to monitor   I have personally seen and evaluated the patient, evaluated laboratory and imaging results, formulated the assessment and plan and placed orders. The Patient requires high complexity decision making for assessment and support.  Case was discussed on Rounds with the Respiratory Therapy Staff  Allyne Gee, MD [facility] Pulmonary Critical Care Medicine Sleep Medicine

## 2018-05-23 LAB — HEPATITIS PANEL, ACUTE
HCV Ab: <0.1 s/co ratio (ref 0.0–0.9)
HEP A IGM: NEGATIVE
Hep B C IgM: NEGATIVE
Hepatitis B Surface Ag: NEGATIVE

## 2018-05-23 NOTE — Progress Notes (Signed)
Pulmonary Critical Care Medicine [place]   PULMONARY CRITICAL CARE SERVICE  PROGRESS NOTE  Date of Service: 05/23/2018  ZAKHAI MEISINGER  LFY:[id number]  DOB: Jun 01, 1955   DOA: 04/26/2018  Referring Physician: Merton Border, MD  HPI: Jimmy Martin is a 62 y.o. male seen for follow up of Acute on Chronic Respiratory Failure.  Capping doing well awaiting discharge plan he needs to be discharged with the cath in place and follow-up with ENT  Medications: Reviewed on Rounds  Physical Exam:  Vitals: Temperature 98.2 pulse 90 respiratory rate 17 blood pressure 106/62 saturations 99%  Ventilator Settings capping off the ventilator  . General: Comfortable at this time . Eyes: Grossly normal lids, irises & conjunctiva . ENT: grossly tongue is normal . Neck: no obvious mass . Cardiovascular: S1 S2 normal no gallop . Respiratory: No rhonchi no rales are noted . Abdomen: soft . Skin: no rash seen on limited exam . Musculoskeletal: not rigid . Psychiatric:unable to assess . Neurologic: no seizure no involuntary movements         Lab Data:   Basic Metabolic Panel: Recent Labs  Lab 05/17/18 0609 05/19/18 0633 05/21/18 0549 05/21/18 1942  NA 136 133* 134* 133*  K 3.6 3.9 3.6 4.2  CL 97* 93* 95* 95*  CO2 25 20* 26 25  GLUCOSE 106* 98 93 94  BUN 63* 74* 62* 33*  CREATININE 8.24* 9.35* 8.21* 5.02*  CALCIUM 9.1 8.6* 8.9 8.4*  PHOS 6.3* 7.2* 7.7* 4.5    ABG: No results for input(s): PHART, PCO2ART, PO2ART, HCO3, O2SAT in the last 168 hours.  Liver Function Tests: Recent Labs  Lab 05/17/18 0609 05/19/18 8527 05/21/18 0549 05/21/18 1942  ALBUMIN 2.6* 2.4* 2.4* 2.4*   No results for input(s): LIPASE, AMYLASE in the last 168 hours. No results for input(s): AMMONIA in the last 168 hours.  CBC: Recent Labs  Lab 05/17/18 0609 05/21/18 0549 05/21/18 1942  WBC 25.7* 20.5* 21.1*  HGB 10.8* 9.3* 9.9*  HCT 33.9* 29.5* 31.2*  MCV 94.2 95.5 93.7  PLT  392 365 273    Cardiac Enzymes: No results for input(s): CKTOTAL, CKMB, CKMBINDEX, TROPONINI in the last 168 hours.  BNP (last 3 results) No results for input(s): BNP in the last 8760 hours.  ProBNP (last 3 results) No results for input(s): PROBNP in the last 8760 hours.  Radiological Exams: No results found.  Assessment/Plan Active Problems:   Tracheostomy tube present (HCC)   Stage 5 chronic kidney disease on chronic dialysis (HCC)   Encephalopathy acute   Acute on chronic respiratory failure with hypoxia (HCC)   Healthcare-associated pneumonia   Bleeding at insertion site   1. Acute on chronic respiratory failure with hypoxia making good progress with capping trials continue to advance. 2. Stage V kidney disease on dialysis continue with supportive care 3. Acute encephalopathy improved 4. Healthcare associated pneumonia treated 5. Bleeding from tracheostomy resolved we will continue to monitor   I have personally seen and evaluated the patient, evaluated laboratory and imaging results, formulated the assessment and plan and placed orders. The Patient requires high complexity decision making for assessment and support.  Case was discussed on Rounds with the Respiratory Therapy Staff  Allyne Gee, MD [facility] Pulmonary Critical Care Medicine Sleep Medicine

## 2018-05-24 ENCOUNTER — Other Ambulatory Visit (HOSPITAL_COMMUNITY): Payer: MEDICAID

## 2018-05-24 LAB — RENAL FUNCTION PANEL
Albumin: 2.5 g/dL — ABNORMAL LOW (ref 3.5–5.0)
Anion gap: 17 — ABNORMAL HIGH (ref 5–15)
BUN: 85 mg/dL — ABNORMAL HIGH (ref 6–20)
CO2: 23 mmol/L (ref 22–32)
Calcium: 8.7 mg/dL — ABNORMAL LOW (ref 8.9–10.3)
Chloride: 92 mmol/L — ABNORMAL LOW (ref 98–111)
Creatinine, Ser: 9.63 mg/dL — ABNORMAL HIGH (ref 0.61–1.24)
GFR calc Af Amer: 6 mL/min — ABNORMAL LOW (ref >60)
GFR calc non Af Amer: 5 mL/min — ABNORMAL LOW (ref >60)
Glucose, Bld: 104 mg/dL — ABNORMAL HIGH (ref 70–99)
Phosphorus: 8.6 mg/dL — ABNORMAL HIGH (ref 2.5–4.6)
Potassium: 3.7 mmol/L (ref 3.5–5.1)
Sodium: 132 mmol/L — ABNORMAL LOW (ref 135–145)

## 2018-05-24 LAB — CBC
HEMATOCRIT: 30 % — AB (ref 39.0–52.0)
HEMOGLOBIN: 9.5 g/dL — AB (ref 13.0–17.0)
MCH: 30.1 pg (ref 26.0–34.0)
MCHC: 31.7 g/dL (ref 30.0–36.0)
MCV: 94.9 fL (ref 80.0–100.0)
Platelets: 360 10*3/uL (ref 150–400)
RBC: 3.16 MIL/uL — ABNORMAL LOW (ref 4.22–5.81)
RDW: 15.6 % — ABNORMAL HIGH (ref 11.5–15.5)
WBC: 18.3 10*3/uL — ABNORMAL HIGH (ref 4.0–10.5)
nRBC: 0.2 % (ref 0.0–0.2)

## 2018-05-24 NOTE — Progress Notes (Signed)
Pulmonary Critical Care Medicine [place]   PULMONARY CRITICAL CARE SERVICE  PROGRESS NOTE  Date of Service: 05/24/2018  VESTAL MARKIN  KGY:[id number]  DOB: 06/11/1955   DOA: 04/26/2018  Referring Physician: Merton Border, MD  HPI: Jimmy Martin is a 62 y.o. male seen for follow up of Acute on Chronic Respiratory Failure.  Patient is capping doing fairly well no distress is noted at this time.  Respiratory therapy has been assessing him as far as his secretions and swallowing.  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  Might need to have a GI evaluation  Medications: Reviewed on Rounds  Physical Exam:  Vitals: Temperature 96.9 pulse 87 respiratory 14 blood pressure 116/61 saturations 97%  Ventilator Settings capping trials which will be continued.  . General: Comfortable at this time . Eyes: Grossly normal lids, irises & conjunctiva . ENT: grossly tongue is normal . Neck: no obvious mass . Cardiovascular: S1 S2 normal no gallop . Respiratory: No rhonchi or rales are noted at this time . Abdomen: soft . Skin: no rash seen on limited exam . Musculoskeletal: not rigid . Psychiatric:unable to assess . Neurologic: no seizure no involuntary movements         Lab Data:   Basic Metabolic Panel: Recent Labs  Lab 05/19/18 0633 05/21/18 0549 05/21/18 1942 05/24/18 0635  NA 133* 134* 133* 132*  K 3.9 3.6 4.2 3.7  CL 93* 95* 95* 92*  CO2 20* 26 25 23   GLUCOSE 98 93 94 104*  BUN 74* 62* 33* 85*  CREATININE 9.35* 8.21* 5.02* 9.63*  CALCIUM 8.6* 8.9 8.4* 8.7*  PHOS 7.2* 7.7* 4.5 8.6*    ABG: No results for input(s): PHART, PCO2ART, PO2ART, HCO3, O2SAT in the last 168 hours.  Liver Function Tests: Recent Labs  Lab 05/19/18 0263 05/21/18 0549 05/21/18 1942 05/24/18 0635  ALBUMIN 2.4* 2.4* 2.4* 2.5*   No results for input(s): LIPASE, AMYLASE in the last 168 hours. No results  for input(s): AMMONIA in the last 168 hours.  CBC: Recent Labs  Lab 05/21/18 0549 05/21/18 1942 05/24/18 0635  WBC 20.5* 21.1* 18.3*  HGB 9.3* 9.9* 9.5*  HCT 29.5* 31.2* 30.0*  MCV 95.5 93.7 94.9  PLT 365 273 360    Cardiac Enzymes: No results for input(s): CKTOTAL, CKMB, CKMBINDEX, TROPONINI in the last 168 hours.  BNP (last 3 results) No results for input(s): BNP in the last 8760 hours.  ProBNP (last 3 results) No results for input(s): PROBNP in the last 8760 hours.  Radiological Exams: No results found.  Assessment/Plan Active Problems:   Tracheostomy tube present (HCC)   Stage 5 chronic kidney disease on chronic dialysis (HCC)   Encephalopathy acute   Acute on chronic respiratory failure with hypoxia (HCC)   Healthcare-associated pneumonia   Bleeding at insertion site   1. Acute on chronic respiratory failure with hypoxia continue with capping trials continue secretion management pulmonary toilet. 2. 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 Acute encephalopathy resolved 4. Healthcare associated pneumonia treated we will continue to monitor 5. Tracheostomy with bleeding resolved we will continue present therapy   I have personally seen and evaluated the patient, evaluated laboratory and imaging results, formulated the assessment and plan and placed orders. The Patient requires high complexity decision making for assessment and support.  Case was discussed on Rounds with the Respiratory Therapy Staff  Allyne Gee, MD [facility]  Pulmonary Critical Care Medicine Sleep Medicine

## 2018-05-24 NOTE — Progress Notes (Signed)
[facility]  ROUNDING NOTE   Subjective:  Patient doing much better today. He just passed a modified barium swallow study. Overall in good spirits today.  Objective:  Vital signs in last 24 hours:  Temperature 96.9 pulse 87 respirations 14 blood pressure 116/61  Physical Exam: General: No acute distress  Head: Normocephalic, atraumatic. Moist oral mucosal membranes  Eyes: Anicteric  Neck: Tracheostomy in place and capped  Lungs:  Scattered rhonchi, normal effort  Heart: S1S2 no rubs  Abdomen:  Soft, nontender, bowel sounds present  Extremities: trace peripheral edema.  Neurologic: Awake, alert, follows commands  Skin: No lesions  Access: R IJ PC    Basic Metabolic Panel: Recent Labs  Lab 05/19/18 0633 05/21/18 0549 05/21/18 1942 05/24/18 0635  NA 133* 134* 133* 132*  K 3.9 3.6 4.2 3.7  CL 93* 95* 95* 92*  CO2 20* 26 25 23   GLUCOSE 98 93 94 104*  BUN 74* 62* 33* 85*  CREATININE 9.35* 8.21* 5.02* 9.63*  CALCIUM 8.6* 8.9 8.4* 8.7*  PHOS 7.2* 7.7* 4.5 8.6*    Liver Function Tests: Recent Labs  Lab 05/19/18 2409 05/21/18 0549 05/21/18 1942 05/24/18 0635  ALBUMIN 2.4* 2.4* 2.4* 2.5*   No results for input(s): LIPASE, AMYLASE in the last 168 hours. No results for input(s): AMMONIA in the last 168 hours.  CBC: Recent Labs  Lab 05/21/18 0549 05/21/18 1942 05/24/18 0635  WBC 20.5* 21.1* 18.3*  HGB 9.3* 9.9* 9.5*  HCT 29.5* 31.2* 30.0*  MCV 95.5 93.7 94.9  PLT 365 273 360    Cardiac Enzymes: No results for input(s): CKTOTAL, CKMB, CKMBINDEX, TROPONINI in the last 168 hours.  BNP: Invalid input(s): POCBNP  CBG: No results for input(s): GLUCAP in the last 168 hours.  Microbiology: Results for orders placed or performed during the hospital encounter of 04/26/18  Culture, respiratory     Status: None   Collection Time: 05/01/18  3:34 PM  Result Value Ref Range Status   Specimen Description TRACHEAL ASPIRATE  Final   Special Requests  NONE  Final   Gram Stain   Final    MODERATE WBC PRESENT,BOTH PMN AND MONONUCLEAR MODERATE GRAM POSITIVE COCCI IN PAIRS FEW GRAM NEGATIVE RODS MODERATE GRAM POSITIVE RODS RARE SQUAMOUS EPITHELIAL CELLS PRESENT Performed at [facility], [place] [address] [place], [place] [address]    Culture FEW METHICILLIN RESISTANT STAPHYLOCOCCUS AUREUS  Final   Report Status 05/04/2018 FINAL  Final   Organism ID, Bacteria METHICILLIN RESISTANT STAPHYLOCOCCUS AUREUS  Final      Susceptibility   Methicillin resistant staphylococcus aureus - MIC*    CIPROFLOXACIN >=8 RESISTANT Resistant     ERYTHROMYCIN <=0.25 SENSITIVE Sensitive     GENTAMICIN <=0.5 SENSITIVE Sensitive     OXACILLIN >=4 RESISTANT Resistant     TETRACYCLINE <=1 SENSITIVE Sensitive     VANCOMYCIN <=0.5 SENSITIVE Sensitive     TRIMETH/SULFA <=10 SENSITIVE Sensitive     CLINDAMYCIN <=0.25 SENSITIVE Sensitive     RIFAMPIN <=0.5 SENSITIVE Sensitive     Inducible Clindamycin NEGATIVE Sensitive     * FEW METHICILLIN RESISTANT STAPHYLOCOCCUS AUREUS  Culture, respiratory (non-expectorated)     Status: None   Collection Time: 05/04/18  2:27 PM  Result Value Ref Range Status   Specimen Description TRACHEAL ASPIRATE  Final   Special Requests NONE  Final   Gram Stain   Final    FEW WBC PRESENT, PREDOMINANTLY PMN FEW GRAM POSITIVE COCCI    Culture  Final    MODERATE METHICILLIN RESISTANT STAPHYLOCOCCUS AUREUS   Report Status 05/06/2018 FINAL  Final   Organism ID, Bacteria METHICILLIN RESISTANT STAPHYLOCOCCUS AUREUS  Final      Susceptibility   Methicillin resistant staphylococcus aureus - MIC*    CIPROFLOXACIN >=8 RESISTANT Resistant     ERYTHROMYCIN <=0.25 SENSITIVE Sensitive     GENTAMICIN <=0.5 SENSITIVE Sensitive     OXACILLIN >=4 RESISTANT Resistant     TETRACYCLINE <=1 SENSITIVE Sensitive     VANCOMYCIN <=0.5 SENSITIVE Sensitive     TRIMETH/SULFA <=10 SENSITIVE Sensitive     CLINDAMYCIN <=0.25 SENSITIVE Sensitive      RIFAMPIN <=0.5 SENSITIVE Sensitive     Inducible Clindamycin NEGATIVE Sensitive     * MODERATE METHICILLIN RESISTANT STAPHYLOCOCCUS AUREUS  C difficile quick scan w PCR reflex     Status: None   Collection Time: 05/10/18 10:48 AM  Result Value Ref Range Status   C Diff antigen NEGATIVE NEGATIVE Final   C Diff toxin NEGATIVE NEGATIVE Final   C Diff interpretation No C. difficile detected.  Final    Comment: Performed at [facility], [place] [address] [place], [place] [address]  Culture, respiratory (non-expectorated)     Status: None   Collection Time: 05/18/18  1:25 PM  Result Value Ref Range Status   Specimen Description TRACHEAL ASPIRATE  Final   Special Requests NONE  Final   Gram Stain   Final    ABUNDANT WBC PRESENT, PREDOMINANTLY PMN MODERATE GRAM NEGATIVE RODS RARE GRAM POSITIVE RODS RARE SQUAMOUS EPITHELIAL CELLS PRESENT    Culture   Final    ABUNDANT HAEMOPHILUS PARAINFLUENZAE BETA LACTAMASE NEGATIVE Performed at [facility], [place] [address] [place], [place] [address]    Report Status 05/21/2018 FINAL  Final    Coagulation Studies: No results for input(s): LABPROT, INR in the last 72 hours.  Urinalysis: No results for input(s): COLORURINE, LABSPEC, PHURINE, GLUCOSEU, HGBUR, BILIRUBINUR, KETONESUR, PROTEINUR, UROBILINOGEN, NITRITE, LEUKOCYTESUR in the last 72 hours.  Invalid input(s): APPERANCEUR    Imaging: No results found.   Medications:       Assessment/ Plan:  62 y.o. 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  1.  ESRD on HD.    Patient due for dialysis today.  Orders have been prepared.  After he completes dialysis today Next dialysis  treatment will be scheduled for Monday.  2.  Acute respiratory failure.    His tracheostomy is now capped.  He passed a modified barium swallow study today.  Patient progressing well.  3.  Secondary hyperparathyroidism.    Phosphorus high today at 8.6.  His tube feeds are to be adjusted.  We will continue to monitor.  4.  Anemia of chronic kidney disease.  Hemoglobin 9.5.  Continue to monitor CBC.   LOS: 0 Charmeka [place] 12/27/20199:33 AM

## 2018-05-25 NOTE — Progress Notes (Signed)
Pulmonary Critical Care Medicine [place]   PULMONARY CRITICAL CARE SERVICE  PROGRESS NOTE  Date of Service: 05/25/2018  Jimmy Martin  KGM:[id number]  DOB: 03/27/1956   DOA: 04/26/2018  Referring Physician: Merton Border, MD  HPI: Jimmy Martin is a 62 y.o. male seen for follow up of Acute on Chronic Respiratory Failure.  He remains capping doing well no issues noted overnight.  Currently is comfortable without distress  Medications: Reviewed on Rounds  Physical Exam:  Vitals: Temperature 98.7 pulse 107 respiratory 15 blood pressure 114/62 saturations 98%  Ventilator Settings off the ventilator capping  . General: Comfortable at this time . Eyes: Grossly normal lids, irises & conjunctiva . ENT: grossly tongue is normal . Neck: no obvious mass . Cardiovascular: S1 S2 normal no gallop . Respiratory: Coarse breath sounds with few rhonchi are noted . Abdomen: soft . Skin: no rash seen on limited exam . Musculoskeletal: not rigid . Psychiatric:unable to assess . Neurologic: no seizure no involuntary movements         Lab Data:   Basic Metabolic Panel: Recent Labs  Lab 05/19/18 0633 05/21/18 0549 05/21/18 1942 05/24/18 0635  NA 133* 134* 133* 132*  K 3.9 3.6 4.2 3.7  CL 93* 95* 95* 92*  CO2 20* 26 25 23   GLUCOSE 98 93 94 104*  BUN 74* 62* 33* 85*  CREATININE 9.35* 8.21* 5.02* 9.63*  CALCIUM 8.6* 8.9 8.4* 8.7*  PHOS 7.2* 7.7* 4.5 8.6*    ABG: No results for input(s): PHART, PCO2ART, PO2ART, HCO3, O2SAT in the last 168 hours.  Liver Function Tests: Recent Labs  Lab 05/19/18 6440 05/21/18 0549 05/21/18 1942 05/24/18 0635  ALBUMIN 2.4* 2.4* 2.4* 2.5*   No results for input(s): LIPASE, AMYLASE in the last 168 hours. No results for input(s): AMMONIA in the last 168 hours.  CBC: Recent Labs  Lab 05/21/18 0549 05/21/18 1942 05/24/18 0635  WBC 20.5* 21.1* 18.3*  HGB 9.3* 9.9* 9.5*  HCT 29.5* 31.2* 30.0*  MCV 95.5 93.7 94.9  PLT  365 273 360    Cardiac Enzymes: No results for input(s): CKTOTAL, CKMB, CKMBINDEX, TROPONINI in the last 168 hours.  BNP (last 3 results) No results for input(s): BNP in the last 8760 hours.  ProBNP (last 3 results) No results for input(s): PROBNP in the last 8760 hours.  Radiological Exams: No results found.  Assessment/Plan Active Problems:   Tracheostomy tube present (HCC)   Stage 5 chronic kidney disease on chronic dialysis (HCC)   Encephalopathy acute   Acute on chronic respiratory failure with hypoxia (HCC)   Healthcare-associated pneumonia   Bleeding at insertion site   1. Acute on chronic respiratory failure with hypoxia continue with capping trials continue pulmonary toilet supportive care 2. Stage V kidney disease on chronic dialysis 3. Encephalopathy cleared 4. Healthcare associated pneumonia improving 5. Tracheostomy with bleeding resolved patient has been changed over to a cuffless and has been capping fairly well   I have personally seen and evaluated the patient, evaluated laboratory and imaging results, formulated the assessment and plan and placed orders. The Patient requires high complexity decision making for assessment and support.  Case was discussed on Rounds with the Respiratory Therapy Staff  Allyne Gee, MD [facility] Pulmonary Critical Care Medicine Sleep Medicine

## 2018-05-26 NOTE — Progress Notes (Signed)
Pulmonary Critical Care Medicine [place]   PULMONARY CRITICAL CARE SERVICE  PROGRESS NOTE  Date of Service: 05/26/2018  Jimmy Martin  CZY:[id number]  DOB: [birth year]   DOA: 04/26/2018  Referring Physician: Merton Border, MD  HPI: Jimmy Martin is a 62 y.o. male seen for follow up of Acute on Chronic Respiratory Failure.  Patient is comfortable right now without distress remains capping currently is on room air  Medications: Reviewed on Rounds  Physical Exam:  Vitals: Temperature 97.1 pulse 105 respiratory rate 15 blood pressure 123/72 saturations 93%  Ventilator Settings capping off the ventilator  . General: Comfortable at this time . Eyes: Grossly normal lids, irises & conjunctiva . ENT: grossly tongue is normal . Neck: no obvious mass . Cardiovascular: S1 S2 normal no gallop . Respiratory: No rhonchi or rales are noted . Abdomen: soft . Skin: no rash seen on limited exam . Musculoskeletal: not rigid . Psychiatric:unable to assess . Neurologic: no seizure no involuntary movements         Lab Data:   Basic Metabolic Panel: Recent Labs  Lab 05/21/18 0549 05/21/18 1942 05/24/18 0635  NA 134* 133* 132*  K 3.6 4.2 3.7  CL 95* 95* 92*  CO2 26 25 23   GLUCOSE 93 94 104*  BUN 62* 33* 85*  CREATININE 8.21* 5.02* 9.63*  CALCIUM 8.9 8.4* 8.7*  PHOS 7.7* 4.5 8.6*    ABG: No results for input(s): PHART, PCO2ART, PO2ART, HCO3, O2SAT in the last 168 hours.  Liver Function Tests: Recent Labs  Lab 05/21/18 0549 05/21/18 1942 05/24/18 0635  ALBUMIN 2.4* 2.4* 2.5*   No results for input(s): LIPASE, AMYLASE in the last 168 hours. No results for input(s): AMMONIA in the last 168 hours.  CBC: Recent Labs  Lab 05/21/18 0549 05/21/18 1942 05/24/18 0635  WBC 20.5* 21.1* 18.3*  HGB 9.3* 9.9* 9.5*  HCT 29.5* 31.2* 30.0*  MCV 95.5 93.7 94.9  PLT 365 273 360    Cardiac Enzymes: No results for input(s): CKTOTAL, CKMB, CKMBINDEX, TROPONINI  in the last 168 hours.  BNP (last 3 results) No results for input(s): BNP in the last 8760 hours.  ProBNP (last 3 results) No results for input(s): PROBNP in the last 8760 hours.  Radiological Exams: No results found.  Assessment/Plan Active Problems:   Tracheostomy tube present (HCC)   Stage 5 chronic kidney disease on chronic dialysis (HCC)   Encephalopathy acute   Acute on chronic respiratory failure with hypoxia (HCC)   Healthcare-associated pneumonia   Bleeding at insertion site   1. Acute on chronic respiratory failure with hypoxia continue with capping trials as ordered 2. Stage V chronic kidney disease on dialysis followed by nephrology 3. Acute encephalopathy grossly unchanged continue present management 4. Healthcare associated pneumonia treated 5. Tracheostomy with bleeding needs formal ENT evaluation after discharge   I have personally seen and evaluated the patient, evaluated laboratory and imaging results, formulated the assessment and plan and placed orders. The Patient requires high complexity decision making for assessment and support.  Case was discussed on Rounds with the Respiratory Therapy Staff  Allyne Gee, MD [facility] Pulmonary Critical Care Medicine Sleep Medicine

## 2018-05-27 LAB — CBC
HCT: 29.8 % — ABNORMAL LOW (ref 39.0–52.0)
Hemoglobin: 9.5 g/dL — ABNORMAL LOW (ref 13.0–17.0)
MCH: 30.1 pg (ref 26.0–34.0)
MCHC: 31.9 g/dL (ref 30.0–36.0)
MCV: 94.3 fL (ref 80.0–100.0)
PLATELETS: 377 10*3/uL (ref 150–400)
RBC: 3.16 MIL/uL — AB (ref 4.22–5.81)
RDW: 15.8 % — ABNORMAL HIGH (ref 11.5–15.5)
WBC: 15.5 10*3/uL — ABNORMAL HIGH (ref 4.0–10.5)
nRBC: 0 % (ref 0.0–0.2)

## 2018-05-27 LAB — RENAL FUNCTION PANEL
ALBUMIN: 2.4 g/dL — AB (ref 3.5–5.0)
Anion gap: 13 (ref 5–15)
BUN: 38 mg/dL — ABNORMAL HIGH (ref 6–20)
CALCIUM: 8.6 mg/dL — AB (ref 8.9–10.3)
CO2: 25 mmol/L (ref 22–32)
CREATININE: 7.07 mg/dL — AB (ref 0.61–1.24)
Chloride: 97 mmol/L — ABNORMAL LOW (ref 98–111)
GFR calc Af Amer: 9 mL/min — ABNORMAL LOW (ref >60)
GFR calc non Af Amer: 8 mL/min — ABNORMAL LOW (ref >60)
Glucose, Bld: 96 mg/dL (ref 70–99)
Phosphorus: 4.9 mg/dL — ABNORMAL HIGH (ref 2.5–4.6)
Potassium: 3.5 mmol/L (ref 3.5–5.1)
Sodium: 135 mmol/L (ref 135–145)

## 2018-05-27 NOTE — Progress Notes (Signed)
Pulmonary Critical Care Medicine [place]   PULMONARY CRITICAL CARE SERVICE  PROGRESS NOTE  Date of Service: 05/27/2018  Jimmy Martin  OQH:[id number]  DOB: 06/23/1955   DOA: 04/26/2018  Referring Physician: Merton Border, MD  HPI: Jimmy Martin is a 62 y.o. male seen for follow up of Acute on Chronic Respiratory Failure.  Patient right now is capping doing fairly well.  Has been tolerating capping patient does need ENT follow-up and he should not be decannulated at this time  Medications: Reviewed on Rounds  Physical Exam:  Vitals: Temperature 98.1 pulse 90 respiratory rate 10 blood pressure 124/68 saturations 97%  Ventilator Settings capping off the ventilator  . General: Comfortable at this time . Eyes: Grossly normal lids, irises & conjunctiva . ENT: grossly tongue is normal . Neck: no obvious mass . Cardiovascular: S1 S2 normal no gallop . Respiratory: No rhonchi or rales are noted at this time . Abdomen: soft . Skin: no rash seen on limited exam . Musculoskeletal: not rigid . Psychiatric:unable to assess . Neurologic: no seizure no involuntary movements         Lab Data:   Basic Metabolic Panel: Recent Labs  Lab 05/21/18 0549 05/21/18 1942 05/24/18 0635 05/27/18 0434 05/27/18 0810  NA 134* 133* 132* QUESTIONABLE RESULTS, RECOMMEND RECOLLECT TO VERIFY 135  K 3.6 4.2 3.7 QUESTIONABLE RESULTS, RECOMMEND RECOLLECT TO VERIFY 3.5  CL 95* 95* 92* QUESTIONABLE RESULTS, RECOMMEND RECOLLECT TO VERIFY 97*  CO2 26 25 23  QUESTIONABLE RESULTS, RECOMMEND RECOLLECT TO VERIFY 25  GLUCOSE 93 94 104* QUESTIONABLE RESULTS, RECOMMEND RECOLLECT TO VERIFY 96  BUN 62* 33* 85* QUESTIONABLE RESULTS, RECOMMEND RECOLLECT TO VERIFY 38*  CREATININE 8.21* 5.02* 9.63* QUESTIONABLE RESULTS, RECOMMEND RECOLLECT TO VERIFY 7.07*  CALCIUM 8.9 8.4* 8.7* QUESTIONABLE RESULTS, RECOMMEND RECOLLECT TO VERIFY 8.6*  PHOS 7.7* 4.5 8.6* QUESTIONABLE RESULTS, RECOMMEND RECOLLECT  TO VERIFY 4.9*    ABG: No results for input(s): PHART, PCO2ART, PO2ART, HCO3, O2SAT in the last 168 hours.  Liver Function Tests: Recent Labs  Lab 05/21/18 0549 05/21/18 1942 05/24/18 0635 05/27/18 0434 05/27/18 0810  ALBUMIN 2.4* 2.4* 2.5* QUESTIONABLE RESULTS, RECOMMEND RECOLLECT TO VERIFY 2.4*   No results for input(s): LIPASE, AMYLASE in the last 168 hours. No results for input(s): AMMONIA in the last 168 hours.  CBC: Recent Labs  Lab 05/21/18 0549 05/21/18 1942 05/24/18 0635 05/27/18 0434 05/27/18 0810  WBC 20.5* 21.1* 18.3* QUESTIONABLE RESULTS, RECOMMEND RECOLLECT TO VERIFY 15.5*  HGB 9.3* 9.9* 9.5* QUESTIONABLE RESULTS, RECOMMEND RECOLLECT TO VERIFY 9.5*  HCT 29.5* 31.2* 30.0* QUESTIONABLE RESULTS, RECOMMEND RECOLLECT TO VERIFY 29.8*  MCV 95.5 93.7 94.9 QUESTIONABLE RESULTS, RECOMMEND RECOLLECT TO VERIFY 94.3  PLT 365 273 360 QUESTIONABLE RESULTS, RECOMMEND RECOLLECT TO VERIFY 377    Cardiac Enzymes: No results for input(s): CKTOTAL, CKMB, CKMBINDEX, TROPONINI in the last 168 hours.  BNP (last 3 results) No results for input(s): BNP in the last 8760 hours.  ProBNP (last 3 results) No results for input(s): PROBNP in the last 8760 hours.  Radiological Exams: No results found.  Assessment/Plan Active Problems:   Tracheostomy tube present (HCC)   Stage 5 chronic kidney disease on chronic dialysis (HCC)   Encephalopathy acute   Acute on chronic respiratory failure with hypoxia (HCC)   Healthcare-associated pneumonia   Bleeding at insertion site   1. Acute on chronic respiratory failure with hypoxia we will continue with capping trials he needs follow-up with ENT try to get this arranged. 2. Stage  V kidney disease end-stage on dialysis 3. Acute encephalopathy improved 4. Healthcare associated pneumonia treated we will continue present management 5. Bleeding at trach site resolved   I have personally seen and evaluated the patient, evaluated laboratory  and imaging results, formulated the assessment and plan and placed orders. The Patient requires high complexity decision making for assessment and support.  Case was discussed on Rounds with the Respiratory Therapy Staff  Allyne Gee, MD [facility] Pulmonary Critical Care Medicine Sleep Medicine

## 2018-05-27 NOTE — Progress Notes (Signed)
[facility]  ROUNDING NOTE   Subjective:  Patient seen at bedside. Seen and evaluated during dialysis and tolerating well.   Objective:  Vital signs in last 24 hours:  Temperature 98.1 pulse 98 respirations 10 blood pressure 124/68  Physical Exam: General: No acute distress  Head: Normocephalic, atraumatic. Moist oral mucosal membranes  Eyes: Anicteric  Neck: Tracheostomy in place   Lungs:  Scattered rhonchi, normal effort  Heart: S1S2 no rubs  Abdomen:  Soft, nontender, bowel sounds present  Extremities: trace peripheral edema.  Neurologic: Awake, alert, follows commands  Skin: No lesions  Access: R IJ PC    Basic Metabolic Panel: Recent Labs  Lab 05/21/18 0549 05/21/18 1942 05/24/18 0635 05/27/18 0434  NA 134* 133* 132* 134*  K 3.6 4.2 3.7 4.0  CL 95* 95* 92* 103  CO2 26 25 23 22   GLUCOSE 93 94 104* 97  BUN 62* 33* 85* 11  CREATININE 8.21* 5.02* 9.63* 0.51*  CALCIUM 8.9 8.4* 8.7* 8.1*  PHOS 7.7* 4.5 8.6* 3.7    Liver Function Tests: Recent Labs  Lab 05/21/18 0549 05/21/18 1942 05/24/18 0635 05/27/18 0434  ALBUMIN 2.4* 2.4* 2.5* 2.5*   No results for input(s): LIPASE, AMYLASE in the last 168 hours. No results for input(s): AMMONIA in the last 168 hours.  CBC: Recent Labs  Lab 05/21/18 0549 05/21/18 1942 05/24/18 0635 05/27/18 0434  WBC 20.5* 21.1* 18.3* 6.4  HGB 9.3* 9.9* 9.5* 10.0*  HCT 29.5* 31.2* 30.0* 30.7*  MCV 95.5 93.7 94.9 85.3  PLT 365 273 360 291    Cardiac Enzymes: No results for input(s): CKTOTAL, CKMB, CKMBINDEX, TROPONINI in the last 168 hours.  BNP: Invalid input(s): POCBNP  CBG: No results for input(s): GLUCAP in the last 168 hours.  Microbiology: Results for orders placed or performed during the hospital encounter of 04/26/18  Culture, respiratory     Status: None   Collection Time: 05/01/18  3:34 PM  Result Value Ref Range Status   Specimen Description TRACHEAL ASPIRATE  Final   Special Requests NONE   Final   Gram Stain   Final    MODERATE WBC PRESENT,BOTH PMN AND MONONUCLEAR MODERATE GRAM POSITIVE COCCI IN PAIRS FEW GRAM NEGATIVE RODS MODERATE GRAM POSITIVE RODS RARE SQUAMOUS EPITHELIAL CELLS PRESENT Performed at [facility], [place] [address] [place], [place] [address]    Culture FEW METHICILLIN RESISTANT STAPHYLOCOCCUS AUREUS  Final   Report Status 05/04/2018 FINAL  Final   Organism ID, Bacteria METHICILLIN RESISTANT STAPHYLOCOCCUS AUREUS  Final      Susceptibility   Methicillin resistant staphylococcus aureus - MIC*    CIPROFLOXACIN >=8 RESISTANT Resistant     ERYTHROMYCIN <=0.25 SENSITIVE Sensitive     GENTAMICIN <=0.5 SENSITIVE Sensitive     OXACILLIN >=4 RESISTANT Resistant     TETRACYCLINE <=1 SENSITIVE Sensitive     VANCOMYCIN <=0.5 SENSITIVE Sensitive     TRIMETH/SULFA <=10 SENSITIVE Sensitive     CLINDAMYCIN <=0.25 SENSITIVE Sensitive     RIFAMPIN <=0.5 SENSITIVE Sensitive     Inducible Clindamycin NEGATIVE Sensitive     * FEW METHICILLIN RESISTANT STAPHYLOCOCCUS AUREUS  Culture, respiratory (non-expectorated)     Status: None   Collection Time: 05/04/18  2:27 PM  Result Value Ref Range Status   Specimen Description TRACHEAL ASPIRATE  Final   Special Requests NONE  Final   Gram Stain   Final    FEW WBC PRESENT, PREDOMINANTLY PMN FEW GRAM POSITIVE COCCI    Culture  Final    MODERATE METHICILLIN RESISTANT STAPHYLOCOCCUS AUREUS   Report Status 05/06/2018 FINAL  Final   Organism ID, Bacteria METHICILLIN RESISTANT STAPHYLOCOCCUS AUREUS  Final      Susceptibility   Methicillin resistant staphylococcus aureus - MIC*    CIPROFLOXACIN >=8 RESISTANT Resistant     ERYTHROMYCIN <=0.25 SENSITIVE Sensitive     GENTAMICIN <=0.5 SENSITIVE Sensitive     OXACILLIN >=4 RESISTANT Resistant     TETRACYCLINE <=1 SENSITIVE Sensitive     VANCOMYCIN <=0.5 SENSITIVE Sensitive     TRIMETH/SULFA <=10 SENSITIVE Sensitive     CLINDAMYCIN <=0.25 SENSITIVE Sensitive     RIFAMPIN  <=0.5 SENSITIVE Sensitive     Inducible Clindamycin NEGATIVE Sensitive     * MODERATE METHICILLIN RESISTANT STAPHYLOCOCCUS AUREUS  C difficile quick scan w PCR reflex     Status: None   Collection Time: 05/10/18 10:48 AM  Result Value Ref Range Status   C Diff antigen NEGATIVE NEGATIVE Final   C Diff toxin NEGATIVE NEGATIVE Final   C Diff interpretation No C. difficile detected.  Final    Comment: Performed at [facility], [place] [address], [place], [place] [address]  Culture, respiratory (non-expectorated)     Status: None   Collection Time: 05/18/18  1:25 PM  Result Value Ref Range Status   Specimen Description TRACHEAL ASPIRATE  Final   Special Requests NONE  Final   Gram Stain   Final    ABUNDANT WBC PRESENT, PREDOMINANTLY PMN MODERATE GRAM NEGATIVE RODS RARE GRAM POSITIVE RODS RARE SQUAMOUS EPITHELIAL CELLS PRESENT    Culture   Final    ABUNDANT HAEMOPHILUS PARAINFLUENZAE BETA LACTAMASE NEGATIVE Performed at [facility], [employer] [address], [place], [place] [address]    Report Status 05/21/2018 FINAL  Final    Coagulation Studies: No results for input(s): LABPROT, INR in the last 72 hours.  Urinalysis: No results for input(s): COLORURINE, LABSPEC, PHURINE, GLUCOSEU, HGBUR, BILIRUBINUR, KETONESUR, PROTEINUR, UROBILINOGEN, NITRITE, LEUKOCYTESUR in the last 72 hours.  Invalid input(s): APPERANCEUR    Imaging: No results found.   Medications:       Assessment/ Plan:  62 y.o. 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  1.  ESRD on HD.    Patient seen and evaluated during hemodialysis.  Tolerating well.  We plan to complete dialysis treatment today.  2.   Acute respiratory failure.    Continues to do well from a respiratory perspective.  Remains off of the ventilator.  3.  Secondary hyperparathyroidism.    Suspect labs today are erroneous as creatinine was very low at 0.5.  Repeat serum phosphorus today.  4.  Anemia of chronic kidney disease.  Hemoglobin was 10 this a.m. but we are rechecking given the fact that his chemistry profile appears erroneous.   LOS: 0 Christia Coaxum 12/30/20198:17 AM

## 2018-05-28 NOTE — Progress Notes (Signed)
Pulmonary Critical Care Medicine [place]   PULMONARY CRITICAL CARE SERVICE  PROGRESS NOTE  Date of Service: 05/28/2018  [name]  ZOX:[id number]  DOB: Oct 24, 1955   DOA: 04/26/2018  Referring Physician: Merton Border, MD  HPI: Jimmy Martin is a 62 y.o. male seen for follow up of Acute on Chronic Respiratory Failure.  Patient is comfortable right now without distress has been capping currently is on room air spoke with ENT he should not be decannulated at this time and needs to follow-up with his primary oncologist and ENT  Medications: Reviewed on Rounds  Physical Exam:  Vitals: Temperature 98.6 pulse 112 respiratory 25 blood pressure 140/63 saturations 95%  Ventilator Settings he is capping off the ventilator  . General: Comfortable at this time . Eyes: Grossly normal lids, irises & conjunctiva . ENT: grossly tongue is normal . Neck: no obvious mass . Cardiovascular: S1 S2 normal no gallop . Respiratory: Rales are noted at this time . Abdomen: soft . Skin: no rash seen on limited exam . Musculoskeletal: not rigid . Psychiatric:unable to assess . Neurologic: no seizure no involuntary movements         Lab Data:   Basic Metabolic Panel: Recent Labs  Lab 05/21/18 1942 05/24/18 0635 05/27/18 0434 05/27/18 0810  NA 133* 132* QUESTIONABLE RESULTS, RECOMMEND RECOLLECT TO VERIFY 135  K 4.2 3.7 QUESTIONABLE RESULTS, RECOMMEND RECOLLECT TO VERIFY 3.5  CL 95* 92* QUESTIONABLE RESULTS, RECOMMEND RECOLLECT TO VERIFY 97*  CO2 25 23 QUESTIONABLE RESULTS, RECOMMEND RECOLLECT TO VERIFY 25  GLUCOSE 94 104* QUESTIONABLE RESULTS, RECOMMEND RECOLLECT TO VERIFY 96  BUN 33* 85* QUESTIONABLE RESULTS, RECOMMEND RECOLLECT TO VERIFY 38*  CREATININE 5.02* 9.63* QUESTIONABLE RESULTS, RECOMMEND RECOLLECT TO VERIFY 7.07*  CALCIUM 8.4* 8.7* QUESTIONABLE RESULTS, RECOMMEND RECOLLECT TO VERIFY 8.6*  PHOS 4.5 8.6* QUESTIONABLE RESULTS, RECOMMEND RECOLLECT TO VERIFY  4.9*    ABG: No results for input(s): PHART, PCO2ART, PO2ART, HCO3, O2SAT in the last 168 hours.  Liver Function Tests: Recent Labs  Lab 05/21/18 1942 05/24/18 0635 05/27/18 0434 05/27/18 0810  ALBUMIN 2.4* 2.5* QUESTIONABLE RESULTS, RECOMMEND RECOLLECT TO VERIFY 2.4*   No results for input(s): LIPASE, AMYLASE in the last 168 hours. No results for input(s): AMMONIA in the last 168 hours.  CBC: Recent Labs  Lab 05/21/18 1942 05/24/18 0635 05/27/18 0434 05/27/18 0810  WBC 21.1* 18.3* QUESTIONABLE RESULTS, RECOMMEND RECOLLECT TO VERIFY 15.5*  HGB 9.9* 9.5* QUESTIONABLE RESULTS, RECOMMEND RECOLLECT TO VERIFY 9.5*  HCT 31.2* 30.0* QUESTIONABLE RESULTS, RECOMMEND RECOLLECT TO VERIFY 29.8*  MCV 93.7 94.9 QUESTIONABLE RESULTS, RECOMMEND RECOLLECT TO VERIFY 94.3  PLT 273 360 QUESTIONABLE RESULTS, RECOMMEND RECOLLECT TO VERIFY 377    Cardiac Enzymes: No results for input(s): CKTOTAL, CKMB, CKMBINDEX, TROPONINI in the last 168 hours.  BNP (last 3 results) No results for input(s): BNP in the last 8760 hours.  ProBNP (last 3 results) No results for input(s): PROBNP in the last 8760 hours.  Radiological Exams: No results found.  Assessment/Plan Active Problems:   Tracheostomy tube present (HCC)   Stage 5 chronic kidney disease on chronic dialysis (HCC)   Encephalopathy acute   Acute on chronic respiratory failure with hypoxia (HCC)   Healthcare-associated pneumonia   Bleeding at insertion site   1. Acute on chronic respiratory failure hypoxia continue with capping trials continue secretion management pulmonary toilet. 2. Stage V kidney disease end-stage we will continue present management. 3. Encephalopathy improved 4. Acute care associated pneumonia resolved 5. Tracheostomy bleeding resolved  I have personally seen and evaluated the patient, evaluated laboratory and imaging results, formulated the assessment and plan and placed orders. The Patient requires high  complexity decision making for assessment and support.  Case was discussed on Rounds with the Respiratory Therapy Staff  Allyne Gee, MD [facility] Pulmonary Critical Care Medicine Sleep Medicine

## 2018-05-29 LAB — RENAL FUNCTION PANEL
Albumin: 2.2 g/dL — ABNORMAL LOW (ref 3.5–5.0)
Anion gap: 11 (ref 5–15)
BUN: 30 mg/dL — ABNORMAL HIGH (ref 6–20)
CO2: 27 mmol/L (ref 22–32)
Calcium: 8.9 mg/dL (ref 8.9–10.3)
Chloride: 97 mmol/L — ABNORMAL LOW (ref 98–111)
Creatinine, Ser: 7.94 mg/dL — ABNORMAL HIGH (ref 0.61–1.24)
GFR calc Af Amer: 8 mL/min — ABNORMAL LOW (ref >60)
GFR calc non Af Amer: 7 mL/min — ABNORMAL LOW (ref >60)
Glucose, Bld: 108 mg/dL — ABNORMAL HIGH (ref 70–99)
Phosphorus: 6.9 mg/dL — ABNORMAL HIGH (ref 2.5–4.6)
Potassium: 4 mmol/L (ref 3.5–5.1)
SODIUM: 135 mmol/L (ref 135–145)

## 2018-05-29 LAB — CBC
HCT: 30.2 % — ABNORMAL LOW (ref 39.0–52.0)
Hemoglobin: 9.5 g/dL — ABNORMAL LOW (ref 13.0–17.0)
MCH: 30.3 pg (ref 26.0–34.0)
MCHC: 31.5 g/dL (ref 30.0–36.0)
MCV: 96.2 fL (ref 80.0–100.0)
Platelets: 372 10*3/uL (ref 150–400)
RBC: 3.14 MIL/uL — ABNORMAL LOW (ref 4.22–5.81)
RDW: 16.1 % — ABNORMAL HIGH (ref 11.5–15.5)
WBC: 19.5 10*3/uL — ABNORMAL HIGH (ref 4.0–10.5)
nRBC: 0 % (ref 0.0–0.2)

## 2018-05-29 NOTE — Progress Notes (Signed)
[facility]  ROUNDING NOTE   Subjective:  Patient due for hemodialysis today. Sitting up in chair at the moment. In good spirits today.   Objective:  Vital signs in last 24 hours:  Temperature 97.3 pulse 105 respirations 24 blood pressure 100/53  Physical Exam: General: No acute distress  Head: Normocephalic, atraumatic. Moist oral mucosal membranes  Eyes: Anicteric  Neck: Tracheostomy in place   Lungs:  Scattered rhonchi, normal effort  Heart: S1S2 no rubs  Abdomen:  Soft, nontender, bowel sounds present  Extremities: trace peripheral edema.  Neurologic: Awake, alert, follows commands  Skin: No lesions  Access: R IJ PC    Basic Metabolic Panel: Recent Labs  Lab 05/24/18 0635 05/27/18 0434 05/27/18 0810 05/29/18 0558  NA 132* QUESTIONABLE RESULTS, RECOMMEND RECOLLECT TO VERIFY 135 135  K 3.7 QUESTIONABLE RESULTS, RECOMMEND RECOLLECT TO VERIFY 3.5 4.0  CL 92* QUESTIONABLE RESULTS, RECOMMEND RECOLLECT TO VERIFY 97* 97*  CO2 23 QUESTIONABLE RESULTS, RECOMMEND RECOLLECT TO VERIFY 25 27  GLUCOSE 104* QUESTIONABLE RESULTS, RECOMMEND RECOLLECT TO VERIFY 96 108*  BUN 85* QUESTIONABLE RESULTS, RECOMMEND RECOLLECT TO VERIFY 38* 30*  CREATININE 9.63* QUESTIONABLE RESULTS, RECOMMEND RECOLLECT TO VERIFY 7.07* 7.94*  CALCIUM 8.7* QUESTIONABLE RESULTS, RECOMMEND RECOLLECT TO VERIFY 8.6* 8.9  PHOS 8.6* QUESTIONABLE RESULTS, RECOMMEND RECOLLECT TO VERIFY 4.9* 6.9*    Liver Function Tests: Recent Labs  Lab 05/24/18 0635 05/27/18 0434 05/27/18 0810 05/29/18 0558  ALBUMIN 2.5* QUESTIONABLE RESULTS, RECOMMEND RECOLLECT TO VERIFY 2.4* 2.2*   No results for input(s): LIPASE, AMYLASE in the last 168 hours. No results for input(s): AMMONIA in the last 168 hours.  CBC: Recent Labs  Lab 05/24/18 0635 05/27/18 0434 05/27/18 0810 05/29/18 0558  WBC 18.3* QUESTIONABLE RESULTS, RECOMMEND RECOLLECT TO VERIFY 15.5* 19.5*  HGB 9.5* QUESTIONABLE RESULTS, RECOMMEND  RECOLLECT TO VERIFY 9.5* 9.5*  HCT 30.0* QUESTIONABLE RESULTS, RECOMMEND RECOLLECT TO VERIFY 29.8* 30.2*  MCV 94.9 QUESTIONABLE RESULTS, RECOMMEND RECOLLECT TO VERIFY 94.3 96.2  PLT 360 QUESTIONABLE RESULTS, RECOMMEND RECOLLECT TO VERIFY 377 372    Cardiac Enzymes: No results for input(s): CKTOTAL, CKMB, CKMBINDEX, TROPONINI in the last 168 hours.  BNP: Invalid input(s): POCBNP  CBG: No results for input(s): GLUCAP in the last 168 hours.  Microbiology: Results for orders placed or performed during the hospital encounter of 04/26/18  Culture, respiratory     Status: None   Collection Time: 05/01/18  3:34 PM  Result Value Ref Range Status   Specimen Description TRACHEAL ASPIRATE  Final   Special Requests NONE  Final   Gram Stain   Final    MODERATE WBC PRESENT,BOTH PMN AND MONONUCLEAR MODERATE GRAM POSITIVE COCCI IN PAIRS FEW GRAM NEGATIVE RODS MODERATE GRAM POSITIVE RODS RARE SQUAMOUS EPITHELIAL CELLS PRESENT Performed at [facility], [place] [address], [place], [place] [address]    Culture FEW METHICILLIN RESISTANT STAPHYLOCOCCUS AUREUS  Final   Report Status 05/04/2018 FINAL  Final   Organism ID, Bacteria METHICILLIN RESISTANT STAPHYLOCOCCUS AUREUS  Final      Susceptibility   Methicillin resistant staphylococcus aureus - MIC*    CIPROFLOXACIN >=8 RESISTANT Resistant     ERYTHROMYCIN <=0.25 SENSITIVE Sensitive     GENTAMICIN <=0.5 SENSITIVE Sensitive     OXACILLIN >=4 RESISTANT Resistant     TETRACYCLINE <=1 SENSITIVE Sensitive     VANCOMYCIN <=0.5 SENSITIVE Sensitive     TRIMETH/SULFA <=10 SENSITIVE Sensitive     CLINDAMYCIN <=0.25 SENSITIVE Sensitive     RIFAMPIN <=0.5 SENSITIVE Sensitive  Inducible Clindamycin NEGATIVE Sensitive     * FEW METHICILLIN RESISTANT STAPHYLOCOCCUS AUREUS  Culture, respiratory (non-expectorated)     Status: None   Collection Time: 05/04/18  2:27 PM  Result Value Ref Range Status   Specimen Description TRACHEAL ASPIRATE  Final    Special Requests NONE  Final   Gram Stain   Final    FEW WBC PRESENT, PREDOMINANTLY PMN FEW GRAM POSITIVE COCCI    Culture   Final    MODERATE METHICILLIN RESISTANT STAPHYLOCOCCUS AUREUS   Report Status 05/06/2018 FINAL  Final   Organism ID, Bacteria METHICILLIN RESISTANT STAPHYLOCOCCUS AUREUS  Final      Susceptibility   Methicillin resistant staphylococcus aureus - MIC*    CIPROFLOXACIN >=8 RESISTANT Resistant     ERYTHROMYCIN <=0.25 SENSITIVE Sensitive     GENTAMICIN <=0.5 SENSITIVE Sensitive     OXACILLIN >=4 RESISTANT Resistant     TETRACYCLINE <=1 SENSITIVE Sensitive     VANCOMYCIN <=0.5 SENSITIVE Sensitive     TRIMETH/SULFA <=10 SENSITIVE Sensitive     CLINDAMYCIN <=0.25 SENSITIVE Sensitive     RIFAMPIN <=0.5 SENSITIVE Sensitive     Inducible Clindamycin NEGATIVE Sensitive     * MODERATE METHICILLIN RESISTANT STAPHYLOCOCCUS AUREUS  C difficile quick scan w PCR reflex     Status: None   Collection Time: 05/10/18 10:48 AM  Result Value Ref Range Status   C Diff antigen NEGATIVE NEGATIVE Final   C Diff toxin NEGATIVE NEGATIVE Final   C Diff interpretation No C. difficile detected.  Final    Comment: Performed at [facility], [place] [address] [place], [place] [address]  Culture, respiratory (non-expectorated)     Status: None   Collection Time: 05/18/18  1:25 PM  Result Value Ref Range Status   Specimen Description TRACHEAL ASPIRATE  Final   Special Requests NONE  Final   Gram Stain   Final    ABUNDANT WBC PRESENT, PREDOMINANTLY PMN MODERATE GRAM NEGATIVE RODS RARE GRAM POSITIVE RODS RARE SQUAMOUS EPITHELIAL CELLS PRESENT    Culture   Final    ABUNDANT HAEMOPHILUS PARAINFLUENZAE BETA LACTAMASE NEGATIVE Performed at [facility], [place] [address] [place], [place] [address]    Report Status 05/21/2018 FINAL  Final    Coagulation Studies: No results for input(s): LABPROT, INR in the last 72 hours.  Urinalysis: No results for input(s): COLORURINE,  LABSPEC, PHURINE, GLUCOSEU, HGBUR, BILIRUBINUR, KETONESUR, PROTEINUR, UROBILINOGEN, NITRITE, LEUKOCYTESUR in the last 72 hours.  Invalid input(s): APPERANCEUR    Imaging: No results found.   Medications:       Assessment/ Plan:  63 y.o. 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  1.  ESRD on HD.    Patient due for hemodialysis today.  Orders have been prepared.  2.  Acute respiratory failure.    Patient doing well off of the ventilator.  His tracheostomy is capped.  3.  Secondary hyperparathyroidism.    Phosphorus continues to fluctuate a bit.  Most recent serum phosphorus was 6.9.  This should come down with dialysis today.  4.  Anemia of chronic kidney disease.  Hemoglobin 9.5.  Continue periodically monitor.   LOS: 0 Jimmy Martin 1/1/202010:56 AM

## 2018-05-29 NOTE — Progress Notes (Addendum)
Pulmonary Critical Care Medicine [place]   PULMONARY CRITICAL CARE SERVICE  PROGRESS NOTE  Date of Service: 05/29/2018  Jimmy Martin  POE:[id number]  DOB: 05/25/1956   DOA: 04/26/2018  Referring Physician: Merton Border, MD  HPI: Jimmy Martin is a 63 y.o. male seen for follow up of Acute on Chronic Respiratory Failure.  Patient currently doing well remains on room air.  He is capping intermittently.  ENT reports patient should not be decannulated at this time.  Medications: Reviewed on Rounds  Physical Exam:  Vitals: Pulse 105 respirations 24 blood pressure 100/53 O2 sat 93% temp 97.3  Ventilator Settings patient is not currently on ventilator  . General: Comfortable at this time . Eyes: Grossly normal lids, irises & conjunctiva . ENT: grossly tongue is normal . Neck: no obvious mass . Cardiovascular: S1 S2 normal no gallop . Respiratory: No wheezes or rhonchi noted . Abdomen: soft . Skin: no rash seen on limited exam . Musculoskeletal: not rigid . Psychiatric:unable to assess . Neurologic: no seizure no involuntary movements         Lab Data:   Basic Metabolic Panel: Recent Labs  Lab 05/24/18 0635 05/27/18 0434 05/27/18 0810 05/29/18 0558  NA 132* QUESTIONABLE RESULTS, RECOMMEND RECOLLECT TO VERIFY 135 135  K 3.7 QUESTIONABLE RESULTS, RECOMMEND RECOLLECT TO VERIFY 3.5 4.0  CL 92* QUESTIONABLE RESULTS, RECOMMEND RECOLLECT TO VERIFY 97* 97*  CO2 23 QUESTIONABLE RESULTS, RECOMMEND RECOLLECT TO VERIFY 25 27  GLUCOSE 104* QUESTIONABLE RESULTS, RECOMMEND RECOLLECT TO VERIFY 96 108*  BUN 85* QUESTIONABLE RESULTS, RECOMMEND RECOLLECT TO VERIFY 38* 30*  CREATININE 9.63* QUESTIONABLE RESULTS, RECOMMEND RECOLLECT TO VERIFY 7.07* 7.94*  CALCIUM 8.7* QUESTIONABLE RESULTS, RECOMMEND RECOLLECT TO VERIFY 8.6* 8.9  PHOS 8.6* QUESTIONABLE RESULTS, RECOMMEND RECOLLECT TO VERIFY 4.9* 6.9*    ABG: No results for input(s): PHART, PCO2ART, PO2ART, HCO3,  O2SAT in the last 168 hours.  Liver Function Tests: Recent Labs  Lab 05/24/18 3154 05/27/18 0434 05/27/18 0810 05/29/18 0558  ALBUMIN 2.5* QUESTIONABLE RESULTS, RECOMMEND RECOLLECT TO VERIFY 2.4* 2.2*   No results for input(s): LIPASE, AMYLASE in the last 168 hours. No results for input(s): AMMONIA in the last 168 hours.  CBC: Recent Labs  Lab 05/24/18 0635 05/27/18 0434 05/27/18 0810 05/29/18 0558  WBC 18.3* QUESTIONABLE RESULTS, RECOMMEND RECOLLECT TO VERIFY 15.5* 19.5*  HGB 9.5* QUESTIONABLE RESULTS, RECOMMEND RECOLLECT TO VERIFY 9.5* 9.5*  HCT 30.0* QUESTIONABLE RESULTS, RECOMMEND RECOLLECT TO VERIFY 29.8* 30.2*  MCV 94.9 QUESTIONABLE RESULTS, RECOMMEND RECOLLECT TO VERIFY 94.3 96.2  PLT 360 QUESTIONABLE RESULTS, RECOMMEND RECOLLECT TO VERIFY 377 372    Cardiac Enzymes: No results for input(s): CKTOTAL, CKMB, CKMBINDEX, TROPONINI in the last 168 hours.  BNP (last 3 results) No results for input(s): BNP in the last 8760 hours.  ProBNP (last 3 results) No results for input(s): PROBNP in the last 8760 hours.  Radiological Exams: No results found.  Assessment/Plan Active Problems:   Tracheostomy tube present (HCC)   Stage 5 chronic kidney disease on chronic dialysis (HCC)   Encephalopathy acute   Acute on chronic respiratory failure with hypoxia (HCC)   Healthcare-associated pneumonia   Bleeding at insertion site   1. Acute on chronic respiratory failure with hypoxia continue with capping trials and continue secretion management and pulmonary toilet. 2. Stage V kidney disease end-stage continue present management 3. Encephalopathy improved 4. Acute care associated pneumonia resolved 5. Tracheostomy bleeding resolved per ENT do not decannulate patient at this time.  I have personally seen and evaluated the patient, evaluated laboratory and imaging results, formulated the assessment and plan and placed orders. The Patient requires high complexity decision  making for assessment and support.  Case was discussed on Rounds with the Respiratory Therapy Staff  Allyne Gee, MD [facility] Pulmonary Critical Care Medicine Sleep Medicine

## 2018-05-30 NOTE — Progress Notes (Signed)
Pulmonary Critical Care Medicine [place]   PULMONARY CRITICAL CARE SERVICE  PROGRESS NOTE  Date of Service: 05/30/2018  TAMER BAUGHMAN  AVW:[id number]  DOB: November 30, 1955   DOA: 04/26/2018  Referring Physician: Merton Border, MD  HPI: EEAN BUSS is a 63 y.o. male seen for follow up of Acute on Chronic Respiratory Failure.  Patient is comfortable right now without distress has been capping currently is on room air  Medications: Reviewed on Rounds  Physical Exam:  Vitals: Temperature 98.5 pulse 104 respiratory rate 16 blood pressure 110/69 saturations 99%  Ventilator Settings off the ventilator and capping  . General: Comfortable at this time . Eyes: Grossly normal lids, irises & conjunctiva . ENT: grossly tongue is normal . Neck: no obvious mass . Cardiovascular: S1 S2 normal no gallop . Respiratory: No rhonchi or rales are noted at this time . Abdomen: soft . Skin: no rash seen on limited exam . Musculoskeletal: not rigid . Psychiatric:unable to assess . Neurologic: no seizure no involuntary movements         Lab Data:   Basic Metabolic Panel: Recent Labs  Lab 05/24/18 0635 05/27/18 0434 05/27/18 0810 05/29/18 0558  NA 132* QUESTIONABLE RESULTS, RECOMMEND RECOLLECT TO VERIFY 135 135  K 3.7 QUESTIONABLE RESULTS, RECOMMEND RECOLLECT TO VERIFY 3.5 4.0  CL 92* QUESTIONABLE RESULTS, RECOMMEND RECOLLECT TO VERIFY 97* 97*  CO2 23 QUESTIONABLE RESULTS, RECOMMEND RECOLLECT TO VERIFY 25 27  GLUCOSE 104* QUESTIONABLE RESULTS, RECOMMEND RECOLLECT TO VERIFY 96 108*  BUN 85* QUESTIONABLE RESULTS, RECOMMEND RECOLLECT TO VERIFY 38* 30*  CREATININE 9.63* QUESTIONABLE RESULTS, RECOMMEND RECOLLECT TO VERIFY 7.07* 7.94*  CALCIUM 8.7* QUESTIONABLE RESULTS, RECOMMEND RECOLLECT TO VERIFY 8.6* 8.9  PHOS 8.6* QUESTIONABLE RESULTS, RECOMMEND RECOLLECT TO VERIFY 4.9* 6.9*    ABG: No results for input(s): PHART, PCO2ART, PO2ART, HCO3, O2SAT in the last 168  hours.  Liver Function Tests: Recent Labs  Lab 05/24/18 8295 05/27/18 0434 05/27/18 0810 05/29/18 0558  ALBUMIN 2.5* QUESTIONABLE RESULTS, RECOMMEND RECOLLECT TO VERIFY 2.4* 2.2*   No results for input(s): LIPASE, AMYLASE in the last 168 hours. No results for input(s): AMMONIA in the last 168 hours.  CBC: Recent Labs  Lab 05/24/18 0635 05/27/18 0434 05/27/18 0810 05/29/18 0558  WBC 18.3* QUESTIONABLE RESULTS, RECOMMEND RECOLLECT TO VERIFY 15.5* 19.5*  HGB 9.5* QUESTIONABLE RESULTS, RECOMMEND RECOLLECT TO VERIFY 9.5* 9.5*  HCT 30.0* QUESTIONABLE RESULTS, RECOMMEND RECOLLECT TO VERIFY 29.8* 30.2*  MCV 94.9 QUESTIONABLE RESULTS, RECOMMEND RECOLLECT TO VERIFY 94.3 96.2  PLT 360 QUESTIONABLE RESULTS, RECOMMEND RECOLLECT TO VERIFY 377 372    Cardiac Enzymes: No results for input(s): CKTOTAL, CKMB, CKMBINDEX, TROPONINI in the last 168 hours.  BNP (last 3 results) No results for input(s): BNP in the last 8760 hours.  ProBNP (last 3 results) No results for input(s): PROBNP in the last 8760 hours.  Radiological Exams: No results found.  Assessment/Plan Active Problems:   Tracheostomy tube present (HCC)   Stage 5 chronic kidney disease on chronic dialysis (HCC)   Encephalopathy acute   Acute on chronic respiratory failure with hypoxia (HCC)   Healthcare-associated pneumonia   Bleeding at insertion site   1. Acute on chronic respiratory failure with hypoxia we will continue with capping trials right now off of oxygen. 2. Stage V kidney disease on dialysis we will continue with supportive care 3. Encephalopathy unchanged 4. Healthcare associated pneumonia treated resolved 5. Bleeding resolved from the tracheostomy site   I have personally seen and evaluated the patient,  evaluated laboratory and imaging results, formulated the assessment and plan and placed orders. The Patient requires high complexity decision making for assessment and support.  Case was discussed on  Rounds with the Respiratory Therapy Staff  Allyne Gee, MD [facility] Pulmonary Critical Care Medicine Sleep Medicine

## 2018-05-31 LAB — RENAL FUNCTION PANEL
Albumin: 2.1 g/dL — ABNORMAL LOW (ref 3.5–5.0)
Anion gap: 14 (ref 5–15)
BUN: 33 mg/dL — ABNORMAL HIGH (ref 6–20)
CALCIUM: 8.9 mg/dL (ref 8.9–10.3)
CO2: 26 mmol/L (ref 22–32)
Chloride: 98 mmol/L (ref 98–111)
Creatinine, Ser: 9.13 mg/dL — ABNORMAL HIGH (ref 0.61–1.24)
GFR calc Af Amer: 7 mL/min — ABNORMAL LOW (ref >60)
GFR calc non Af Amer: 6 mL/min — ABNORMAL LOW (ref >60)
Glucose, Bld: 73 mg/dL (ref 70–99)
Phosphorus: 8.9 mg/dL — ABNORMAL HIGH (ref 2.5–4.6)
Potassium: 5.5 mmol/L — ABNORMAL HIGH (ref 3.5–5.1)
SODIUM: 138 mmol/L (ref 135–145)

## 2018-05-31 LAB — CBC
HCT: 31.2 % — ABNORMAL LOW (ref 39.0–52.0)
Hemoglobin: 9.4 g/dL — ABNORMAL LOW (ref 13.0–17.0)
MCH: 29.6 pg (ref 26.0–34.0)
MCHC: 30.1 g/dL (ref 30.0–36.0)
MCV: 98.1 fL (ref 80.0–100.0)
Platelets: 144 10*3/uL — ABNORMAL LOW (ref 150–400)
RBC: 3.18 MIL/uL — ABNORMAL LOW (ref 4.22–5.81)
RDW: 16 % — ABNORMAL HIGH (ref 11.5–15.5)
WBC: 17.9 10*3/uL — ABNORMAL HIGH (ref 4.0–10.5)
nRBC: 0 % (ref 0.0–0.2)

## 2018-05-31 NOTE — Progress Notes (Signed)
[facility]  ROUNDING NOTE   Subjective:  Patient seen and evaluated during hemodialysis. Tolerating well. Sitting up in chair.   Objective:  Vital signs in last 24 hours:  Temperature 97.8 pulse 103 respirations 18 blood pressure 107/55  Physical Exam: General: No acute distress  Head: Normocephalic, atraumatic. Moist oral mucosal membranes  Eyes: Anicteric  Neck: Tracheostomy in place   Lungs:  Scattered rhonchi, normal effort  Heart: S1S2 no rubs  Abdomen:  Soft, nontender, bowel sounds present  Extremities: trace peripheral edema.  Neurologic: Awake, alert, follows commands  Skin: No lesions  Access: R IJ PC    Basic Metabolic Panel: Recent Labs  Lab 05/27/18 0434 05/27/18 0810 05/29/18 0558  NA QUESTIONABLE RESULTS, RECOMMEND RECOLLECT TO VERIFY 135 135  K QUESTIONABLE RESULTS, RECOMMEND RECOLLECT TO VERIFY 3.5 4.0  CL QUESTIONABLE RESULTS, RECOMMEND RECOLLECT TO VERIFY 97* 97*  CO2 QUESTIONABLE RESULTS, RECOMMEND RECOLLECT TO VERIFY 25 27  GLUCOSE QUESTIONABLE RESULTS, RECOMMEND RECOLLECT TO VERIFY 96 108*  BUN QUESTIONABLE RESULTS, RECOMMEND RECOLLECT TO VERIFY 38* 30*  CREATININE QUESTIONABLE RESULTS, RECOMMEND RECOLLECT TO VERIFY 7.07* 7.94*  CALCIUM QUESTIONABLE RESULTS, RECOMMEND RECOLLECT TO VERIFY 8.6* 8.9  PHOS QUESTIONABLE RESULTS, RECOMMEND RECOLLECT TO VERIFY 4.9* 6.9*    Liver Function Tests: Recent Labs  Lab 05/27/18 0434 05/27/18 0810 05/29/18 0558  ALBUMIN QUESTIONABLE RESULTS, RECOMMEND RECOLLECT TO VERIFY 2.4* 2.2*   No results for input(s): LIPASE, AMYLASE in the last 168 hours. No results for input(s): AMMONIA in the last 168 hours.  CBC: Recent Labs  Lab 05/27/18 0434 05/27/18 0810 05/29/18 0558  WBC QUESTIONABLE RESULTS, RECOMMEND RECOLLECT TO VERIFY 15.5* 19.5*  HGB QUESTIONABLE RESULTS, RECOMMEND RECOLLECT TO VERIFY 9.5* 9.5*  HCT QUESTIONABLE RESULTS, RECOMMEND RECOLLECT TO VERIFY 29.8* 30.2*  MCV QUESTIONABLE  RESULTS, RECOMMEND RECOLLECT TO VERIFY 94.3 96.2  PLT QUESTIONABLE RESULTS, RECOMMEND RECOLLECT TO VERIFY 377 372    Cardiac Enzymes: No results for input(s): CKTOTAL, CKMB, CKMBINDEX, TROPONINI in the last 168 hours.  BNP: Invalid input(s): POCBNP  CBG: No results for input(s): GLUCAP in the last 168 hours.  Microbiology: Results for orders placed or performed during the hospital encounter of 04/26/18  Culture, respiratory     Status: None   Collection Time: 05/01/18  3:34 PM  Result Value Ref Range Status   Specimen Description TRACHEAL ASPIRATE  Final   Special Requests NONE  Final   Gram Stain   Final    MODERATE WBC PRESENT,BOTH PMN AND MONONUCLEAR MODERATE GRAM POSITIVE COCCI IN PAIRS FEW GRAM NEGATIVE RODS MODERATE GRAM POSITIVE RODS RARE SQUAMOUS EPITHELIAL CELLS PRESENT Performed at [facility], [place] [address], [place], [place] [address]    Culture FEW METHICILLIN RESISTANT STAPHYLOCOCCUS AUREUS  Final   Report Status 05/04/2018 FINAL  Final   Organism ID, Bacteria METHICILLIN RESISTANT STAPHYLOCOCCUS AUREUS  Final      Susceptibility   Methicillin resistant staphylococcus aureus - MIC*    CIPROFLOXACIN >=8 RESISTANT Resistant     ERYTHROMYCIN <=0.25 SENSITIVE Sensitive     GENTAMICIN <=0.5 SENSITIVE Sensitive     OXACILLIN >=4 RESISTANT Resistant     TETRACYCLINE <=1 SENSITIVE Sensitive     VANCOMYCIN <=0.5 SENSITIVE Sensitive     TRIMETH/SULFA <=10 SENSITIVE Sensitive     CLINDAMYCIN <=0.25 SENSITIVE Sensitive     RIFAMPIN <=0.5 SENSITIVE Sensitive     Inducible Clindamycin NEGATIVE Sensitive     * FEW METHICILLIN RESISTANT STAPHYLOCOCCUS AUREUS  Culture, respiratory (non-expectorated)  Status: None   Collection Time: 05/04/18  2:27 PM  Result Value Ref Range Status   Specimen Description TRACHEAL ASPIRATE  Final   Special Requests NONE  Final   Gram Stain   Final    FEW WBC PRESENT, PREDOMINANTLY PMN FEW GRAM POSITIVE COCCI    Culture   Final     MODERATE METHICILLIN RESISTANT STAPHYLOCOCCUS AUREUS   Report Status 05/06/2018 FINAL  Final   Organism ID, Bacteria METHICILLIN RESISTANT STAPHYLOCOCCUS AUREUS  Final      Susceptibility   Methicillin resistant staphylococcus aureus - MIC*    CIPROFLOXACIN >=8 RESISTANT Resistant     ERYTHROMYCIN <=0.25 SENSITIVE Sensitive     GENTAMICIN <=0.5 SENSITIVE Sensitive     OXACILLIN >=4 RESISTANT Resistant     TETRACYCLINE <=1 SENSITIVE Sensitive     VANCOMYCIN <=0.5 SENSITIVE Sensitive     TRIMETH/SULFA <=10 SENSITIVE Sensitive     CLINDAMYCIN <=0.25 SENSITIVE Sensitive     RIFAMPIN <=0.5 SENSITIVE Sensitive     Inducible Clindamycin NEGATIVE Sensitive     * MODERATE METHICILLIN RESISTANT STAPHYLOCOCCUS AUREUS  C difficile quick scan w PCR reflex     Status: None   Collection Time: 05/10/18 10:48 AM  Result Value Ref Range Status   C Diff antigen NEGATIVE NEGATIVE Final   C Diff toxin NEGATIVE NEGATIVE Final   C Diff interpretation No C. difficile detected.  Final    Comment: Performed at [facility], [place] [address], [place], [place] [address]  Culture, respiratory (non-expectorated)     Status: None   Collection Time: 05/18/18  1:25 PM  Result Value Ref Range Status   Specimen Description TRACHEAL ASPIRATE  Final   Special Requests NONE  Final   Gram Stain   Final    ABUNDANT WBC PRESENT, PREDOMINANTLY PMN MODERATE GRAM NEGATIVE RODS RARE GRAM POSITIVE RODS RARE SQUAMOUS EPITHELIAL CELLS PRESENT    Culture   Final    ABUNDANT HAEMOPHILUS PARAINFLUENZAE BETA LACTAMASE NEGATIVE Performed at [facility], [place] [address] [place], [place] [address]    Report Status 05/21/2018 FINAL  Final    Coagulation Studies: No results for input(s): LABPROT, INR in the last 72 hours.  Urinalysis: No results for input(s): COLORURINE, LABSPEC, PHURINE, GLUCOSEU, HGBUR, BILIRUBINUR, KETONESUR, PROTEINUR, UROBILINOGEN, NITRITE, LEUKOCYTESUR in the last 72 hours.  Invalid  input(s): APPERANCEUR    Imaging: No results found.   Medications:       Assessment/ Plan:  63 y.o. 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  1.  ESRD on HD.    Patient seen and evaluated during dialysis.  Tolerating well.  Sitting up in a chair.  2.  Acute respiratory failure.    Tracheostomy remains capped and patient is breathing comfortably at the moment.  Does have intermittent cough.  3.  Secondary hyperparathyroidism.    Awaiting repeat phosphorus today.  4.  Anemia of chronic kidney disease.  Hemoglobin close to target at 9.5.  Continue to monitor periodically.   LOS: 0 [place]avia Santor 1/3/20208:31 AM

## 2018-05-31 NOTE — Progress Notes (Signed)
Pulmonary Critical Care Medicine [place]   PULMONARY CRITICAL CARE SERVICE  PROGRESS NOTE  Date of Service: 05/31/2018  Jimmy Martin  LPF:[id number]  DOB: [birth year]   DOA: 04/26/2018  Referring Physician: Merton Border, MD  HPI: Jimmy Martin is a 63 y.o. male seen for follow up of Acute on Chronic Respiratory Failure.  Patient is capping right now no distress at this time.  Currently is on room air  Medications: Reviewed on Rounds  Physical Exam:  Vitals: Temperature 97.8 pulse 103 respiratory rate 18 blood pressure 107/55 saturations 96%  Ventilator Settings currently on capping trials  . General: Comfortable at this time . Eyes: Grossly normal lids, irises & conjunctiva . ENT: grossly tongue is normal . Neck: no obvious mass . Cardiovascular: S1 S2 normal no gallop . Respiratory: No rhonchi or rales are noted . Abdomen: soft . Skin: no rash seen on limited exam . Musculoskeletal: not rigid . Psychiatric:unable to assess . Neurologic: no seizure no involuntary movements         Lab Data:   Basic Metabolic Panel: Recent Labs  Lab 05/27/18 0434 05/27/18 0810 05/29/18 0558 05/31/18 0500  NA QUESTIONABLE RESULTS, RECOMMEND RECOLLECT TO VERIFY 135 135 138  K QUESTIONABLE RESULTS, RECOMMEND RECOLLECT TO VERIFY 3.5 4.0 5.5*  CL QUESTIONABLE RESULTS, RECOMMEND RECOLLECT TO VERIFY 97* 97* 98  CO2 QUESTIONABLE RESULTS, RECOMMEND RECOLLECT TO VERIFY 25 27 26   GLUCOSE QUESTIONABLE RESULTS, RECOMMEND RECOLLECT TO VERIFY 96 108* 73  BUN QUESTIONABLE RESULTS, RECOMMEND RECOLLECT TO VERIFY 38* 30* 33*  CREATININE QUESTIONABLE RESULTS, RECOMMEND RECOLLECT TO VERIFY 7.07* 7.94* 9.13*  CALCIUM QUESTIONABLE RESULTS, RECOMMEND RECOLLECT TO VERIFY 8.6* 8.9 8.9  PHOS QUESTIONABLE RESULTS, RECOMMEND RECOLLECT TO VERIFY 4.9* 6.9* 8.9*    ABG: No results for input(s): PHART, PCO2ART, PO2ART, HCO3, O2SAT in the last 168 hours.  Liver Function Tests: Recent  Labs  Lab 05/27/18 0434 05/27/18 0810 05/29/18 0558 05/31/18 0500  ALBUMIN QUESTIONABLE RESULTS, RECOMMEND RECOLLECT TO VERIFY 2.4* 2.2* 2.1*   No results for input(s): LIPASE, AMYLASE in the last 168 hours. No results for input(s): AMMONIA in the last 168 hours.  CBC: Recent Labs  Lab 05/27/18 0434 05/27/18 0810 05/29/18 0558 05/31/18 0500  WBC QUESTIONABLE RESULTS, RECOMMEND RECOLLECT TO VERIFY 15.5* 19.5* 17.9*  HGB QUESTIONABLE RESULTS, RECOMMEND RECOLLECT TO VERIFY 9.5* 9.5* 9.4*  HCT QUESTIONABLE RESULTS, RECOMMEND RECOLLECT TO VERIFY 29.8* 30.2* 31.2*  MCV QUESTIONABLE RESULTS, RECOMMEND RECOLLECT TO VERIFY 94.3 96.2 98.1  PLT QUESTIONABLE RESULTS, RECOMMEND RECOLLECT TO VERIFY 377 372 144*    Cardiac Enzymes: No results for input(s): CKTOTAL, CKMB, CKMBINDEX, TROPONINI in the last 168 hours.  BNP (last 3 results) No results for input(s): BNP in the last 8760 hours.  ProBNP (last 3 results) No results for input(s): PROBNP in the last 8760 hours.  Radiological Exams: No results found.  Assessment/Plan Active Problems:   Tracheostomy tube present (HCC)   Stage 5 chronic kidney disease on chronic dialysis (HCC)   Encephalopathy acute   Acute on chronic respiratory failure with hypoxia (HCC)   Healthcare-associated pneumonia   Bleeding at insertion site   1. Acute on chronic respiratory failure with hypoxia continue with capping as ordered.  Patient is awaiting discharge or transfer to [facility] for further evaluation. 2. Acute encephalopathy grossly unchanged 3. Tracheostomy status will remain in place bleeding is resolved 4. Stage V kidney disease on dialysis 5. Healthcare associated pneumonia treated we will continue present management   I have  personally seen and evaluated the patient, evaluated laboratory and imaging results, formulated the assessment and plan and placed orders. The Patient requires high complexity decision making for assessment and support.   Case was discussed on Rounds with the Respiratory Therapy Staff  Allyne Gee, MD [facility] Pulmonary Critical Care Medicine Sleep Medicine

## 2018-06-01 LAB — POTASSIUM: Potassium: 4.6 mmol/L (ref 3.5–5.1)

## 2018-06-01 NOTE — Progress Notes (Signed)
Pulmonary Critical Care Medicine [place]   PULMONARY CRITICAL CARE SERVICE  PROGRESS NOTE  Date of Service: 06/01/2018  VALEN GILLISON  PQD:[id number]  DOB: Nov 12, 1955   DOA: 04/26/2018  Referring Physician: Merton Border, MD  HPI: Jimmy Martin is a 63 y.o. male seen for follow up of Acute on Chronic Respiratory Failure.  Patient is capping doing fairly well right now no distress is noted.  Medications: Reviewed on Rounds  Physical Exam:  Vitals: Temperature 98.3 pulse 94 respiratory 25 blood pressure 149/62 saturations 98%  Ventilator Settings off the ventilator capping  . General: Comfortable at this time . Eyes: Grossly normal lids, irises & conjunctiva . ENT: grossly tongue is normal . Neck: no obvious mass . Cardiovascular: S1 S2 normal no gallop . Respiratory: No rhonchi or rales are noted at this time . Abdomen: soft . Skin: no rash seen on limited exam . Musculoskeletal: not rigid . Psychiatric:unable to assess . Neurologic: no seizure no involuntary movements         Lab Data:   Basic Metabolic Panel: Recent Labs  Lab 05/27/18 0434 05/27/18 0810 05/29/18 0558 05/31/18 0500 06/01/18 1231  NA QUESTIONABLE RESULTS, RECOMMEND RECOLLECT TO VERIFY 135 135 138  --   K QUESTIONABLE RESULTS, RECOMMEND RECOLLECT TO VERIFY 3.5 4.0 5.5* 4.6  CL QUESTIONABLE RESULTS, RECOMMEND RECOLLECT TO VERIFY 97* 97* 98  --   CO2 QUESTIONABLE RESULTS, RECOMMEND RECOLLECT TO VERIFY 25 27 26   --   GLUCOSE QUESTIONABLE RESULTS, RECOMMEND RECOLLECT TO VERIFY 96 108* 73  --   BUN QUESTIONABLE RESULTS, RECOMMEND RECOLLECT TO VERIFY 38* 30* 33*  --   CREATININE QUESTIONABLE RESULTS, RECOMMEND RECOLLECT TO VERIFY 7.07* 7.94* 9.13*  --   CALCIUM QUESTIONABLE RESULTS, RECOMMEND RECOLLECT TO VERIFY 8.6* 8.9 8.9  --   PHOS QUESTIONABLE RESULTS, RECOMMEND RECOLLECT TO VERIFY 4.9* 6.9* 8.9*  --     ABG: No results for input(s): PHART, PCO2ART, PO2ART, HCO3, O2SAT in  the last 168 hours.  Liver Function Tests: Recent Labs  Lab 05/27/18 0434 05/27/18 0810 05/29/18 0558 05/31/18 0500  ALBUMIN QUESTIONABLE RESULTS, RECOMMEND RECOLLECT TO VERIFY 2.4* 2.2* 2.1*   No results for input(s): LIPASE, AMYLASE in the last 168 hours. No results for input(s): AMMONIA in the last 168 hours.  CBC: Recent Labs  Lab 05/27/18 0434 05/27/18 0810 05/29/18 0558 05/31/18 0500  WBC QUESTIONABLE RESULTS, RECOMMEND RECOLLECT TO VERIFY 15.5* 19.5* 17.9*  HGB QUESTIONABLE RESULTS, RECOMMEND RECOLLECT TO VERIFY 9.5* 9.5* 9.4*  HCT QUESTIONABLE RESULTS, RECOMMEND RECOLLECT TO VERIFY 29.8* 30.2* 31.2*  MCV QUESTIONABLE RESULTS, RECOMMEND RECOLLECT TO VERIFY 94.3 96.2 98.1  PLT QUESTIONABLE RESULTS, RECOMMEND RECOLLECT TO VERIFY 377 372 144*    Cardiac Enzymes: No results for input(s): CKTOTAL, CKMB, CKMBINDEX, TROPONINI in the last 168 hours.  BNP (last 3 results) No results for input(s): BNP in the last 8760 hours.  ProBNP (last 3 results) No results for input(s): PROBNP in the last 8760 hours.  Radiological Exams: No results found.  Assessment/Plan Active Problems:   Tracheostomy tube present (HCC)   Stage 5 chronic kidney disease on chronic dialysis (HCC)   Encephalopathy acute   Acute on chronic respiratory failure with hypoxia (HCC)   Healthcare-associated pneumonia   Bleeding at insertion site   1. Acute on chronic respiratory failure with hypoxia we will continue with capping trials patient is on minimal secretions suctioning. 2. Stage V chronic kidney disease end-stage on dialysis will continue with supportive care 3. Acute  encephalopathy resolved 4. Healthcare associated pneumonia treated we will continue with present management 5. Tracheostomy with bleeding site we will continue with present therapy and supportive care bleeding has resolved   I have personally seen and evaluated the patient, evaluated laboratory and imaging results, formulated  the assessment and plan and placed orders. The Patient requires high complexity decision making for assessment and support.  Case was discussed on Rounds with the Respiratory Therapy Staff  Allyne Gee, MD [facility] Pulmonary Critical Care Medicine Sleep Medicine

## 2018-06-02 NOTE — Progress Notes (Signed)
Pulmonary Critical Care Medicine [place]   PULMONARY CRITICAL CARE SERVICE  PROGRESS NOTE  Date of Service: 06/02/2018  Jimmy Martin  BSW:[id number]  DOB: August 25, 1955   DOA: 04/26/2018  Referring Physician: Merton Border, MD  HPI: Jimmy Martin is a 63 y.o. male seen for follow up of Acute on Chronic Respiratory Failure.  Remains No distress at this time.  Afebrile  Medications: Reviewed on Rounds  Physical Exam:  Vitals: Temperature 97.4 pulse 104 respiratory 20 blood pressure 140/76 saturations 94%  Ventilator Settings capping at this time  . General: Comfortable at this time . Eyes: Grossly normal lids, irises & conjunctiva . ENT: grossly tongue is normal . Neck: no obvious mass . Cardiovascular: S1 S2 normal no gallop . Respiratory: No rhonchi or rales are noted at this time . Abdomen: soft . Skin: no rash seen on limited exam . Musculoskeletal: not rigid . Psychiatric:unable to assess . Neurologic: no seizure no involuntary movements         Lab Data:   Basic Metabolic Panel: Recent Labs  Lab 05/27/18 0434 05/27/18 0810 05/29/18 0558 05/31/18 0500 06/01/18 1231  NA QUESTIONABLE RESULTS, RECOMMEND RECOLLECT TO VERIFY 135 135 138  --   K QUESTIONABLE RESULTS, RECOMMEND RECOLLECT TO VERIFY 3.5 4.0 5.5* 4.6  CL QUESTIONABLE RESULTS, RECOMMEND RECOLLECT TO VERIFY 97* 97* 98  --   CO2 QUESTIONABLE RESULTS, RECOMMEND RECOLLECT TO VERIFY 25 27 26   --   GLUCOSE QUESTIONABLE RESULTS, RECOMMEND RECOLLECT TO VERIFY 96 108* 73  --   BUN QUESTIONABLE RESULTS, RECOMMEND RECOLLECT TO VERIFY 38* 30* 33*  --   CREATININE QUESTIONABLE RESULTS, RECOMMEND RECOLLECT TO VERIFY 7.07* 7.94* 9.13*  --   CALCIUM QUESTIONABLE RESULTS, RECOMMEND RECOLLECT TO VERIFY 8.6* 8.9 8.9  --   PHOS QUESTIONABLE RESULTS, RECOMMEND RECOLLECT TO VERIFY 4.9* 6.9* 8.9*  --     ABG: No results for input(s): PHART, PCO2ART, PO2ART, HCO3, O2SAT in the last 168 hours.  Liver  Function Tests: Recent Labs  Lab 05/27/18 0434 05/27/18 0810 05/29/18 0558 05/31/18 0500  ALBUMIN QUESTIONABLE RESULTS, RECOMMEND RECOLLECT TO VERIFY 2.4* 2.2* 2.1*   No results for input(s): LIPASE, AMYLASE in the last 168 hours. No results for input(s): AMMONIA in the last 168 hours.  CBC: Recent Labs  Lab 05/27/18 0434 05/27/18 0810 05/29/18 0558 05/31/18 0500  WBC QUESTIONABLE RESULTS, RECOMMEND RECOLLECT TO VERIFY 15.5* 19.5* 17.9*  HGB QUESTIONABLE RESULTS, RECOMMEND RECOLLECT TO VERIFY 9.5* 9.5* 9.4*  HCT QUESTIONABLE RESULTS, RECOMMEND RECOLLECT TO VERIFY 29.8* 30.2* 31.2*  MCV QUESTIONABLE RESULTS, RECOMMEND RECOLLECT TO VERIFY 94.3 96.2 98.1  PLT QUESTIONABLE RESULTS, RECOMMEND RECOLLECT TO VERIFY 377 372 144*    Cardiac Enzymes: No results for input(s): CKTOTAL, CKMB, CKMBINDEX, TROPONINI in the last 168 hours.  BNP (last 3 results) No results for input(s): BNP in the last 8760 hours.  ProBNP (last 3 results) No results for input(s): PROBNP in the last 8760 hours.  Radiological Exams: No results found.  Assessment/Plan Active Problems:   Tracheostomy tube present (HCC)   Stage 5 chronic kidney disease on chronic dialysis (HCC)   Encephalopathy acute   Acute on chronic respiratory failure with hypoxia (HCC)   Healthcare-associated pneumonia   Bleeding at insertion site   1. Acute on chronic respiratory failure with hypoxia we will continue with capping awaiting discharge plan.  Continue pulmonary toilet supportive care. 2. Stage V chronic kidney disease at baseline we will continue with supportive care he is on dialysis  3. Acute encephalopathy unchanged 4. Healthcare associated pneumonia treated we will continue to follow 5. Tracheostomy will remain in place no active bleeding is noted at this time   I have personally seen and evaluated the patient, evaluated laboratory and imaging results, formulated the assessment and plan and placed orders. The  Patient requires high complexity decision making for assessment and support.  Case was discussed on Rounds with the Respiratory Therapy Staff  Allyne Gee, MD [facility] Pulmonary Critical Care Medicine Sleep Medicine

## 2018-06-03 LAB — CBC
HCT: 30.7 % — ABNORMAL LOW (ref 39.0–52.0)
Hemoglobin: 8.9 g/dL — ABNORMAL LOW (ref 13.0–17.0)
MCH: 28.1 pg (ref 26.0–34.0)
MCHC: 29 g/dL — ABNORMAL LOW (ref 30.0–36.0)
MCV: 96.8 fL (ref 80.0–100.0)
Platelets: 349 10*3/uL (ref 150–400)
RBC: 3.17 MIL/uL — ABNORMAL LOW (ref 4.22–5.81)
RDW: 15.5 % (ref 11.5–15.5)
WBC: 17.3 10*3/uL — ABNORMAL HIGH (ref 4.0–10.5)
nRBC: 0 % (ref 0.0–0.2)

## 2018-06-03 LAB — RENAL FUNCTION PANEL
Albumin: 2.1 g/dL — ABNORMAL LOW (ref 3.5–5.0)
Anion gap: 12 (ref 5–15)
BUN: 32 mg/dL — ABNORMAL HIGH (ref 6–20)
CHLORIDE: 102 mmol/L (ref 98–111)
CO2: 21 mmol/L — ABNORMAL LOW (ref 22–32)
CREATININE: 9.94 mg/dL — AB (ref 0.61–1.24)
Calcium: 8.8 mg/dL — ABNORMAL LOW (ref 8.9–10.3)
GFR calc Af Amer: 6 mL/min — ABNORMAL LOW (ref >60)
GFR calc non Af Amer: 5 mL/min — ABNORMAL LOW (ref >60)
Glucose, Bld: 114 mg/dL — ABNORMAL HIGH (ref 70–99)
Phosphorus: 5.8 mg/dL — ABNORMAL HIGH (ref 2.5–4.6)
Potassium: 4.2 mmol/L (ref 3.5–5.1)
Sodium: 135 mmol/L (ref 135–145)

## 2018-06-03 NOTE — Progress Notes (Signed)
Pulmonary Critical Care Medicine [place]   PULMONARY CRITICAL CARE SERVICE  PROGRESS NOTE  Date of Service: 06/03/2018  Jimmy Martin  RCV:[id number]  DOB: [birth year]   DOA: 04/26/2018  Referring Physician: Merton Border, MD  HPI: Jimmy Martin is a 63 y.o. male seen for follow up of Acute on Chronic Respiratory Failure.  Capping on room air comfortable without distress at this time.  Medications: Reviewed on Rounds  Physical Exam:  Vitals: Temperature 97.2 pulse 97 respiratory 17 blood pressure 109/59 saturations 96%  Ventilator Settings capping off the ventilator right now  . General: Comfortable at this time . Eyes: Grossly normal lids, irises & conjunctiva . ENT: grossly tongue is normal . Neck: no obvious mass . Cardiovascular: S1 S2 normal no gallop . Respiratory: No rhonchi or rales are noted at this time . Abdomen: soft . Skin: no rash seen on limited exam . Musculoskeletal: not rigid . Psychiatric:unable to assess . Neurologic: no seizure no involuntary movements         Lab Data:   Basic Metabolic Panel: Recent Labs  Lab 05/29/18 0558 05/31/18 0500 06/01/18 1231 06/03/18 0730  NA 135 138  --  135  K 4.0 5.5* 4.6 4.2  CL 97* 98  --  102  CO2 27 26  --  21*  GLUCOSE 108* 73  --  114*  BUN 30* 33*  --  32*  CREATININE 7.94* 9.13*  --  9.94*  CALCIUM 8.9 8.9  --  8.8*  PHOS 6.9* 8.9*  --  5.8*    ABG: No results for input(s): PHART, PCO2ART, PO2ART, HCO3, O2SAT in the last 168 hours.  Liver Function Tests: Recent Labs  Lab 05/29/18 0558 05/31/18 0500 06/03/18 0730  ALBUMIN 2.2* 2.1* 2.1*   No results for input(s): LIPASE, AMYLASE in the last 168 hours. No results for input(s): AMMONIA in the last 168 hours.  CBC: Recent Labs  Lab 05/29/18 0558 05/31/18 0500 06/03/18 0730  WBC 19.5* 17.9* 17.3*  HGB 9.5* 9.4* 8.9*  HCT 30.2* 31.2* 30.7*  MCV 96.2 98.1 96.8  PLT 372 144* 349    Cardiac Enzymes: No results  for input(s): CKTOTAL, CKMB, CKMBINDEX, TROPONINI in the last 168 hours.  BNP (last 3 results) No results for input(s): BNP in the last 8760 hours.  ProBNP (last 3 results) No results for input(s): PROBNP in the last 8760 hours.  Radiological Exams: No results found.  Assessment/Plan Active Problems:   Tracheostomy tube present (HCC)   Stage 5 chronic kidney disease on chronic dialysis (HCC)   Encephalopathy acute   Acute on chronic respiratory failure with hypoxia (HCC)   Healthcare-associated pneumonia   Bleeding at insertion site   1. Acute on chronic respiratory failure with hypoxia we will continue with capping as tolerated 2. Stage V kidney disease end-stage on dialysis 3. Encephalopathy resolved 4. Healthcare associated pneumonia treated resolved 5. Bleeding site no active bleeding is noted   I have personally seen and evaluated the patient, evaluated laboratory and imaging results, formulated the assessment and plan and placed orders. The Patient requires high complexity decision making for assessment and support.  Case was discussed on Rounds with the Respiratory Therapy Staff  Allyne Gee, MD [facility] Pulmonary Critical Care Medicine Sleep Medicine

## 2018-06-03 NOTE — Progress Notes (Signed)
[facility]  ROUNDING NOTE   Subjective:  Patient seen during dialysis treatment. Patient sitting up in chair. Complains of some back pain.   Objective:  Vital signs in last 24 hours:  Temperature 97.2 pulse 97 respiration 17 blood pressure 109/59  Physical Exam: General: No acute distress  Head: Normocephalic, atraumatic. Moist oral mucosal membranes  Eyes: Anicteric  Neck: Tracheostomy in place   Lungs:  Scattered rhonchi, normal effort  Heart: S1S2 no rubs  Abdomen:  Soft, nontender, bowel sounds present  Extremities: trace peripheral edema.  Neurologic: Awake, alert, follows commands  Skin: No lesions  Access: R IJ PC    Basic Metabolic Panel: Recent Labs  Lab 05/29/18 0558 05/31/18 0500 06/01/18 1231  NA 135 138  --   K 4.0 5.5* 4.6  CL 97* 98  --   CO2 27 26  --   GLUCOSE 108* 73  --   BUN 30* 33*  --   CREATININE 7.94* 9.13*  --   CALCIUM 8.9 8.9  --   PHOS 6.9* 8.9*  --     Liver Function Tests: Recent Labs  Lab 05/29/18 0558 05/31/18 0500  ALBUMIN 2.2* 2.1*   No results for input(s): LIPASE, AMYLASE in the last 168 hours. No results for input(s): AMMONIA in the last 168 hours.  CBC: Recent Labs  Lab 05/29/18 0558 05/31/18 0500 06/03/18 0730  WBC 19.5* 17.9* 17.3*  HGB 9.5* 9.4* 8.9*  HCT 30.2* 31.2* 30.7*  MCV 96.2 98.1 96.8  PLT 372 144* 349    Cardiac Enzymes: No results for input(s): CKTOTAL, CKMB, CKMBINDEX, TROPONINI in the last 168 hours.  BNP: Invalid input(s): POCBNP  CBG: No results for input(s): GLUCAP in the last 168 hours.  Microbiology: Results for orders placed or performed during the hospital encounter of 04/26/18  Culture, respiratory     Status: None   Collection Time: 05/01/18  3:34 PM  Result Value Ref Range Status   Specimen Description TRACHEAL ASPIRATE  Final   Special Requests NONE  Final   Gram Stain   Final    MODERATE WBC PRESENT,BOTH PMN AND MONONUCLEAR MODERATE GRAM POSITIVE COCCI  IN PAIRS FEW GRAM NEGATIVE RODS MODERATE GRAM POSITIVE RODS RARE SQUAMOUS EPITHELIAL CELLS PRESENT Performed at [facility], [place] [address], [place], [place] [address]    Culture FEW METHICILLIN RESISTANT STAPHYLOCOCCUS AUREUS  Final   Report Status 05/04/2018 FINAL  Final   Organism ID, Bacteria METHICILLIN RESISTANT STAPHYLOCOCCUS AUREUS  Final      Susceptibility   Methicillin resistant staphylococcus aureus - MIC*    CIPROFLOXACIN >=8 RESISTANT Resistant     ERYTHROMYCIN <=0.25 SENSITIVE Sensitive     GENTAMICIN <=0.5 SENSITIVE Sensitive     OXACILLIN >=4 RESISTANT Resistant     TETRACYCLINE <=1 SENSITIVE Sensitive     VANCOMYCIN <=0.5 SENSITIVE Sensitive     TRIMETH/SULFA <=10 SENSITIVE Sensitive     CLINDAMYCIN <=0.25 SENSITIVE Sensitive     RIFAMPIN <=0.5 SENSITIVE Sensitive     Inducible Clindamycin NEGATIVE Sensitive     * FEW METHICILLIN RESISTANT STAPHYLOCOCCUS AUREUS  Culture, respiratory (non-expectorated)     Status: None   Collection Time: 05/04/18  2:27 PM  Result Value Ref Range Status   Specimen Description TRACHEAL ASPIRATE  Final   Special Requests NONE  Final   Gram Stain   Final    FEW WBC PRESENT, PREDOMINANTLY PMN FEW GRAM POSITIVE COCCI    Culture   Final  MODERATE METHICILLIN RESISTANT STAPHYLOCOCCUS AUREUS   Report Status 05/06/2018 FINAL  Final   Organism ID, Bacteria METHICILLIN RESISTANT STAPHYLOCOCCUS AUREUS  Final      Susceptibility   Methicillin resistant staphylococcus aureus - MIC*    CIPROFLOXACIN >=8 RESISTANT Resistant     ERYTHROMYCIN <=0.25 SENSITIVE Sensitive     GENTAMICIN <=0.5 SENSITIVE Sensitive     OXACILLIN >=4 RESISTANT Resistant     TETRACYCLINE <=1 SENSITIVE Sensitive     VANCOMYCIN <=0.5 SENSITIVE Sensitive     TRIMETH/SULFA <=10 SENSITIVE Sensitive     CLINDAMYCIN <=0.25 SENSITIVE Sensitive     RIFAMPIN <=0.5 SENSITIVE Sensitive     Inducible Clindamycin NEGATIVE Sensitive     * MODERATE METHICILLIN RESISTANT  STAPHYLOCOCCUS AUREUS  C difficile quick scan w PCR reflex     Status: None   Collection Time: 05/10/18 10:48 AM  Result Value Ref Range Status   C Diff antigen NEGATIVE NEGATIVE Final   C Diff toxin NEGATIVE NEGATIVE Final   C Diff interpretation No C. difficile detected.  Final    Comment: Performed at [facility], [place] [address], [place], [place] [address]  Culture, respiratory (non-expectorated)     Status: None   Collection Time: 05/18/18  1:25 PM  Result Value Ref Range Status   Specimen Description TRACHEAL ASPIRATE  Final   Special Requests NONE  Final   Gram Stain   Final    ABUNDANT WBC PRESENT, PREDOMINANTLY PMN MODERATE GRAM NEGATIVE RODS RARE GRAM POSITIVE RODS RARE SQUAMOUS EPITHELIAL CELLS PRESENT    Culture   Final    ABUNDANT HAEMOPHILUS PARAINFLUENZAE BETA LACTAMASE NEGATIVE Performed at [facility], [place] [address], [place],  [address]    Report Status 05/21/2018 FINAL  Final    Coagulation Studies: No results for input(s): LABPROT, INR in the last 72 hours.  Urinalysis: No results for input(s): COLORURINE, LABSPEC, PHURINE, GLUCOSEU, HGBUR, BILIRUBINUR, KETONESUR, PROTEINUR, UROBILINOGEN, NITRITE, LEUKOCYTESUR in the last 72 hours.  Invalid input(s): APPERANCEUR    Imaging: No results found.   Medications:       Assessment/ Plan:  63 y.o. 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  1.  ESRD on HD.    Patient tolerating dialysis treatment well.  We plan to complete dialysis treatment today and we will plan for dialysis again on Wednesday.  2.  Acute respiratory failure.    Patient tolerating capped tracheostomy quite well.   Continue to monitor respiratory status.  3.  Secondary hyperparathyroidism.    Phosphorus noted to be high on Friday.  Repeat serum phosphorus today.  4.  Anemia of chronic kidney disease.  Hemoglobin currently 8.9 and likely dilutional.  Continue erythropoietin injections with dialysis treatment.   LOS: 0 Jimmy Martin 1/6/20208:47 AM

## 2018-06-04 NOTE — Progress Notes (Signed)
Pulmonary Critical Care Medicine [place]   PULMONARY CRITICAL CARE SERVICE  PROGRESS NOTE  Date of Service: 06/04/2018  Jimmy Martin  SWN:[id number]  DOB: 03-14-1956   DOA: 04/26/2018  Referring Physician: Merton Border, MD  HPI: Jimmy Martin is a 63 y.o. male seen for follow up of Acute on Chronic Respiratory Failure.  Patient currently is capping has been on 2 L had some desats noted so was placed on the 2 L  Medications: Reviewed on Rounds  Physical Exam:  Vitals: Temperature 97.6 pulse 119 respiratory 16 blood pressure 120/64 saturations 96%  Ventilator Settings off the ventilator capping  . General: Comfortable at this time . Eyes: Grossly normal lids, irises & conjunctiva . ENT: grossly tongue is normal . Neck: no obvious mass . Cardiovascular: S1 S2 normal no gallop . Respiratory: No rhonchi or rales are noted . Abdomen: soft . Skin: no rash seen on limited exam . Musculoskeletal: not rigid . Psychiatric:unable to assess . Neurologic: no seizure no involuntary movements         Lab Data:   Basic Metabolic Panel: Recent Labs  Lab 05/29/18 0558 05/31/18 0500 06/01/18 1231 06/03/18 0730  NA 135 138  --  135  K 4.0 5.5* 4.6 4.2  CL 97* 98  --  102  CO2 27 26  --  21*  GLUCOSE 108* 73  --  114*  BUN 30* 33*  --  32*  CREATININE 7.94* 9.13*  --  9.94*  CALCIUM 8.9 8.9  --  8.8*  PHOS 6.9* 8.9*  --  5.8*    ABG: No results for input(s): PHART, PCO2ART, PO2ART, HCO3, O2SAT in the last 168 hours.  Liver Function Tests: Recent Labs  Lab 05/29/18 0558 05/31/18 0500 06/03/18 0730  ALBUMIN 2.2* 2.1* 2.1*   No results for input(s): LIPASE, AMYLASE in the last 168 hours. No results for input(s): AMMONIA in the last 168 hours.  CBC: Recent Labs  Lab 05/29/18 0558 05/31/18 0500 06/03/18 0730  WBC 19.5* 17.9* 17.3*  HGB 9.5* 9.4* 8.9*  HCT 30.2* 31.2* 30.7*  MCV 96.2 98.1 96.8  PLT 372 144* 349    Cardiac Enzymes: No  results for input(s): CKTOTAL, CKMB, CKMBINDEX, TROPONINI in the last 168 hours.  BNP (last 3 results) No results for input(s): BNP in the last 8760 hours.  ProBNP (last 3 results) No results for input(s): PROBNP in the last 8760 hours.  Radiological Exams: No results found.  Assessment/Plan Active Problems:   Tracheostomy tube present (HCC)   Stage 5 chronic kidney disease on chronic dialysis (HCC)   Encephalopathy acute   Acute on chronic respiratory failure with hypoxia (HCC)   Healthcare-associated pneumonia   Bleeding at insertion site   1. Acute on chronic respiratory failure with hypoxia we will continue with capping trials continue pulmonary toilet supportive care 2. Stage V kidney disease on dialysis continue with supportive care. 3. Acute encephalopathy grossly unchanged 4. Healthcare associated pneumonia treated we will continue to follow 5. Tracheostomy with bleeding no active bleeding is noted at this time   I have personally seen and evaluated the patient, evaluated laboratory and imaging results, formulated the assessment and plan and placed orders. The Patient requires high complexity decision making for assessment and support.  Case was discussed on Rounds with the Respiratory Therapy Staff  Allyne Gee, MD [facility] Pulmonary Critical Care Medicine Sleep Medicine

## 2018-06-05 LAB — RENAL FUNCTION PANEL
Albumin: 2 g/dL — ABNORMAL LOW (ref 3.5–5.0)
Anion gap: 12 (ref 5–15)
BUN: 22 mg/dL — ABNORMAL HIGH (ref 6–20)
CO2: 24 mmol/L (ref 22–32)
Calcium: 8.6 mg/dL — ABNORMAL LOW (ref 8.9–10.3)
Chloride: 100 mmol/L (ref 98–111)
Creatinine, Ser: 8.48 mg/dL — ABNORMAL HIGH (ref 0.61–1.24)
GFR calc Af Amer: 7 mL/min — ABNORMAL LOW (ref >60)
GFR calc non Af Amer: 6 mL/min — ABNORMAL LOW (ref >60)
Glucose, Bld: 79 mg/dL (ref 70–99)
PHOSPHORUS: 5.4 mg/dL — AB (ref 2.5–4.6)
Potassium: 5 mmol/L (ref 3.5–5.1)
Sodium: 136 mmol/L (ref 135–145)

## 2018-06-05 LAB — CBC
HEMATOCRIT: 21.9 % — AB (ref 39.0–52.0)
HEMOGLOBIN: 6.6 g/dL — AB (ref 13.0–17.0)
MCH: 29.3 pg (ref 26.0–34.0)
MCHC: 30.1 g/dL (ref 30.0–36.0)
MCV: 97.3 fL (ref 80.0–100.0)
Platelets: 399 10*3/uL (ref 150–400)
RBC: 2.25 MIL/uL — ABNORMAL LOW (ref 4.22–5.81)
RDW: 15.5 % (ref 11.5–15.5)
WBC: 15.2 10*3/uL — ABNORMAL HIGH (ref 4.0–10.5)
nRBC: 0 % (ref 0.0–0.2)

## 2018-06-05 LAB — PREPARE RBC (CROSSMATCH)

## 2018-06-05 NOTE — Progress Notes (Signed)
Pulmonary Critical Care Medicine [place]   PULMONARY CRITICAL CARE SERVICE  PROGRESS NOTE  Date of Service: 06/05/2018  NAHUM SHERRER  GUY:[id number]  DOB: 01-08-1956   DOA: 04/26/2018  Referring Physician: Merton Border, MD  HPI: Jimmy Martin is a 63 y.o. male seen for follow up of Acute on Chronic Respiratory Failure.  Patient is capping doing fairly well has been on room air basically requiring some oxygen while asleep  Medications: Reviewed on Rounds  Physical Exam:  Vitals: Temperature 96.5 pulse 90 respiratory 16 blood pressure 119/71 saturations 97%  Ventilator Settings capping off the ventilator  . General: Comfortable at this time . Eyes: Grossly normal lids, irises & conjunctiva . ENT: grossly tongue is normal . Neck: no obvious mass . Cardiovascular: S1 S2 normal no gallop . Respiratory: No rhonchi or rales are noted at this time . Abdomen: soft . Skin: no rash seen on limited exam . Musculoskeletal: not rigid . Psychiatric:unable to assess . Neurologic: no seizure no involuntary movements         Lab Data:   Basic Metabolic Panel: Recent Labs  Lab 05/31/18 0500 06/01/18 1231 06/03/18 0730 06/05/18 0714  NA 138  --  135 136  K 5.5* 4.6 4.2 5.0  CL 98  --  102 100  CO2 26  --  21* 24  GLUCOSE 73  --  114* 79  BUN 33*  --  32* 22*  CREATININE 9.13*  --  9.94* 8.48*  CALCIUM 8.9  --  8.8* 8.6*  PHOS 8.9*  --  5.8* 5.4*    ABG: No results for input(s): PHART, PCO2ART, PO2ART, HCO3, O2SAT in the last 168 hours.  Liver Function Tests: Recent Labs  Lab 05/31/18 0500 06/03/18 0730 06/05/18 0714  ALBUMIN 2.1* 2.1* 2.0*   No results for input(s): LIPASE, AMYLASE in the last 168 hours. No results for input(s): AMMONIA in the last 168 hours.  CBC: Recent Labs  Lab 05/31/18 0500 06/03/18 0730 06/05/18 0714  WBC 17.9* 17.3* 15.2*  HGB 9.4* 8.9* 6.6*  HCT 31.2* 30.7* 21.9*  MCV 98.1 96.8 97.3  PLT 144* 349 399     Cardiac Enzymes: No results for input(s): CKTOTAL, CKMB, CKMBINDEX, TROPONINI in the last 168 hours.  BNP (last 3 results) No results for input(s): BNP in the last 8760 hours.  ProBNP (last 3 results) No results for input(s): PROBNP in the last 8760 hours.  Radiological Exams: No results found.  Assessment/Plan Active Problems:   Tracheostomy tube present (HCC)   Stage 5 chronic kidney disease on chronic dialysis (HCC)   Encephalopathy acute   Acute on chronic respiratory failure with hypoxia (HCC)   Healthcare-associated pneumonia   Bleeding at insertion site   1. Acute on chronic respiratory failure with hypoxia patient is capping which will be continued.  We will continue pulmonary toilet supportive care. 2. Stage V chronic kidney disease followed by nephrology for dialysis 3. Encephalopathy improved 4. Of care associated pneumonia treated resolved 5. Bleeding resolved 6. Tracheostomy patient is going to follow with ENT for possible biopsy and treatment he is now willing to have his treatment done here in [place]   I have personally seen and evaluated the patient, evaluated laboratory and imaging results, formulated the assessment and plan and placed orders. The Patient requires high complexity decision making for assessment and support.  Case was discussed on Rounds with the Respiratory Therapy Staff  Allyne Gee, MD [facility] Pulmonary Critical Care  Medicine Sleep Medicine

## 2018-06-05 NOTE — Progress Notes (Signed)
[facility]  ROUNDING NOTE   Subjective:  Patient seen and evaluated during dialysis treatment. Tolerating well. Has been ambulating.   Objective:  Vital signs in last 24 hours:  Temperature 96.5 pulse 90 respirations 16 blood pressure 119/71  Physical Exam: General: No acute distress  Head: Normocephalic, atraumatic. Moist oral mucosal membranes  Eyes: Anicteric  Neck: Tracheostomy in place   Lungs:  Scattered rhonchi, normal effort  Heart: S1S2 no rubs  Abdomen:  Soft, nontender, bowel sounds present  Extremities: trace peripheral edema.  Neurologic: Awake, alert, follows commands  Skin: No lesions  Access: R IJ PC    Basic Metabolic Panel: Recent Labs  Lab 05/31/18 0500 06/01/18 1231 06/03/18 0730  NA 138  --  135  K 5.5* 4.6 4.2  CL 98  --  102  CO2 26  --  21*  GLUCOSE 73  --  114*  BUN 33*  --  32*  CREATININE 9.13*  --  9.94*  CALCIUM 8.9  --  8.8*  PHOS 8.9*  --  5.8*    Liver Function Tests: Recent Labs  Lab 05/31/18 0500 06/03/18 0730  ALBUMIN 2.1* 2.1*   No results for input(s): LIPASE, AMYLASE in the last 168 hours. No results for input(s): AMMONIA in the last 168 hours.  CBC: Recent Labs  Lab 05/31/18 0500 06/03/18 0730 06/05/18 0714  WBC 17.9* 17.3* 15.2*  HGB 9.4* 8.9* 6.6*  HCT 31.2* 30.7* 21.9*  MCV 98.1 96.8 97.3  PLT 144* 349 399    Cardiac Enzymes: No results for input(s): CKTOTAL, CKMB, CKMBINDEX, TROPONINI in the last 168 hours.  BNP: Invalid input(s): POCBNP  CBG: No results for input(s): GLUCAP in the last 168 hours.  Microbiology: Results for orders placed or performed during the hospital encounter of 04/26/18  Culture, respiratory     Status: None   Collection Time: 05/01/18  3:34 PM  Result Value Ref Range Status   Specimen Description TRACHEAL ASPIRATE  Final   Special Requests NONE  Final   Gram Stain   Final    MODERATE WBC PRESENT,BOTH PMN AND MONONUCLEAR MODERATE GRAM POSITIVE COCCI IN  PAIRS FEW GRAM NEGATIVE RODS MODERATE GRAM POSITIVE RODS RARE SQUAMOUS EPITHELIAL CELLS PRESENT Performed at [facility], [place] [address], [place], [place] [address]    Culture FEW METHICILLIN RESISTANT STAPHYLOCOCCUS AUREUS  Final   Report Status 05/04/2018 FINAL  Final   Organism ID, Bacteria METHICILLIN RESISTANT STAPHYLOCOCCUS AUREUS  Final      Susceptibility   Methicillin resistant staphylococcus aureus - MIC*    CIPROFLOXACIN >=8 RESISTANT Resistant     ERYTHROMYCIN <=0.25 SENSITIVE Sensitive     GENTAMICIN <=0.5 SENSITIVE Sensitive     OXACILLIN >=4 RESISTANT Resistant     TETRACYCLINE <=1 SENSITIVE Sensitive     VANCOMYCIN <=0.5 SENSITIVE Sensitive     TRIMETH/SULFA <=10 SENSITIVE Sensitive     CLINDAMYCIN <=0.25 SENSITIVE Sensitive     RIFAMPIN <=0.5 SENSITIVE Sensitive     Inducible Clindamycin NEGATIVE Sensitive     * FEW METHICILLIN RESISTANT STAPHYLOCOCCUS AUREUS  Culture, respiratory (non-expectorated)     Status: None   Collection Time: 05/04/18  2:27 PM  Result Value Ref Range Status   Specimen Description TRACHEAL ASPIRATE  Final   Special Requests NONE  Final   Gram Stain   Final    FEW WBC PRESENT, PREDOMINANTLY PMN FEW GRAM POSITIVE COCCI    Culture   Final    MODERATE METHICILLIN  RESISTANT STAPHYLOCOCCUS AUREUS   Report Status 05/06/2018 FINAL  Final   Organism ID, Bacteria METHICILLIN RESISTANT STAPHYLOCOCCUS AUREUS  Final      Susceptibility   Methicillin resistant staphylococcus aureus - MIC*    CIPROFLOXACIN >=8 RESISTANT Resistant     ERYTHROMYCIN <=0.25 SENSITIVE Sensitive     GENTAMICIN <=0.5 SENSITIVE Sensitive     OXACILLIN >=4 RESISTANT Resistant     TETRACYCLINE <=1 SENSITIVE Sensitive     VANCOMYCIN <=0.5 SENSITIVE Sensitive     TRIMETH/SULFA <=10 SENSITIVE Sensitive     CLINDAMYCIN <=0.25 SENSITIVE Sensitive     RIFAMPIN <=0.5 SENSITIVE Sensitive     Inducible Clindamycin NEGATIVE Sensitive     * MODERATE METHICILLIN RESISTANT  STAPHYLOCOCCUS AUREUS  C difficile quick scan w PCR reflex     Status: None   Collection Time: 05/10/18 10:48 AM  Result Value Ref Range Status   C Diff antigen NEGATIVE NEGATIVE Final   C Diff toxin NEGATIVE NEGATIVE Final   C Diff interpretation No C. difficile detected.  Final    Comment: Performed at [facility], [place] [address] [place], [place] [address]  Culture, respiratory (non-expectorated)     Status: None   Collection Time: 05/18/18  1:25 PM  Result Value Ref Range Status   Specimen Description TRACHEAL ASPIRATE  Final   Special Requests NONE  Final   Gram Stain   Final    ABUNDANT WBC PRESENT, PREDOMINANTLY PMN MODERATE GRAM NEGATIVE RODS RARE GRAM POSITIVE RODS RARE SQUAMOUS EPITHELIAL CELLS PRESENT    Culture   Final    ABUNDANT HAEMOPHILUS PARAINFLUENZAE BETA LACTAMASE NEGATIVE Performed at [facility], [place] [address], [place], [place] [address]    Report Status 05/21/2018 FINAL  Final    Coagulation Studies: No results for input(s): LABPROT, INR in the last 72 hours.  Urinalysis: No results for input(s): COLORURINE, LABSPEC, PHURINE, GLUCOSEU, HGBUR, BILIRUBINUR, KETONESUR, PROTEINUR, UROBILINOGEN, NITRITE, LEUKOCYTESUR in the last 72 hours.  Invalid input(s): APPERANCEUR    Imaging: No results found.   Medications:       Assessment/ Plan:  63 y.o. 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  1.  ESRD on HD.    Patient seen and evaluated during dialysis treatment and tolerating well.  We will continue dialysis on Monday, Wednesday, Friday schedule.  2.  Acute respiratory failure.    Patient still has Tracheostomy and is verbalizing.   Overall doing well from respiratory failure perspective.  3.  Secondary hyperparathyroidism.    Phosphorus down to 5.8 today.  Continue to monitor.  4.  Anemia of chronic kidney disease.  Hemoglobin down to 6.6.  Consider blood transfusion but defer to hospitalist.   LOS: 0 Jimmy Martin 1/8/20209:34 AM

## 2018-06-06 LAB — TYPE AND SCREEN
ABO/RH(D): B POS
Antibody Screen: NEGATIVE
Unit division: 0

## 2018-06-06 LAB — BPAM RBC
Blood Product Expiration Date: 202002012359
ISSUE DATE / TIME: 202001080953
Unit Type and Rh: 7300

## 2018-06-06 NOTE — Progress Notes (Signed)
Pulmonary Critical Care Medicine Jimmy Martin   PULMONARY CRITICAL CARE SERVICE  PROGRESS NOTE  Date of Service: 06/06/2018  Jimmy Martin  UQJ:[id number]  DOB: 07-Dec-1955   DOA: 04/26/2018  Referring Physician: Merton Border, MD  HPI: Jimmy Martin is a 63 y.o. male seen for follow up of Acute on Chronic Respiratory Failure.  Patient is doing relatively well at this time has been capping.  The ENT did see the patient today they cannot come back and scope him tomorrow.  He will be discharged with a tracheostomy eventually  Medications: Reviewed on Rounds  Physical Exam:  Vitals: Temperature 97.4 pulse 98 respiratory rate 21 blood pressure 134/83 saturations 98%  Ventilator Settings currently capping off the ventilator  . General: Comfortable at this time . Eyes: Grossly normal lids, irises & conjunctiva . ENT: grossly tongue is normal . Neck: no obvious mass . Cardiovascular: S1 S2 normal no gallop . Respiratory: No rhonchi or rales are noted at this time . Abdomen: soft . Skin: no rash seen on limited exam . Musculoskeletal: not rigid . Psychiatric:unable to assess . Neurologic: no seizure no involuntary movements         Lab Data:   Basic Metabolic Panel: Recent Labs  Lab 05/31/18 0500 06/01/18 1231 06/03/18 0730 06/05/18 0714  NA 138  --  135 136  K 5.5* 4.6 4.2 5.0  CL 98  --  102 100  CO2 26  --  21* 24  GLUCOSE 73  --  114* 79  BUN 33*  --  32* 22*  CREATININE 9.13*  --  9.94* 8.48*  CALCIUM 8.9  --  8.8* 8.6*  PHOS 8.9*  --  5.8* 5.4*    ABG: No results for input(s): PHART, PCO2ART, PO2ART, HCO3, O2SAT in the last 168 hours.  Liver Function Tests: Recent Labs  Lab 05/31/18 0500 06/03/18 0730 06/05/18 0714  ALBUMIN 2.1* 2.1* 2.0*   No results for input(s): LIPASE, AMYLASE in the last 168 hours. No results for input(s): AMMONIA in the last 168 hours.  CBC: Recent Labs  Lab 05/31/18 0500 06/03/18 0730 06/05/18 0714  WBC  17.9* 17.3* 15.2*  HGB 9.4* 8.9* 6.6*  HCT 31.2* 30.7* 21.9*  MCV 98.1 96.8 97.3  PLT 144* 349 399    Cardiac Enzymes: No results for input(s): CKTOTAL, CKMB, CKMBINDEX, TROPONINI in the last 168 hours.  BNP (last 3 results) No results for input(s): BNP in the last 8760 hours.  ProBNP (last 3 results) No results for input(s): PROBNP in the last 8760 hours.  Radiological Exams: No results found.  Assessment/Plan Active Problems:   Tracheostomy tube present (HCC)   Stage 5 chronic kidney disease on chronic dialysis (HCC)   Encephalopathy acute   Acute on chronic respiratory failure with hypoxia (HCC)   Healthcare-associated pneumonia   Bleeding at insertion site   1. Acute on chronic respiratory failure with hypoxia we will continue with the capping trials as ordered.  Continue secretion management pulmonary toilet. 2. Stage V kidney disease on dialysis we will continue present therapy 3. Acute encephalopathy at baseline 4. Healthcare associated pneumonia treated resolved 5. Bleeding at tracheostomy resolved and the tracheostomy will be evaluated tomorrow   I have personally seen and evaluated the patient, evaluated laboratory and imaging results, formulated the assessment and plan and placed orders. The Patient requires high complexity decision making for assessment and support.  Case was discussed on Rounds with the Respiratory Therapy Staff  Vernell Townley A  Humphrey Rolls, MD [facility] Pulmonary Critical Care Medicine Sleep Medicine

## 2018-06-07 LAB — CBC
HCT: 31.6 % — ABNORMAL LOW (ref 39.0–52.0)
Hemoglobin: 9.5 g/dL — ABNORMAL LOW (ref 13.0–17.0)
MCH: 28.4 pg (ref 26.0–34.0)
MCHC: 30.1 g/dL (ref 30.0–36.0)
MCV: 94.6 fL (ref 80.0–100.0)
NRBC: 0 % (ref 0.0–0.2)
Platelets: 351 10*3/uL (ref 150–400)
RBC: 3.34 MIL/uL — ABNORMAL LOW (ref 4.22–5.81)
RDW: 16.3 % — ABNORMAL HIGH (ref 11.5–15.5)
WBC: 15.4 10*3/uL — ABNORMAL HIGH (ref 4.0–10.5)

## 2018-06-07 LAB — RENAL FUNCTION PANEL
Albumin: 2.2 g/dL — ABNORMAL LOW (ref 3.5–5.0)
Anion gap: 13 (ref 5–15)
BUN: 28 mg/dL — AB (ref 6–20)
CALCIUM: 8.5 mg/dL — AB (ref 8.9–10.3)
CO2: 21 mmol/L — ABNORMAL LOW (ref 22–32)
CREATININE: 8.2 mg/dL — AB (ref 0.61–1.24)
Chloride: 102 mmol/L (ref 98–111)
GFR calc Af Amer: 7 mL/min — ABNORMAL LOW (ref >60)
GFR calc non Af Amer: 6 mL/min — ABNORMAL LOW (ref >60)
Glucose, Bld: 73 mg/dL (ref 70–99)
Phosphorus: 6.3 mg/dL — ABNORMAL HIGH (ref 2.5–4.6)
Potassium: 4.6 mmol/L (ref 3.5–5.1)
SODIUM: 136 mmol/L (ref 135–145)

## 2018-06-07 NOTE — Progress Notes (Signed)
[facility]  ROUNDING NOTE   Subjective:  Patient seen and evaluated during hemodialysis. Tolerating well. Laying in bed comfortably.   Objective:  Vital signs in last 24 hours:  Temperature 98.7 pulse 97 respirations 22 blood pressure 111/62 Physical Exam: General: No acute distress  Head: Normocephalic, atraumatic. Moist oral mucosal membranes  Eyes: Anicteric  Neck: Tracheostomy in place   Lungs:  Scattered rhonchi, normal effort  Heart: S1S2 no rubs  Abdomen:  Soft, nontender, bowel sounds present, PD catheter in place  Extremities: trace peripheral edema.  Neurologic: Awake, alert, follows commands  Skin: No lesions  Access: R IJ PC    Basic Metabolic Panel: Recent Labs  Lab 06/01/18 1231 06/03/18 0730 06/05/18 0714  NA  --  135 136  K 4.6 4.2 5.0  CL  --  102 100  CO2  --  21* 24  GLUCOSE  --  114* 79  BUN  --  32* 22*  CREATININE  --  9.94* 8.48*  CALCIUM  --  8.8* 8.6*  PHOS  --  5.8* 5.4*    Liver Function Tests: Recent Labs  Lab 06/03/18 0730 06/05/18 0714  ALBUMIN 2.1* 2.0*   No results for input(s): LIPASE, AMYLASE in the last 168 hours. No results for input(s): AMMONIA in the last 168 hours.  CBC: Recent Labs  Lab 06/03/18 0730 06/05/18 0714  WBC 17.3* 15.2*  HGB 8.9* 6.6*  HCT 30.7* 21.9*  MCV 96.8 97.3  PLT 349 399    Cardiac Enzymes: No results for input(s): CKTOTAL, CKMB, CKMBINDEX, TROPONINI in the last 168 hours.  BNP: Invalid input(s): POCBNP  CBG: No results for input(s): GLUCAP in the last 168 hours.  Microbiology: Results for orders placed or performed during the hospital encounter of 04/26/18  Culture, respiratory     Status: None   Collection Time: 05/01/18  3:34 PM  Result Value Ref Range Status   Specimen Description TRACHEAL ASPIRATE  Final   Special Requests NONE  Final   Gram Stain   Final    MODERATE WBC PRESENT,BOTH PMN AND MONONUCLEAR MODERATE GRAM POSITIVE COCCI IN PAIRS FEW GRAM  NEGATIVE RODS MODERATE GRAM POSITIVE RODS RARE SQUAMOUS EPITHELIAL CELLS PRESENT Performed at [facility], [place] [address] [place], [place] [address]    Culture FEW METHICILLIN RESISTANT STAPHYLOCOCCUS AUREUS  Final   Report Status 05/04/2018 FINAL  Final   Organism ID, Bacteria METHICILLIN RESISTANT STAPHYLOCOCCUS AUREUS  Final      Susceptibility   Methicillin resistant staphylococcus aureus - MIC*    CIPROFLOXACIN >=8 RESISTANT Resistant     ERYTHROMYCIN <=0.25 SENSITIVE Sensitive     GENTAMICIN <=0.5 SENSITIVE Sensitive     OXACILLIN >=4 RESISTANT Resistant     TETRACYCLINE <=1 SENSITIVE Sensitive     VANCOMYCIN <=0.5 SENSITIVE Sensitive     TRIMETH/SULFA <=10 SENSITIVE Sensitive     CLINDAMYCIN <=0.25 SENSITIVE Sensitive     RIFAMPIN <=0.5 SENSITIVE Sensitive     Inducible Clindamycin NEGATIVE Sensitive     * FEW METHICILLIN RESISTANT STAPHYLOCOCCUS AUREUS  Culture, respiratory (non-expectorated)     Status: None   Collection Time: 05/04/18  2:27 PM  Result Value Ref Range Status   Specimen Description TRACHEAL ASPIRATE  Final   Special Requests NONE  Final   Gram Stain   Final    FEW WBC PRESENT, PREDOMINANTLY PMN FEW GRAM POSITIVE COCCI    Culture   Final    MODERATE METHICILLIN RESISTANT STAPHYLOCOCCUS AUREUS  Report Status 05/06/2018 FINAL  Final   Organism ID, Bacteria METHICILLIN RESISTANT STAPHYLOCOCCUS AUREUS  Final      Susceptibility   Methicillin resistant staphylococcus aureus - MIC*    CIPROFLOXACIN >=8 RESISTANT Resistant     ERYTHROMYCIN <=0.25 SENSITIVE Sensitive     GENTAMICIN <=0.5 SENSITIVE Sensitive     OXACILLIN >=4 RESISTANT Resistant     TETRACYCLINE <=1 SENSITIVE Sensitive     VANCOMYCIN <=0.5 SENSITIVE Sensitive     TRIMETH/SULFA <=10 SENSITIVE Sensitive     CLINDAMYCIN <=0.25 SENSITIVE Sensitive     RIFAMPIN <=0.5 SENSITIVE Sensitive     Inducible Clindamycin NEGATIVE Sensitive     * MODERATE METHICILLIN RESISTANT STAPHYLOCOCCUS  AUREUS  C difficile quick scan w PCR reflex     Status: None   Collection Time: 05/10/18 10:48 AM  Result Value Ref Range Status   C Diff antigen NEGATIVE NEGATIVE Final   C Diff toxin NEGATIVE NEGATIVE Final   C Diff interpretation No C. difficile detected.  Final    Comment: Performed at [facility], [place] [address] [place], [place] [address]  Culture, respiratory (non-expectorated)     Status: None   Collection Time: 05/18/18  1:25 PM  Result Value Ref Range Status   Specimen Description TRACHEAL ASPIRATE  Final   Special Requests NONE  Final   Gram Stain   Final    ABUNDANT WBC PRESENT, PREDOMINANTLY PMN MODERATE GRAM NEGATIVE RODS RARE GRAM POSITIVE RODS RARE SQUAMOUS EPITHELIAL CELLS PRESENT    Culture   Final    ABUNDANT HAEMOPHILUS PARAINFLUENZAE BETA LACTAMASE NEGATIVE Performed at [facility], [place] [address] [place], [place] [address]    Report Status 05/21/2018 FINAL  Final    Coagulation Studies: No results for input(s): LABPROT, INR in the last 72 hours.  Urinalysis: No results for input(s): COLORURINE, LABSPEC, PHURINE, GLUCOSEU, HGBUR, BILIRUBINUR, KETONESUR, PROTEINUR, UROBILINOGEN, NITRITE, LEUKOCYTESUR in the last 72 hours.  Invalid input(s): APPERANCEUR    Imaging: No results found.   Medications:       Assessment/ Plan:  63 y.o. 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  1.  ESRD on HD.    Patient seen and evaluated during hemodialysis.  Outpatient planning for hemodialysis currently ongoing.  2.  Acute respiratory failure.    Patient tolerating capped tracheostomy quite well.  Breathing comfortably.  3.  Secondary  hyperparathyroidism.    Phosphorus down to 5.4 and acceptable.  Continue to monitor.  4.  Anemia of chronic kidney disease.  Hemoglobin was 6.6 at last check.  Patient underwent blood transfusion.  Recommend rechecking CBC.   LOS: 0 Lia Vigilante 1/10/20208:29 AM

## 2018-06-07 NOTE — Progress Notes (Signed)
Pulmonary Critical Care Medicine [place]   PULMONARY CRITICAL CARE SERVICE  PROGRESS NOTE  Date of Service: 06/07/2018  Jimmy Martin  MPN:[id number]  DOB: 04-Jul-1955   DOA: 04/26/2018  Referring Physician: Merton Border, MD  HPI: Jimmy Martin is a 63 y.o. male seen for follow up of Acute on Chronic Respiratory Failure.  At this time patient is capping doing well.  He was being dialyzed.  Supposed to be seen by ENT today for evaluation of his upper airway.  Medications: Reviewed on Rounds  Physical Exam:  Vitals: Temperature 98.7 pulse 71 respiratory rate 22 blood pressure 111/62 saturations 97%  Ventilator Settings off the ventilator capping  . General: Comfortable at this time . Eyes: Grossly normal lids, irises & conjunctiva . ENT: grossly tongue is normal . Neck: no obvious mass . Cardiovascular: S1 S2 normal no gallop . Respiratory: No rhonchi or rales are noted at this time . Abdomen: soft . Skin: no rash seen on limited exam . Musculoskeletal: not rigid . Psychiatric:unable to assess . Neurologic: no seizure no involuntary movements         Lab Data:   Basic Metabolic Panel: Recent Labs  Lab 06/01/18 1231 06/03/18 0730 06/05/18 0714 06/07/18 0745  NA  --  135 136 136  K 4.6 4.2 5.0 4.6  CL  --  102 100 102  CO2  --  21* 24 21*  GLUCOSE  --  114* 79 73  BUN  --  32* 22* 28*  CREATININE  --  9.94* 8.48* 8.20*  CALCIUM  --  8.8* 8.6* 8.5*  PHOS  --  5.8* 5.4* 6.3*    ABG: No results for input(s): PHART, PCO2ART, PO2ART, HCO3, O2SAT in the last 168 hours.  Liver Function Tests: Recent Labs  Lab 06/03/18 0730 06/05/18 0714 06/07/18 0745  ALBUMIN 2.1* 2.0* 2.2*   No results for input(s): LIPASE, AMYLASE in the last 168 hours. No results for input(s): AMMONIA in the last 168 hours.  CBC: Recent Labs  Lab 06/03/18 0730 06/05/18 0714 06/07/18 0745  WBC 17.3* 15.2* 15.4*  HGB 8.9* 6.6* 9.5*  HCT 30.7* 21.9* 31.6*  MCV  96.8 97.3 94.6  PLT 349 399 351    Cardiac Enzymes: No results for input(s): CKTOTAL, CKMB, CKMBINDEX, TROPONINI in the last 168 hours.  BNP (last 3 results) No results for input(s): BNP in the last 8760 hours.  ProBNP (last 3 results) No results for input(s): PROBNP in the last 8760 hours.  Radiological Exams: No results found.  Assessment/Plan Active Problems:   Tracheostomy tube present (HCC)   Stage 5 chronic kidney disease on chronic dialysis (HCC)   Encephalopathy acute   Acute on chronic respiratory failure with hypoxia (HCC)   Healthcare-associated pneumonia   Bleeding at insertion site   1. Acute on chronic respiratory failure with hypoxia we will continue with the capping trials.  Continue pulmonary toilet secretion management.  Patient is supposed to be seen by ENT 2. Acute encephalopathy resolved 3. Stage V kidney disease being seen by nephrology for dialysis 4. Associated pneumonia treated 5. Resolved 6. Bleeding further bleeding has been noted from his tracheostomy   I have personally seen and evaluated the patient, evaluated laboratory and imaging results, formulated the assessment and plan and placed orders. The Patient requires high complexity decision making for assessment and support.  Case was discussed on Rounds with the Respiratory Therapy Staff  Allyne Gee, MD [facility] Pulmonary Critical Care Medicine  Sleep Medicine

## 2018-06-08 NOTE — Progress Notes (Signed)
Pulmonary Critical Care Medicine [place]   PULMONARY CRITICAL CARE SERVICE  PROGRESS NOTE  Date of Service: 06/08/2018  Jimmy Martin  TGG:[id number]  DOB: Nov 13, 1955   DOA: 04/26/2018  Referring Physician: Merton Border, MD  HPI: Jimmy Martin is a 63 y.o. male seen for follow up of Acute on Chronic Respiratory Failure.  Patient is capping right now is on room air without distress  Medications: Reviewed on Rounds  Physical Exam:  Vitals: Temperature 97.4 pulse 113 respiratory rate 16 blood pressure 112/63 saturations 96%  Ventilator Settings patient is capping off the ventilator  . General: Comfortable at this time . Eyes: Grossly normal lids, irises & conjunctiva . ENT: grossly tongue is normal . Neck: no obvious mass . Cardiovascular: S1 S2 normal no gallop . Respiratory: Coarse breath sounds few rhonchi . Abdomen: soft . Skin: no rash seen on limited exam . Musculoskeletal: not rigid . Psychiatric:unable to assess . Neurologic: no seizure no involuntary movements         Lab Data:   Basic Metabolic Panel: Recent Labs  Lab 06/03/18 0730 06/05/18 0714 06/07/18 0745  NA 135 136 136  K 4.2 5.0 4.6  CL 102 100 102  CO2 21* 24 21*  GLUCOSE 114* 79 73  BUN 32* 22* 28*  CREATININE 9.94* 8.48* 8.20*  CALCIUM 8.8* 8.6* 8.5*  PHOS 5.8* 5.4* 6.3*    ABG: No results for input(s): PHART, PCO2ART, PO2ART, HCO3, O2SAT in the last 168 hours.  Liver Function Tests: Recent Labs  Lab 06/03/18 0730 06/05/18 0714 06/07/18 0745  ALBUMIN 2.1* 2.0* 2.2*   No results for input(s): LIPASE, AMYLASE in the last 168 hours. No results for input(s): AMMONIA in the last 168 hours.  CBC: Recent Labs  Lab 06/03/18 0730 06/05/18 0714 06/07/18 0745  WBC 17.3* 15.2* 15.4*  HGB 8.9* 6.6* 9.5*  HCT 30.7* 21.9* 31.6*  MCV 96.8 97.3 94.6  PLT 349 399 351    Cardiac Enzymes: No results for input(s): CKTOTAL, CKMB, CKMBINDEX, TROPONINI in the last 168  hours.  BNP (last 3 results) No results for input(s): BNP in the last 8760 hours.  ProBNP (last 3 results) No results for input(s): PROBNP in the last 8760 hours.  Radiological Exams: No results found.  Assessment/Plan Active Problems:   Tracheostomy tube present (HCC)   Stage 5 chronic kidney disease on chronic dialysis (HCC)   Encephalopathy acute   Acute on chronic respiratory failure with hypoxia (HCC)   Healthcare-associated pneumonia   Bleeding at insertion site   1. Acute on chronic respiratory failure with hypoxia we will continue with capping titrate oxygen right now is on room air 2. Encephalopathy resolved 3. Stage V kidney disease on dialysis 4. Healthcare associated pneumonia treated 5. Bleeding resolved   I have personally seen and evaluated the patient, evaluated laboratory and imaging results, formulated the assessment and plan and placed orders. The Patient requires high complexity decision making for assessment and support.  Case was discussed on Rounds with the Respiratory Therapy Staff  Allyne Gee, MD [facility] Pulmonary Critical Care Medicine Sleep Medicine

## 2018-06-09 NOTE — Progress Notes (Signed)
Pulmonary Critical Care Medicine [place]   PULMONARY CRITICAL CARE SERVICE  PROGRESS NOTE  Date of Service: 06/09/2018  Jimmy Martin  YSA:[id number]  DOB: 1955-08-21   DOA: 04/26/2018  Referring Physician: Merton Border, MD  HPI: Jimmy Martin is a 63 y.o. male seen for follow up of Acute on Chronic Respiratory Failure.  Currently patient is on capping trials he is at baseline  Medications: Reviewed on Rounds  Physical Exam:  Vitals: Temperature 96.1 pulse 81 respiratory 16 blood pressure is 101/56 saturations 99%  Ventilator Settings capping off the ventilator  . General: Comfortable at this time . Eyes: Grossly normal lids, irises & conjunctiva . ENT: grossly tongue is normal . Neck: no obvious mass . Cardiovascular: S1 S2 normal no gallop . Respiratory: No rhonchi or rales are noted at this time . Abdomen: soft . Skin: no rash seen on limited exam . Musculoskeletal: not rigid . Psychiatric:unable to assess . Neurologic: no seizure no involuntary movements         Lab Data:   Basic Metabolic Panel: Recent Labs  Lab 06/03/18 0730 06/05/18 0714 06/07/18 0745  NA 135 136 136  K 4.2 5.0 4.6  CL 102 100 102  CO2 21* 24 21*  GLUCOSE 114* 79 73  BUN 32* 22* 28*  CREATININE 9.94* 8.48* 8.20*  CALCIUM 8.8* 8.6* 8.5*  PHOS 5.8* 5.4* 6.3*    ABG: No results for input(s): PHART, PCO2ART, PO2ART, HCO3, O2SAT in the last 168 hours.  Liver Function Tests: Recent Labs  Lab 06/03/18 0730 06/05/18 0714 06/07/18 0745  ALBUMIN 2.1* 2.0* 2.2*   No results for input(s): LIPASE, AMYLASE in the last 168 hours. No results for input(s): AMMONIA in the last 168 hours.  CBC: Recent Labs  Lab 06/03/18 0730 06/05/18 0714 06/07/18 0745  WBC 17.3* 15.2* 15.4*  HGB 8.9* 6.6* 9.5*  HCT 30.7* 21.9* 31.6*  MCV 96.8 97.3 94.6  PLT 349 399 351    Cardiac Enzymes: No results for input(s): CKTOTAL, CKMB, CKMBINDEX, TROPONINI in the last 168  hours.  BNP (last 3 results) No results for input(s): BNP in the last 8760 hours.  ProBNP (last 3 results) No results for input(s): PROBNP in the last 8760 hours.  Radiological Exams: No results found.  Assessment/Plan Active Problems:   Tracheostomy tube present (HCC)   Stage 5 chronic kidney disease on chronic dialysis (HCC)   Encephalopathy acute   Acute on chronic respiratory failure with hypoxia (HCC)   Healthcare-associated pneumonia   Bleeding at insertion site   1. Acute on chronic respiratory failure with hypoxia we will continue with capping patient is at baseline currently continue pulmonary toilet secretion management 2. Stage V kidney disease end-stage on dialysis which will be continued 3. Encephalopathy grossly unchanged 4. Healthcare associated pneumonia treated 5. Bleeding from tracheostomy resolved   I have personally seen and evaluated the patient, evaluated laboratory and imaging results, formulated the assessment and plan and placed orders. The Patient requires high complexity decision making for assessment and support.  Case was discussed on Rounds with the Respiratory Therapy Staff  Allyne Gee, MD [facility] Pulmonary Critical Care Medicine Sleep Medicine

## 2018-06-10 LAB — CBC
HEMATOCRIT: 29.7 % — AB (ref 39.0–52.0)
Hemoglobin: 8.8 g/dL — ABNORMAL LOW (ref 13.0–17.0)
MCH: 27.7 pg (ref 26.0–34.0)
MCHC: 29.6 g/dL — ABNORMAL LOW (ref 30.0–36.0)
MCV: 93.4 fL (ref 80.0–100.0)
Platelets: 340 10*3/uL (ref 150–400)
RBC: 3.18 MIL/uL — ABNORMAL LOW (ref 4.22–5.81)
RDW: 15.6 % — ABNORMAL HIGH (ref 11.5–15.5)
WBC: 15.2 10*3/uL — ABNORMAL HIGH (ref 4.0–10.5)
nRBC: 0 % (ref 0.0–0.2)

## 2018-06-10 LAB — RENAL FUNCTION PANEL
ANION GAP: 11 (ref 5–15)
Albumin: 2 g/dL — ABNORMAL LOW (ref 3.5–5.0)
BUN: 25 mg/dL — ABNORMAL HIGH (ref 6–20)
CO2: 22 mmol/L (ref 22–32)
Calcium: 8.3 mg/dL — ABNORMAL LOW (ref 8.9–10.3)
Chloride: 102 mmol/L (ref 98–111)
Creatinine, Ser: 8.4 mg/dL — ABNORMAL HIGH (ref 0.61–1.24)
GFR calc Af Amer: 7 mL/min — ABNORMAL LOW (ref >60)
GFR calc non Af Amer: 6 mL/min — ABNORMAL LOW (ref >60)
Glucose, Bld: 84 mg/dL (ref 70–99)
Phosphorus: 5.5 mg/dL — ABNORMAL HIGH (ref 2.5–4.6)
Potassium: 4.4 mmol/L (ref 3.5–5.1)
Sodium: 135 mmol/L (ref 135–145)

## 2018-06-10 NOTE — Progress Notes (Signed)
[facility]  ROUNDING NOTE   Subjective:  Patient seen at bedside. Due for dialysis today. Care management still working towards disposition.  Objective:  Vital signs in last 24 hours:  Temperature 98.1 pulse 90 respirations 18 blood pressure 105/60  Physical Exam: General: No acute distress  Head: Normocephalic, atraumatic. Moist oral mucosal membranes  Eyes: Anicteric  Neck: Tracheostomy in place   Lungs:  Scattered rhonchi, normal effort  Heart: S1S2 no rubs  Abdomen:  Soft, nontender, bowel sounds present, PD catheter in place  Extremities: trace peripheral edema.  Neurologic: Awake, alert, follows commands  Skin: No lesions  Access: R IJ PC    Basic Metabolic Panel: Recent Labs  Lab 06/05/18 0714 06/07/18 0745 06/10/18 0616  NA 136 136 135  K 5.0 4.6 4.4  CL 100 102 102  CO2 24 21* 22  GLUCOSE 79 73 84  BUN 22* 28* 25*  CREATININE 8.48* 8.20* 8.40*  CALCIUM 8.6* 8.5* 8.3*  PHOS 5.4* 6.3* 5.5*    Liver Function Tests: Recent Labs  Lab 06/05/18 0714 06/07/18 0745 06/10/18 0616  ALBUMIN 2.0* 2.2* 2.0*   No results for input(s): LIPASE, AMYLASE in the last 168 hours. No results for input(s): AMMONIA in the last 168 hours.  CBC: Recent Labs  Lab 06/05/18 0714 06/07/18 0745 06/10/18 0616  WBC 15.2* 15.4* 15.2*  HGB 6.6* 9.5* 8.8*  HCT 21.9* 31.6* 29.7*  MCV 97.3 94.6 93.4  PLT 399 351 340    Cardiac Enzymes: No results for input(s): CKTOTAL, CKMB, CKMBINDEX, TROPONINI in the last 168 hours.  BNP: Invalid input(s): POCBNP  CBG: No results for input(s): GLUCAP in the last 168 hours.  Microbiology: Results for orders placed or performed during the hospital encounter of 04/26/18  Culture, respiratory     Status: None   Collection Time: 05/01/18  3:34 PM  Result Value Ref Range Status   Specimen Description TRACHEAL ASPIRATE  Final   Special Requests NONE  Final   Gram Stain   Final    MODERATE WBC PRESENT,BOTH PMN AND  MONONUCLEAR MODERATE GRAM POSITIVE COCCI IN PAIRS FEW GRAM NEGATIVE RODS MODERATE GRAM POSITIVE RODS RARE SQUAMOUS EPITHELIAL CELLS PRESENT Performed at [facility], [place] [address] [place],  [address]    Culture FEW METHICILLIN RESISTANT STAPHYLOCOCCUS AUREUS  Final   Report Status 05/04/2018 FINAL  Final   Organism ID, Bacteria METHICILLIN RESISTANT STAPHYLOCOCCUS AUREUS  Final      Susceptibility   Methicillin resistant staphylococcus aureus - MIC*    CIPROFLOXACIN >=8 RESISTANT Resistant     ERYTHROMYCIN <=0.25 SENSITIVE Sensitive     GENTAMICIN <=0.5 SENSITIVE Sensitive     OXACILLIN >=4 RESISTANT Resistant     TETRACYCLINE <=1 SENSITIVE Sensitive     VANCOMYCIN <=0.5 SENSITIVE Sensitive     TRIMETH/SULFA <=10 SENSITIVE Sensitive     CLINDAMYCIN <=0.25 SENSITIVE Sensitive     RIFAMPIN <=0.5 SENSITIVE Sensitive     Inducible Clindamycin NEGATIVE Sensitive     * FEW METHICILLIN RESISTANT STAPHYLOCOCCUS AUREUS  Culture, respiratory (non-expectorated)     Status: None   Collection Time: 05/04/18  2:27 PM  Result Value Ref Range Status   Specimen Description TRACHEAL ASPIRATE  Final   Special Requests NONE  Final   Gram Stain   Final    FEW WBC PRESENT, PREDOMINANTLY PMN FEW GRAM POSITIVE COCCI    Culture   Final    MODERATE METHICILLIN RESISTANT STAPHYLOCOCCUS AUREUS   Report Status 05/06/2018  FINAL  Final   Organism ID, Bacteria METHICILLIN RESISTANT STAPHYLOCOCCUS AUREUS  Final      Susceptibility   Methicillin resistant staphylococcus aureus - MIC*    CIPROFLOXACIN >=8 RESISTANT Resistant     ERYTHROMYCIN <=0.25 SENSITIVE Sensitive     GENTAMICIN <=0.5 SENSITIVE Sensitive     OXACILLIN >=4 RESISTANT Resistant     TETRACYCLINE <=1 SENSITIVE Sensitive     VANCOMYCIN <=0.5 SENSITIVE Sensitive     TRIMETH/SULFA <=10 SENSITIVE Sensitive     CLINDAMYCIN <=0.25 SENSITIVE Sensitive     RIFAMPIN <=0.5 SENSITIVE Sensitive     Inducible Clindamycin NEGATIVE  Sensitive     * MODERATE METHICILLIN RESISTANT STAPHYLOCOCCUS AUREUS  C difficile quick scan w PCR reflex     Status: None   Collection Time: 05/10/18 10:48 AM  Result Value Ref Range Status   C Diff antigen NEGATIVE NEGATIVE Final   C Diff toxin NEGATIVE NEGATIVE Final   C Diff interpretation No C. difficile detected.  Final    Comment: Performed at [facility], [place] [address] [place], [place] [address]  Culture, respiratory (non-expectorated)     Status: None   Collection Time: 05/18/18  1:25 PM  Result Value Ref Range Status   Specimen Description TRACHEAL ASPIRATE  Final   Special Requests NONE  Final   Gram Stain   Final    ABUNDANT WBC PRESENT, PREDOMINANTLY PMN MODERATE GRAM NEGATIVE RODS RARE GRAM POSITIVE RODS RARE SQUAMOUS EPITHELIAL CELLS PRESENT    Culture   Final    ABUNDANT HAEMOPHILUS PARAINFLUENZAE BETA LACTAMASE NEGATIVE Performed at [facility], [place] [address], [place], [place] [address]    Report Status 05/21/2018 FINAL  Final    Coagulation Studies: No results for input(s): LABPROT, INR in the last 72 hours.  Urinalysis: No results for input(s): COLORURINE, LABSPEC, PHURINE, GLUCOSEU, HGBUR, BILIRUBINUR, KETONESUR, PROTEINUR, UROBILINOGEN, NITRITE, LEUKOCYTESUR in the last 72 hours.  Invalid input(s): APPERANCEUR    Imaging: No results found.   Medications:       Assessment/ Plan:  63 y.o. 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  1.  ESRD on HD.    Patient due for dialysis today.  Orders have been prepared.  2.  Acute respiratory failure.    Patient continues to do well from a respiratory perspective.  His tracheostomy remains  capped.  He is able to verbalize.  3.  Secondary hyperparathyroidism.    Phosphorus 5.5 this a.m. and at target.  Continue to monitor.  4.  Anemia of chronic kidney disease.  Hemoglobin down a bit most likely hemodilutional.  Follow-up CBC on Wednesday.   LOS: 0 Anicia Leuthold 1/13/20208:58 AM

## 2018-06-10 NOTE — Progress Notes (Signed)
Pulmonary Critical Care Medicine [place]   PULMONARY CRITICAL CARE SERVICE  PROGRESS NOTE  Date of Service: 06/10/2018  ESCO JOSLYN  [id number]:[id number]  DOB: Aug 31, 1955   DOA: 04/26/2018  Referring Physician: Merton Border, MD  HPI: Jimmy Martin is a 63 y.o. male seen for follow up of Acute on Chronic Respiratory Failure.  Patient right now is capping seems to be doing fine he has had no secretions whatsoever  Medications: Reviewed on Rounds  Physical Exam:  Vitals: Temperature 98.1 pulse 90 respiratory 18 blood pressure 105/60 saturations 98%  Ventilator Settings currently is capping off the ventilator  . General: Comfortable at this time . Eyes: Grossly normal lids, irises & conjunctiva . ENT: grossly tongue is normal . Neck: no obvious mass . Cardiovascular: S1 S2 normal no gallop . Respiratory: No rhonchi or rales are noted at this time . Abdomen: soft . Skin: no rash seen on limited exam . Musculoskeletal: not rigid . Psychiatric:unable to assess . Neurologic: no seizure no involuntary movements         Lab Data:   Basic Metabolic Panel: Recent Labs  Lab 06/05/18 0714 06/07/18 0745 06/10/18 0616  NA 136 136 135  K 5.0 4.6 4.4  CL 100 102 102  CO2 24 21* 22  GLUCOSE 79 73 84  BUN 22* 28* 25*  CREATININE 8.48* 8.20* 8.40*  CALCIUM 8.6* 8.5* 8.3*  PHOS 5.4* 6.3* 5.5*    ABG: No results for input(s): PHART, PCO2ART, PO2ART, HCO3, O2SAT in the last 168 hours.  Liver Function Tests: Recent Labs  Lab 06/05/18 0714 06/07/18 0745 06/10/18 0616  ALBUMIN 2.0* 2.2* 2.0*   No results for input(s): LIPASE, AMYLASE in the last 168 hours. No results for input(s): AMMONIA in the last 168 hours.  CBC: Recent Labs  Lab 06/05/18 0714 06/07/18 0745 06/10/18 0616  WBC 15.2* 15.4* 15.2*  HGB 6.6* 9.5* 8.8*  HCT 21.9* 31.6* 29.7*  MCV 97.3 94.6 93.4  PLT 399 351 340    Cardiac Enzymes: No results for input(s): CKTOTAL, CKMB,  CKMBINDEX, TROPONINI in the last 168 hours.  BNP (last 3 results) No results for input(s): BNP in the last 8760 hours.  ProBNP (last 3 results) No results for input(s): PROBNP in the last 8760 hours.  Radiological Exams: No results found.  Assessment/Plan Active Problems:   Tracheostomy tube present (HCC)   Stage 5 chronic kidney disease on chronic dialysis (HCC)   Encephalopathy acute   Acute on chronic respiratory failure with hypoxia (HCC)   Healthcare-associated pneumonia   Bleeding at insertion site   1. Acute on chronic respiratory failure with hypoxia we will continue with capping trials continue secretion management pulmonary toilet awaiting clearance to be decannulated 2. Stage V kidney disease patient is on dialysis 3. Encephalopathy resolved 4. Healthcare associated pneumonia treated resolved 5. Bleeding from trach resolved   I have personally seen and evaluated the patient, evaluated laboratory and imaging results, formulated the assessment and plan and placed orders. The Patient requires high complexity decision making for assessment and support.  Case was discussed on Rounds with the Respiratory Therapy Staff  Allyne Gee, MD [facility] Pulmonary Critical Care Medicine Sleep Medicine

## 2018-06-11 NOTE — Progress Notes (Signed)
Pulmonary Critical Care Medicine [place]   PULMONARY CRITICAL CARE SERVICE  PROGRESS NOTE  Date of Service: 06/11/2018  Jimmy Martin  [id number]:[id number]  DOB: 03-Sep-1955   DOA: 04/26/2018  Referring Physician: Merton Border, MD  HPI: Jimmy Martin is a 63 y.o. male seen for follow up of Acute on Chronic Respiratory Failure.  Patient right now is capping on room air awaiting discharge planning for dialysis  Medications: Reviewed on Rounds  Physical Exam:  Vitals: Temperature 98.3 pulse 85 respiratory 21 blood pressure 147/70 saturation 98%  Ventilator Settings capping off the ventilator  . General: Comfortable at this time . Eyes: Grossly normal lids, irises & conjunctiva . ENT: grossly tongue is normal . Neck: no obvious mass . Cardiovascular: S1 S2 normal no gallop . Respiratory: No rhonchi or rales are noted . Abdomen: soft . Skin: no rash seen on limited exam . Musculoskeletal: not rigid . Psychiatric:unable to assess . Neurologic: no seizure no involuntary movements         Lab Data:   Basic Metabolic Panel: Recent Labs  Lab 06/05/18 0714 06/07/18 0745 06/10/18 0616  NA 136 136 135  K 5.0 4.6 4.4  CL 100 102 102  CO2 24 21* 22  GLUCOSE 79 73 84  BUN 22* 28* 25*  CREATININE 8.48* 8.20* 8.40*  CALCIUM 8.6* 8.5* 8.3*  PHOS 5.4* 6.3* 5.5*    ABG: No results for input(s): PHART, PCO2ART, PO2ART, HCO3, O2SAT in the last 168 hours.  Liver Function Tests: Recent Labs  Lab 06/05/18 0714 06/07/18 0745 06/10/18 0616  ALBUMIN 2.0* 2.2* 2.0*   No results for input(s): LIPASE, AMYLASE in the last 168 hours. No results for input(s): AMMONIA in the last 168 hours.  CBC: Recent Labs  Lab 06/05/18 0714 06/07/18 0745 06/10/18 0616  WBC 15.2* 15.4* 15.2*  HGB 6.6* 9.5* 8.8*  HCT 21.9* 31.6* 29.7*  MCV 97.3 94.6 93.4  PLT 399 351 340    Cardiac Enzymes: No results for input(s): CKTOTAL, CKMB, CKMBINDEX, TROPONINI in the last 168  hours.  BNP (last 3 results) No results for input(s): BNP in the last 8760 hours.  ProBNP (last 3 results) No results for input(s): PROBNP in the last 8760 hours.  Radiological Exams: No results found.  Assessment/Plan Active Problems:   Tracheostomy tube present (HCC)   Stage 5 chronic kidney disease on chronic dialysis (HCC)   Encephalopathy acute   Acute on chronic respiratory failure with hypoxia (HCC)   Healthcare-associated pneumonia   Bleeding at insertion site   1. Acute on chronic respiratory failure with hypoxia which is basically resolved right now is just capping not really requiring any oxygen. 2. Stage V kidney disease followed by nephrology for dialysis 3. Encephalopathy grossly unchanged 4. Healthcare associated pneumonia treated 5. Tracheostomy with bleeding has resolved   I have personally seen and evaluated the patient, evaluated laboratory and imaging results, formulated the assessment and plan and placed orders. The Patient requires high complexity decision making for assessment and support.  Case was discussed on Rounds with the Respiratory Therapy Staff  Allyne Gee, MD [facility] Pulmonary Critical Care Medicine Sleep Medicine

## 2018-06-12 LAB — CBC
HCT: 30.4 % — ABNORMAL LOW (ref 39.0–52.0)
HEMOGLOBIN: 9.1 g/dL — AB (ref 13.0–17.0)
MCH: 28.6 pg (ref 26.0–34.0)
MCHC: 29.9 g/dL — ABNORMAL LOW (ref 30.0–36.0)
MCV: 95.6 fL (ref 80.0–100.0)
Platelets: 395 10*3/uL (ref 150–400)
RBC: 3.18 MIL/uL — ABNORMAL LOW (ref 4.22–5.81)
RDW: 15.8 % — ABNORMAL HIGH (ref 11.5–15.5)
WBC: 12.7 10*3/uL — ABNORMAL HIGH (ref 4.0–10.5)
nRBC: 0 % (ref 0.0–0.2)

## 2018-06-12 LAB — RENAL FUNCTION PANEL
Albumin: 2.1 g/dL — ABNORMAL LOW (ref 3.5–5.0)
Anion gap: 13 (ref 5–15)
BUN: 19 mg/dL (ref 6–20)
CO2: 23 mmol/L (ref 22–32)
Calcium: 8.5 mg/dL — ABNORMAL LOW (ref 8.9–10.3)
Chloride: 105 mmol/L (ref 98–111)
Creatinine, Ser: 7.31 mg/dL — ABNORMAL HIGH (ref 0.61–1.24)
GFR calc non Af Amer: 7 mL/min — ABNORMAL LOW (ref >60)
GFR, EST AFRICAN AMERICAN: 9 mL/min — AB (ref >60)
Glucose, Bld: 81 mg/dL (ref 70–99)
Phosphorus: 4.4 mg/dL (ref 2.5–4.6)
Potassium: 4.2 mmol/L (ref 3.5–5.1)
SODIUM: 141 mmol/L (ref 135–145)

## 2018-06-12 NOTE — Progress Notes (Signed)
Pulmonary Critical Care Medicine [place]   PULMONARY CRITICAL CARE SERVICE  PROGRESS NOTE  Date of Service: 06/12/2018  [name]  [id number]  DOB: 1955-06-06   DOA: 04/26/2018  Referring Physician: Merton Border, MD  HPI: Jimmy Martin is a 63 y.o. male seen for follow up of Acute on Chronic Respiratory Failure.  Patient is doing well has been capping on room air without any distress.  Medications: Reviewed on Rounds  Physical Exam:  Vitals: Temperature 97.8 pulse 103 respiratory 24 blood pressure 117/65 saturations 97%  Ventilator Settings currently is off the ventilator on room air capping  . General: Comfortable at this time . Eyes: Grossly normal lids, irises & conjunctiva . ENT: grossly tongue is normal . Neck: no obvious mass . Cardiovascular: S1 S2 normal no gallop . Respiratory: No rhonchi or rales are noted . Abdomen: soft . Skin: no rash seen on limited exam . Musculoskeletal: not rigid . Psychiatric:unable to assess . Neurologic: no seizure no involuntary movements         Lab Data:   Basic Metabolic Panel: Recent Labs  Lab 06/07/18 0745 06/10/18 0616 06/12/18 0548  NA 136 135 141  K 4.6 4.4 4.2  CL 102 102 105  CO2 21* 22 23  GLUCOSE 73 84 81  BUN 28* 25* 19  CREATININE 8.20* 8.40* 7.31*  CALCIUM 8.5* 8.3* 8.5*  PHOS 6.3* 5.5* 4.4    ABG: No results for input(s): PHART, PCO2ART, PO2ART, HCO3, O2SAT in the last 168 hours.  Liver Function Tests: Recent Labs  Lab 06/07/18 0745 06/10/18 0616 06/12/18 0548  ALBUMIN 2.2* 2.0* 2.1*   No results for input(s): LIPASE, AMYLASE in the last 168 hours. No results for input(s): AMMONIA in the last 168 hours.  CBC: Recent Labs  Lab 06/07/18 0745 06/10/18 0616 06/12/18 0548  WBC 15.4* 15.2* 12.7*  HGB 9.5* 8.8* 9.1*  HCT 31.6* 29.7* 30.4*  MCV 94.6 93.4 95.6  PLT 351 340 395    Cardiac Enzymes: No results for input(s): CKTOTAL, CKMB, CKMBINDEX, TROPONINI in  the last 168 hours.  BNP (last 3 results) No results for input(s): BNP in the last 8760 hours.  ProBNP (last 3 results) No results for input(s): PROBNP in the last 8760 hours.  Radiological Exams: No results found.  Assessment/Plan Active Problems:   Tracheostomy tube present (HCC)   Stage 5 chronic kidney disease on chronic dialysis (HCC)   Encephalopathy acute   Acute on chronic respiratory failure with hypoxia (HCC)   Healthcare-associated pneumonia   Bleeding at insertion site   1. Acute on chronic respiratory failure with hypoxia plan is for discharge today patient is going to go home with the cath in place.  He will follow-up in the tracheostomy clinic 2. Stage V chronic kidney disease at baseline continue with supportive care 3. Acute encephalopathy at baseline 4. Healthcare associated pneumonia treated next leading resolved   I have personally seen and evaluated the patient, evaluated laboratory and imaging results, formulated the assessment and plan and placed orders. The Patient requires high complexity decision making for assessment and support.  Case was discussed on Rounds with the Respiratory Therapy Staff  Allyne Gee, MD [facility] Pulmonary Critical Care Medicine Sleep Medicine

## 2018-06-12 NOTE — Progress Notes (Signed)
[facility]  ROUNDING NOTE   Subjective:  Patient seen and evaluated during hemodialysis. Tolerating well. It appears that a dialysis clinic associated with [facility] has accepted the patient.  Objective:  Vital signs in last 24 hours:  Temperature 97.8 pulse 103 respirations 24 blood pressure 117/65  Physical Exam: General: No acute distress  Head: Normocephalic, atraumatic. Moist oral mucosal membranes  Eyes: Anicteric  Neck: Tracheostomy in place   Lungs:  Scattered rhonchi, normal effort  Heart: S1S2 no rubs  Abdomen:  Soft, nontender, bowel sounds present, PD catheter in place  Extremities: trace peripheral edema.  Neurologic: Awake, alert, follows commands  Skin: No lesions  Access: R IJ PC    Basic Metabolic Panel: Recent Labs  Lab 06/07/18 0745 06/10/18 0616 06/12/18 0548  NA 136 135 141  K 4.6 4.4 4.2  CL 102 102 105  CO2 21* 22 23  GLUCOSE 73 84 81  BUN 28* 25* 19  CREATININE 8.20* 8.40* 7.31*  CALCIUM 8.5* 8.3* 8.5*  PHOS 6.3* 5.5* 4.4    Liver Function Tests: Recent Labs  Lab 06/07/18 0745 06/10/18 0616 06/12/18 0548  ALBUMIN 2.2* 2.0* 2.1*   No results for input(s): LIPASE, AMYLASE in the last 168 hours. No results for input(s): AMMONIA in the last 168 hours.  CBC: Recent Labs  Lab 06/07/18 0745 06/10/18 0616 06/12/18 0548  WBC 15.4* 15.2* 12.7*  HGB 9.5* 8.8* 9.1*  HCT 31.6* 29.7* 30.4*  MCV 94.6 93.4 95.6  PLT 351 340 395    Cardiac Enzymes: No results for input(s): CKTOTAL, CKMB, CKMBINDEX, TROPONINI in the last 168 hours.  BNP: Invalid input(s): POCBNP  CBG: No results for input(s): GLUCAP in the last 168 hours.  Microbiology: Results for orders placed or performed during the hospital encounter of 04/26/18  Culture, respiratory     Status: None   Collection Time: 05/01/18  3:34 PM  Result Value Ref Range Status   Specimen Description TRACHEAL ASPIRATE  Final   Special Requests NONE  Final   Gram Stain   Final     MODERATE WBC PRESENT,BOTH PMN AND MONONUCLEAR MODERATE GRAM POSITIVE COCCI IN PAIRS FEW GRAM NEGATIVE RODS MODERATE GRAM POSITIVE RODS RARE SQUAMOUS EPITHELIAL CELLS PRESENT Performed at [facility], [place] [address] [place], [place] [address]    Culture FEW METHICILLIN RESISTANT STAPHYLOCOCCUS AUREUS  Final   Report Status 05/04/2018 FINAL  Final   Organism ID, Bacteria METHICILLIN RESISTANT STAPHYLOCOCCUS AUREUS  Final      Susceptibility   Methicillin resistant staphylococcus aureus - MIC*    CIPROFLOXACIN >=8 RESISTANT Resistant     ERYTHROMYCIN <=0.25 SENSITIVE Sensitive     GENTAMICIN <=0.5 SENSITIVE Sensitive     OXACILLIN >=4 RESISTANT Resistant     TETRACYCLINE <=1 SENSITIVE Sensitive     VANCOMYCIN <=0.5 SENSITIVE Sensitive     TRIMETH/SULFA <=10 SENSITIVE Sensitive     CLINDAMYCIN <=0.25 SENSITIVE Sensitive     RIFAMPIN <=0.5 SENSITIVE Sensitive     Inducible Clindamycin NEGATIVE Sensitive     * FEW METHICILLIN RESISTANT STAPHYLOCOCCUS AUREUS  Culture, respiratory (non-expectorated)     Status: None   Collection Time: 05/04/18  2:27 PM  Result Value Ref Range Status   Specimen Description TRACHEAL ASPIRATE  Final   Special Requests NONE  Final   Gram Stain   Final    FEW WBC PRESENT, PREDOMINANTLY PMN FEW GRAM POSITIVE COCCI    Culture   Final    MODERATE METHICILLIN RESISTANT  STAPHYLOCOCCUS AUREUS   Report Status 05/06/2018 FINAL  Final   Organism ID, Bacteria METHICILLIN RESISTANT STAPHYLOCOCCUS AUREUS  Final      Susceptibility   Methicillin resistant staphylococcus aureus - MIC*    CIPROFLOXACIN >=8 RESISTANT Resistant     ERYTHROMYCIN <=0.25 SENSITIVE Sensitive     GENTAMICIN <=0.5 SENSITIVE Sensitive     OXACILLIN >=4 RESISTANT Resistant     TETRACYCLINE <=1 SENSITIVE Sensitive     VANCOMYCIN <=0.5 SENSITIVE Sensitive     TRIMETH/SULFA <=10 SENSITIVE Sensitive     CLINDAMYCIN <=0.25 SENSITIVE Sensitive     RIFAMPIN <=0.5 SENSITIVE Sensitive      Inducible Clindamycin NEGATIVE Sensitive     * MODERATE METHICILLIN RESISTANT STAPHYLOCOCCUS AUREUS  C difficile quick scan w PCR reflex     Status: None   Collection Time: 05/10/18 10:48 AM  Result Value Ref Range Status   C Diff antigen NEGATIVE NEGATIVE Final   C Diff toxin NEGATIVE NEGATIVE Final   C Diff interpretation No C. difficile detected.  Final    Comment: Performed at [facility], [place] [address], [place], [place] [address]  Culture, respiratory (non-expectorated)     Status: None   Collection Time: 05/18/18  1:25 PM  Result Value Ref Range Status   Specimen Description TRACHEAL ASPIRATE  Final   Special Requests NONE  Final   Gram Stain   Final    ABUNDANT WBC PRESENT, PREDOMINANTLY PMN MODERATE GRAM NEGATIVE RODS RARE GRAM POSITIVE RODS RARE SQUAMOUS EPITHELIAL CELLS PRESENT    Culture   Final    ABUNDANT HAEMOPHILUS PARAINFLUENZAE BETA LACTAMASE NEGATIVE Performed at [facility], [place] [address], [place], [place] [address]    Report Status 05/21/2018 FINAL  Final    Coagulation Studies: No results for input(s): LABPROT, INR in the last 72 hours.  Urinalysis: No results for input(s): COLORURINE, LABSPEC, PHURINE, GLUCOSEU, HGBUR, BILIRUBINUR, KETONESUR, PROTEINUR, UROBILINOGEN, NITRITE, LEUKOCYTESUR in the last 72 hours.  Invalid input(s): APPERANCEUR    Imaging: No results found.   Medications:       Assessment/ Plan:  63 y.o. 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  1.  ESRD on HD.    Patient seen and evaluated during hemodialysis and tolerating quite well.  It appears that a clinic associated with [facility] has accepted the patient.  Patient  was previously on peritoneal dialysis and his PD catheter is still in place.  Hopefully as he regains strength he may be able to go back to peritoneal dialysis.  For now he will benefit from continued hemodialysis.  2.  Acute respiratory failure.    Patient doing well with Tracheostomy.  Recommend follow-up with pulmonary medicine when he goes back to [place].  3.  Secondary hyperparathyroidism.    Phosphorus under good control at 4.4.  Continue to monitor calcium, phosphorus, and PTH as an outpatient.  4.  Anemia of chronic kidney disease.  Hemoglobin up to 9.1.  Recommend continued monitoring of Hgb as outpt.   LOS: 0 Eulamae Greenstein 1/15/20203:12 PM

## 2019-08-21 IMAGING — DX DG ABD PORTABLE 1V
1 series · 1 of 1 positions shown · non-contrast
Comparison: None.

CLINICAL DATA: Dialysis catheter.

EXAM:
PORTABLE ABDOMEN - 1 VIEW; PORTABLE CHEST - 1 VIEW

[abdomen]
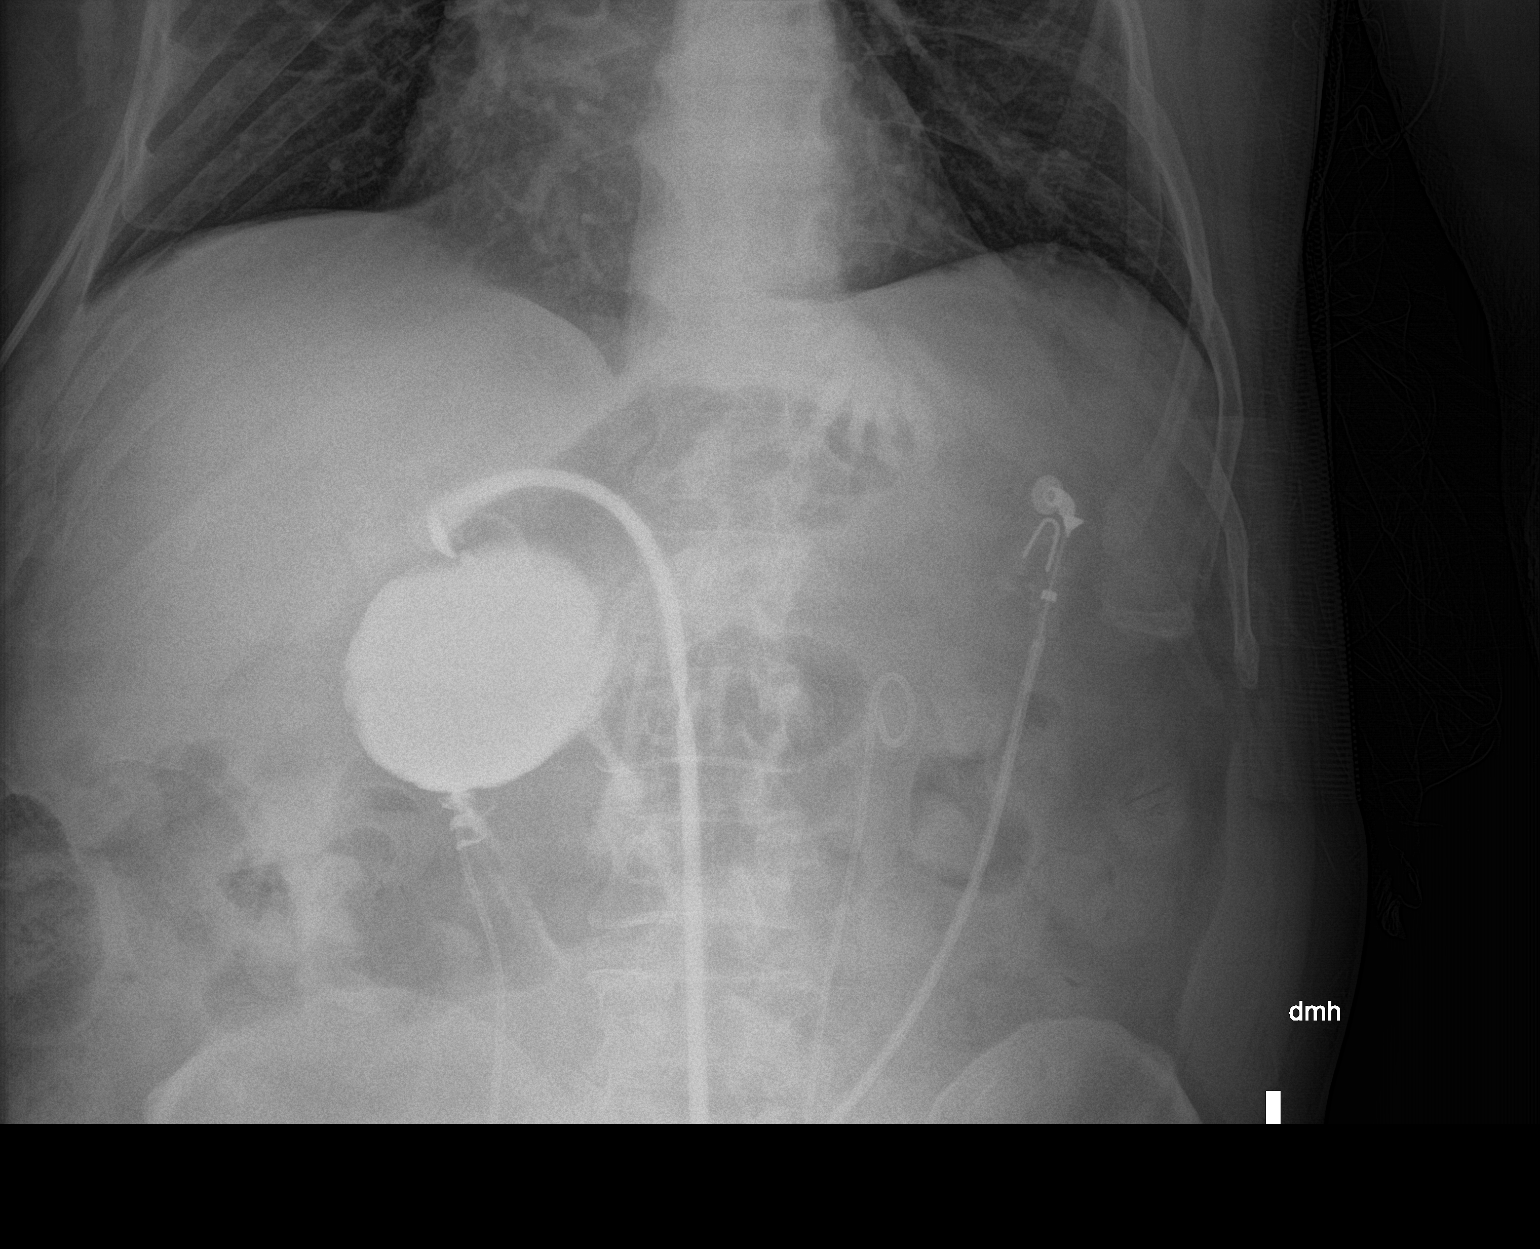

[1 of 1 positions shown; findings below may reference images not displayed]

FINDINGS: Cardiomediastinal silhouette is normal. Mildly calcified aortic
arch. No pleural effusions or focal consolidations. Tunneled
dialysis catheter via RIGHT internal jugular venous approach with
distal tip projecting in distal superior vena cava. Tiny suspected
granulomas. Trachea projects midline and there is no pneumothorax.
Soft tissue planes and included osseous structures are
non-suspicious.

Contrast injected into gastrostomy tube with contrast distended
gastric antrum, no extraluminal contrast. Included bowel gas pattern
is nondilated and nonobstructive. Bilateral nephroureteral stents.
No mass effect or nephrolithiasis. Included osseous structures are
non suspicious.
IMPRESSION: 1. No acute cardiopulmonary process. Dialysis catheter tip projects
in distal superior vena cava.
2. Intraluminal gastrostomy tube.  Bilateral nephroureteral stents.

## 2019-08-28 IMAGING — DX DG ABDOMEN 1V
1 series · 1 of 1 positions shown · non-contrast
Comparison: Prior radiograph 04/22/2018

CLINICAL DATA: Initial evaluation for PEG tube placement

EXAM:
ABDOMEN - 1 VIEW

[abdomen]
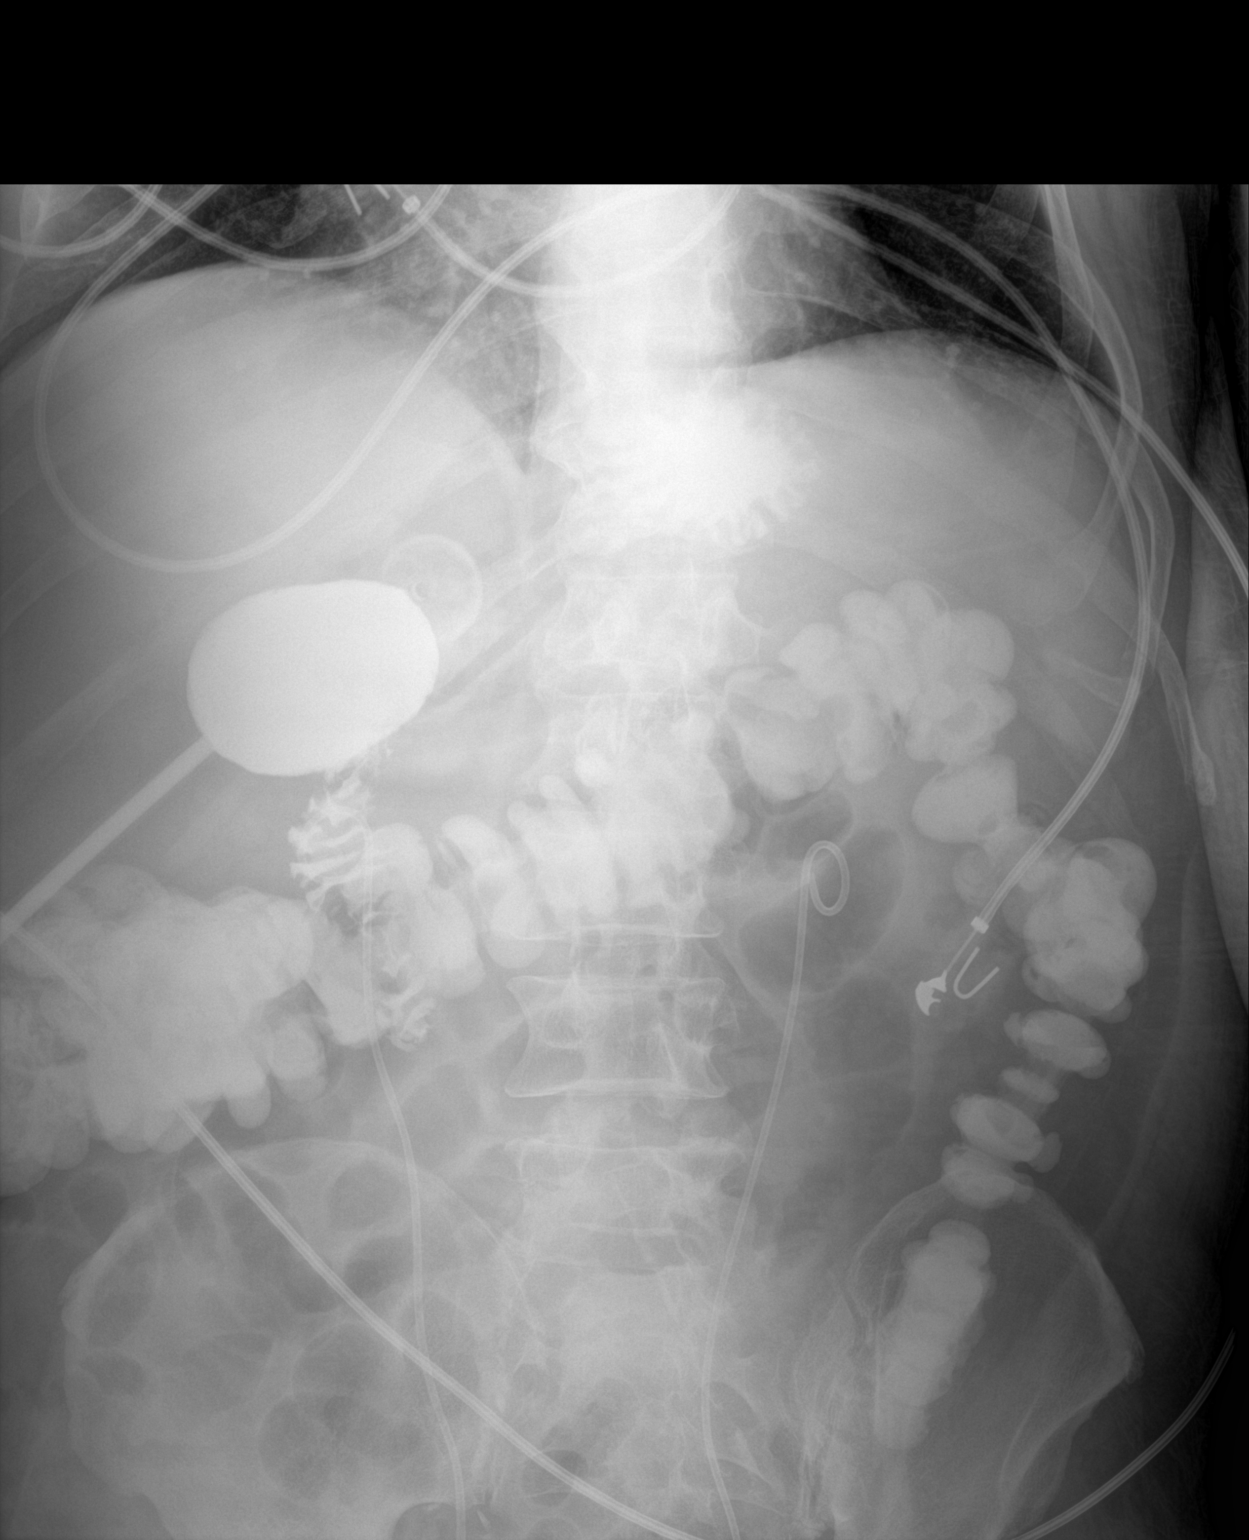

[1 of 1 positions shown; findings below may reference images not displayed]

FINDINGS: Contrast material has been instilled via a percutaneous gastrostomy
tube. Contrast seen pooling in presumably the distal stomach at the
level of the gastric antrum, around the insertion of the gastrostomy
tube. Flow contrast seen extending into the second portion of the
duodenum. No extraluminal contrast to suggest leakage or
malpositioning. Additional contrast material noted within the
visualized colon. Double-J ureteral stents in place. Bowel gas
pattern is nonobstructive.
IMPRESSION: Percutaneous gastrostomy tube in place within the distal stomach. No
extraluminal contrast to suggest malpositioning or leak.

## 2019-09-06 IMAGING — DX DG ABD PORTABLE 1V
1 series · 2 of 2 positions shown · non-contrast
Comparison: 04/26/2018

CLINICAL DATA: Assess for ileus

EXAM:
PORTABLE ABDOMEN - 1 VIEW

[Series 1: abdomen · 0.14mm/px · 2 of 2 slices shown]
[im 1/2]
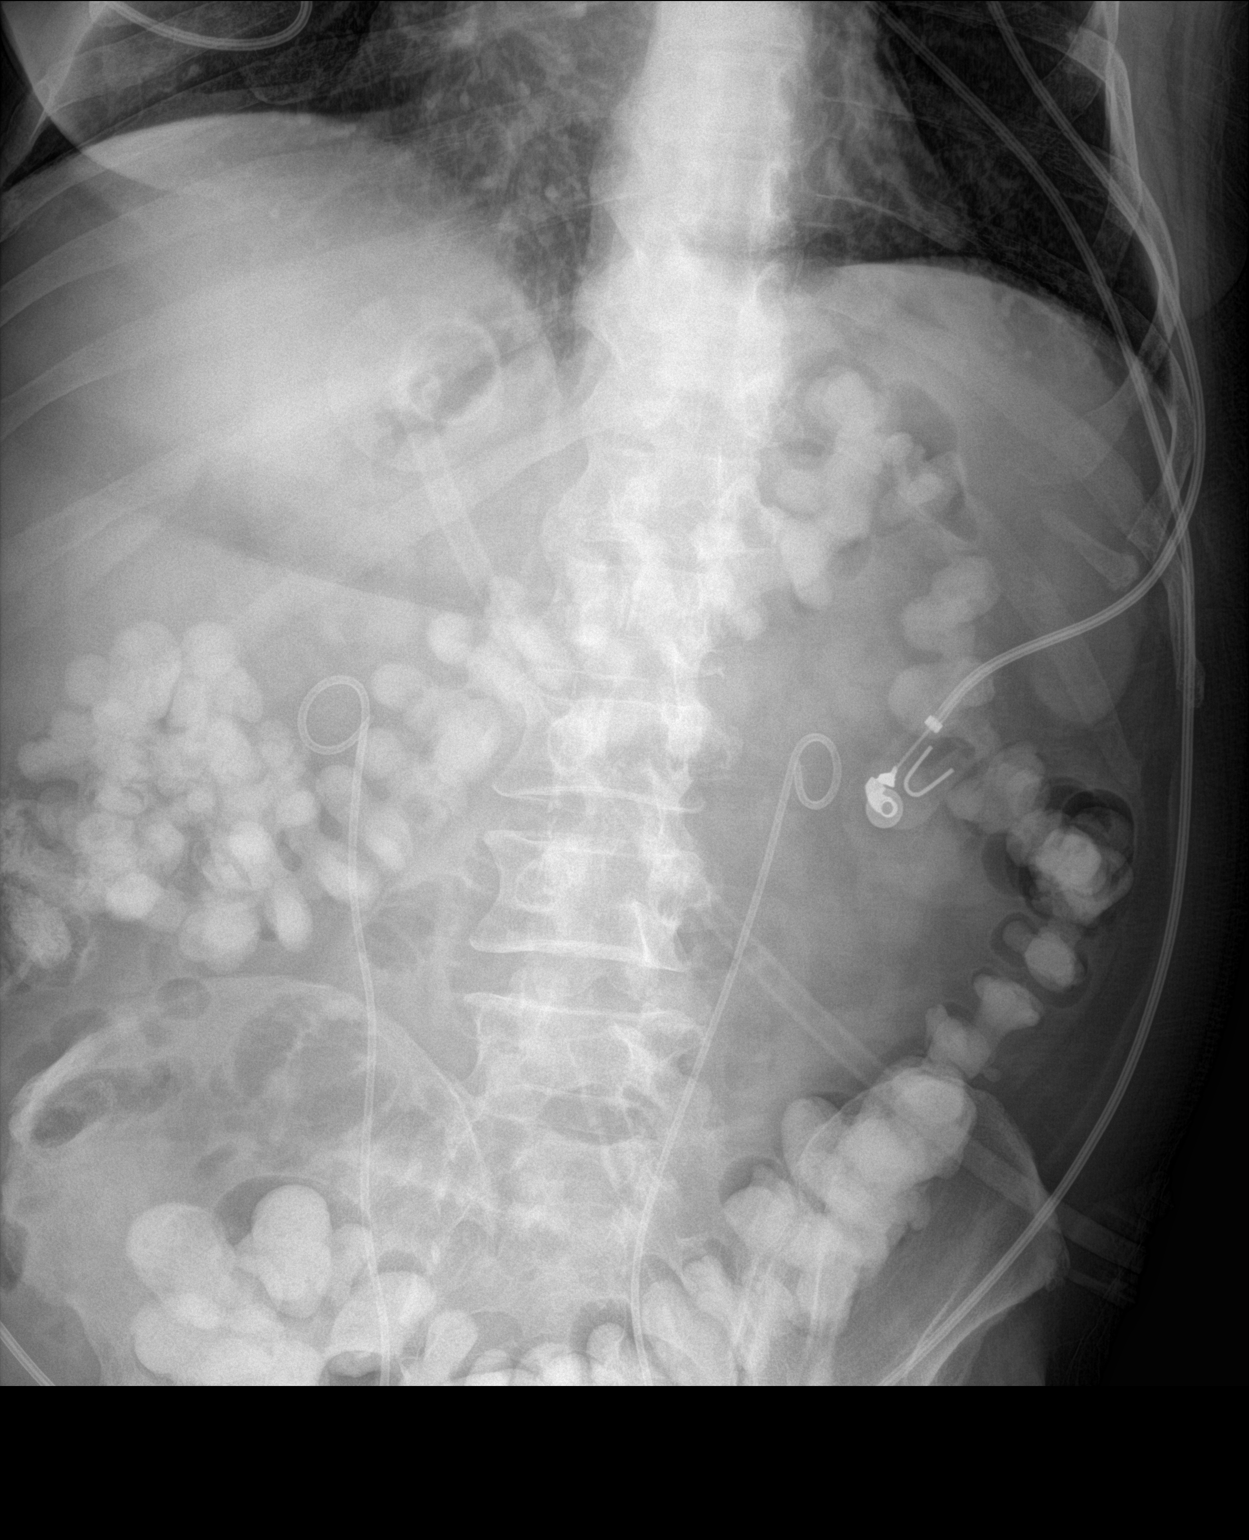
[im 2/2]
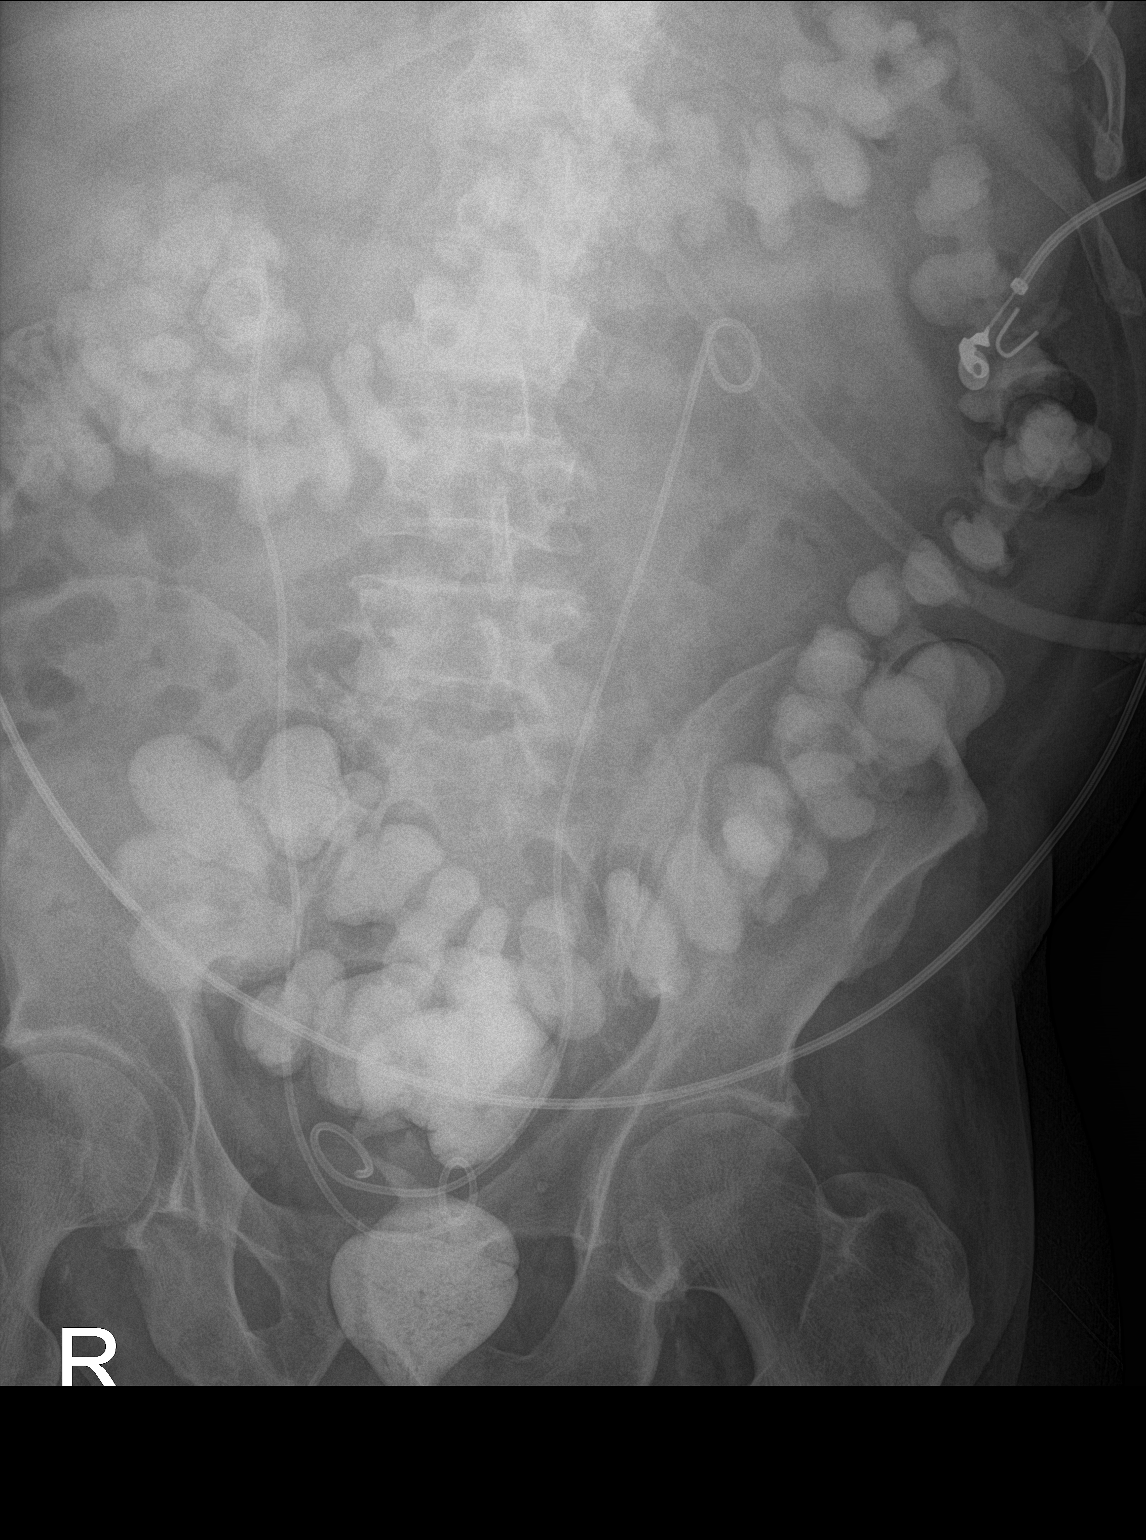

[2 of 2 positions shown; findings below may reference images not displayed]

FINDINGS: Bilateral double-J ureteral stents in place. Previously administered
contrast is present throughout the colon. Small bowel gas pattern is
normal. Gastrostomy tube in place.
IMPRESSION: Previously administered contrast is present throughout the colon.
Small bowel gas pattern is normal.

## 2020-01-28 DEATH — deceased
